# Patient Record
Sex: Female | Born: 1945 | Race: Black or African American | Hispanic: No | Smoking: Never smoker
Health system: Southern US, Community
[De-identification: ages and names within clinical notes are randomized; demographics above are authoritative.]

## PROBLEM LIST (undated history)

## (undated) DIAGNOSIS — I10 Essential (primary) hypertension: Secondary | ICD-10-CM

## (undated) DIAGNOSIS — R519 Headache, unspecified: Secondary | ICD-10-CM

## (undated) DIAGNOSIS — E785 Hyperlipidemia, unspecified: Secondary | ICD-10-CM

## (undated) DIAGNOSIS — K219 Gastro-esophageal reflux disease without esophagitis: Secondary | ICD-10-CM

## (undated) DIAGNOSIS — E119 Type 2 diabetes mellitus without complications: Secondary | ICD-10-CM

## (undated) DIAGNOSIS — E7800 Pure hypercholesterolemia, unspecified: Secondary | ICD-10-CM

## (undated) DIAGNOSIS — D649 Anemia, unspecified: Secondary | ICD-10-CM

## (undated) DIAGNOSIS — R011 Cardiac murmur, unspecified: Secondary | ICD-10-CM

## (undated) DIAGNOSIS — L02419 Cutaneous abscess of limb, unspecified: Secondary | ICD-10-CM

## (undated) DIAGNOSIS — R51 Headache: Secondary | ICD-10-CM

## (undated) DIAGNOSIS — L03119 Cellulitis of unspecified part of limb: Secondary | ICD-10-CM

## (undated) HISTORY — DX: Cellulitis of unspecified part of limb: L03.119

## (undated) HISTORY — PX: EYE SURGERY: SHX253

## (undated) HISTORY — DX: Hyperlipidemia, unspecified: E78.5

## (undated) HISTORY — DX: Pure hypercholesterolemia, unspecified: E78.00

## (undated) HISTORY — DX: Cellulitis of unspecified part of limb: L02.419

## (undated) HISTORY — DX: Headache, unspecified: R51.9

## (undated) HISTORY — DX: Cardiac murmur, unspecified: R01.1

## (undated) HISTORY — DX: Essential (primary) hypertension: I10

## (undated) HISTORY — DX: Anemia, unspecified: D64.9

## (undated) HISTORY — DX: Headache: R51

---

## 2001-01-06 ENCOUNTER — Ambulatory Visit (HOSPITAL_COMMUNITY): Admission: RE | Admit: 2001-01-06 | Discharge: 2001-01-06 | Payer: Self-pay | Admitting: Family Medicine

## 2001-01-06 ENCOUNTER — Encounter: Payer: Self-pay | Admitting: Family Medicine

## 2001-01-23 ENCOUNTER — Encounter (HOSPITAL_COMMUNITY): Admission: RE | Admit: 2001-01-23 | Discharge: 2001-02-22 | Payer: Self-pay | Admitting: Neurosurgery

## 2001-02-23 ENCOUNTER — Encounter (HOSPITAL_COMMUNITY): Admission: RE | Admit: 2001-02-23 | Discharge: 2001-03-25 | Payer: Self-pay | Admitting: Neurosurgery

## 2001-06-12 ENCOUNTER — Encounter: Payer: Self-pay | Admitting: Family Medicine

## 2001-06-12 ENCOUNTER — Ambulatory Visit (HOSPITAL_COMMUNITY): Admission: RE | Admit: 2001-06-12 | Discharge: 2001-06-12 | Payer: Self-pay | Admitting: Family Medicine

## 2002-05-14 ENCOUNTER — Emergency Department (HOSPITAL_COMMUNITY): Admission: EM | Admit: 2002-05-14 | Discharge: 2002-05-14 | Payer: Self-pay | Admitting: Emergency Medicine

## 2002-12-10 ENCOUNTER — Emergency Department (HOSPITAL_COMMUNITY): Admission: EM | Admit: 2002-12-10 | Discharge: 2002-12-10 | Payer: Self-pay | Admitting: Emergency Medicine

## 2004-08-29 ENCOUNTER — Emergency Department (HOSPITAL_COMMUNITY): Admission: EM | Admit: 2004-08-29 | Discharge: 2004-08-29 | Payer: Self-pay | Admitting: Emergency Medicine

## 2006-04-13 ENCOUNTER — Ambulatory Visit: Payer: Self-pay | Admitting: Gastroenterology

## 2006-04-13 ENCOUNTER — Ambulatory Visit (HOSPITAL_COMMUNITY): Admission: RE | Admit: 2006-04-13 | Discharge: 2006-04-13 | Payer: Self-pay | Admitting: Gastroenterology

## 2006-07-01 ENCOUNTER — Emergency Department (HOSPITAL_COMMUNITY): Admission: EM | Admit: 2006-07-01 | Discharge: 2006-07-01 | Payer: Self-pay | Admitting: Emergency Medicine

## 2007-07-10 ENCOUNTER — Emergency Department (HOSPITAL_COMMUNITY): Admission: EM | Admit: 2007-07-10 | Discharge: 2007-07-11 | Payer: Self-pay | Admitting: Emergency Medicine

## 2008-02-20 ENCOUNTER — Ambulatory Visit (HOSPITAL_COMMUNITY): Admission: RE | Admit: 2008-02-20 | Discharge: 2008-02-20 | Payer: Self-pay | Admitting: Family Medicine

## 2008-11-04 ENCOUNTER — Ambulatory Visit (HOSPITAL_COMMUNITY): Admission: RE | Admit: 2008-11-04 | Discharge: 2008-11-04 | Payer: Self-pay | Admitting: Family Medicine

## 2008-11-22 ENCOUNTER — Other Ambulatory Visit: Admission: RE | Admit: 2008-11-22 | Discharge: 2008-11-22 | Payer: Self-pay | Admitting: Obstetrics & Gynecology

## 2009-04-21 ENCOUNTER — Ambulatory Visit: Payer: Self-pay | Admitting: Orthopedic Surgery

## 2009-06-10 ENCOUNTER — Ambulatory Visit: Payer: Self-pay | Admitting: Orthopedic Surgery

## 2009-06-11 ENCOUNTER — Encounter (INDEPENDENT_AMBULATORY_CARE_PROVIDER_SITE_OTHER): Payer: Self-pay | Admitting: *Deleted

## 2009-06-27 ENCOUNTER — Ambulatory Visit: Payer: Self-pay | Admitting: Gastroenterology

## 2009-06-27 ENCOUNTER — Encounter: Payer: Self-pay | Admitting: Urgent Care

## 2009-06-27 DIAGNOSIS — D508 Other iron deficiency anemias: Secondary | ICD-10-CM | POA: Insufficient documentation

## 2009-06-27 DIAGNOSIS — E785 Hyperlipidemia, unspecified: Secondary | ICD-10-CM | POA: Insufficient documentation

## 2009-06-27 DIAGNOSIS — E119 Type 2 diabetes mellitus without complications: Secondary | ICD-10-CM

## 2009-06-27 DIAGNOSIS — D259 Leiomyoma of uterus, unspecified: Secondary | ICD-10-CM | POA: Insufficient documentation

## 2009-06-27 DIAGNOSIS — K219 Gastro-esophageal reflux disease without esophagitis: Secondary | ICD-10-CM

## 2009-06-27 LAB — CONVERTED CEMR LAB
Basophils Absolute: 0 10*3/uL (ref 0.0–0.1)
Basophils Relative: 0 % (ref 0–1)
Eosinophils Absolute: 0.1 10*3/uL (ref 0.0–0.7)
Eosinophils Relative: 2 % (ref 0–5)
Hemoglobin: 11 g/dL — ABNORMAL LOW (ref 12.0–15.0)
MCHC: 30.4 g/dL (ref 30.0–36.0)
MCV: 75.4 fL — ABNORMAL LOW (ref 78.0–100.0)
Monocytes Absolute: 0.5 10*3/uL (ref 0.1–1.0)
Monocytes Relative: 8 % (ref 3–12)
Neutro Abs: 3.9 10*3/uL (ref 1.7–7.7)
RDW: 14.7 % (ref 11.5–15.5)
Retic Ct Pct: 1 % (ref 0.4–3.1)
TIBC: 262 ug/dL (ref 250–470)

## 2009-06-30 ENCOUNTER — Ambulatory Visit: Payer: Self-pay | Admitting: Gastroenterology

## 2009-07-11 ENCOUNTER — Encounter: Payer: Self-pay | Admitting: Gastroenterology

## 2009-08-30 HISTORY — PX: OTHER SURGICAL HISTORY: SHX169

## 2009-09-29 ENCOUNTER — Ambulatory Visit: Payer: Self-pay | Admitting: Orthopedic Surgery

## 2009-09-29 DIAGNOSIS — M7580 Other shoulder lesions, unspecified shoulder: Secondary | ICD-10-CM

## 2009-09-29 DIAGNOSIS — M75120 Complete rotator cuff tear or rupture of unspecified shoulder, not specified as traumatic: Secondary | ICD-10-CM | POA: Insufficient documentation

## 2009-10-02 ENCOUNTER — Telehealth (INDEPENDENT_AMBULATORY_CARE_PROVIDER_SITE_OTHER): Payer: Self-pay | Admitting: *Deleted

## 2009-10-03 ENCOUNTER — Telehealth (INDEPENDENT_AMBULATORY_CARE_PROVIDER_SITE_OTHER): Payer: Self-pay | Admitting: *Deleted

## 2009-10-03 ENCOUNTER — Ambulatory Visit (HOSPITAL_COMMUNITY): Admission: RE | Admit: 2009-10-03 | Discharge: 2009-10-03 | Payer: Self-pay | Admitting: Orthopedic Surgery

## 2009-10-22 ENCOUNTER — Ambulatory Visit: Payer: Self-pay | Admitting: Orthopedic Surgery

## 2009-10-24 ENCOUNTER — Encounter (INDEPENDENT_AMBULATORY_CARE_PROVIDER_SITE_OTHER): Payer: Self-pay | Admitting: *Deleted

## 2009-10-31 ENCOUNTER — Ambulatory Visit: Payer: Self-pay | Admitting: Orthopedic Surgery

## 2009-10-31 ENCOUNTER — Inpatient Hospital Stay (HOSPITAL_COMMUNITY)
Admission: RE | Admit: 2009-10-31 | Discharge: 2009-11-02 | Payer: Self-pay | Source: Home / Self Care | Admitting: Orthopedic Surgery

## 2009-11-04 ENCOUNTER — Ambulatory Visit: Payer: Self-pay | Admitting: Orthopedic Surgery

## 2009-11-07 ENCOUNTER — Encounter: Payer: Self-pay | Admitting: Orthopedic Surgery

## 2009-11-12 ENCOUNTER — Ambulatory Visit: Payer: Self-pay | Admitting: Orthopedic Surgery

## 2009-11-14 ENCOUNTER — Encounter: Payer: Self-pay | Admitting: Orthopedic Surgery

## 2009-12-11 ENCOUNTER — Ambulatory Visit: Payer: Self-pay | Admitting: Orthopedic Surgery

## 2009-12-15 ENCOUNTER — Encounter (HOSPITAL_COMMUNITY): Admission: RE | Admit: 2009-12-15 | Discharge: 2010-01-14 | Payer: Self-pay | Admitting: Orthopedic Surgery

## 2009-12-15 ENCOUNTER — Encounter: Payer: Self-pay | Admitting: Orthopedic Surgery

## 2010-01-07 ENCOUNTER — Encounter: Payer: Self-pay | Admitting: Orthopedic Surgery

## 2010-01-08 ENCOUNTER — Ambulatory Visit: Payer: Self-pay | Admitting: Orthopedic Surgery

## 2010-01-15 ENCOUNTER — Encounter (HOSPITAL_COMMUNITY): Admission: RE | Admit: 2010-01-15 | Discharge: 2010-02-14 | Payer: Self-pay | Admitting: Orthopedic Surgery

## 2010-02-03 ENCOUNTER — Telehealth: Payer: Self-pay | Admitting: Orthopedic Surgery

## 2010-02-04 ENCOUNTER — Telehealth: Payer: Self-pay | Admitting: Orthopedic Surgery

## 2010-02-04 ENCOUNTER — Encounter: Payer: Self-pay | Admitting: Orthopedic Surgery

## 2010-02-16 ENCOUNTER — Encounter (HOSPITAL_COMMUNITY): Admission: RE | Admit: 2010-02-16 | Discharge: 2010-03-18 | Payer: Self-pay | Admitting: Orthopedic Surgery

## 2010-02-25 ENCOUNTER — Encounter: Payer: Self-pay | Admitting: Orthopedic Surgery

## 2010-02-26 ENCOUNTER — Ambulatory Visit: Payer: Self-pay | Admitting: Orthopedic Surgery

## 2010-03-19 ENCOUNTER — Encounter: Payer: Self-pay | Admitting: Orthopedic Surgery

## 2010-03-19 ENCOUNTER — Encounter (HOSPITAL_COMMUNITY): Admission: RE | Admit: 2010-03-19 | Discharge: 2010-04-18 | Payer: Self-pay | Admitting: Orthopedic Surgery

## 2010-04-02 ENCOUNTER — Ambulatory Visit: Payer: Self-pay | Admitting: Orthopedic Surgery

## 2010-05-25 ENCOUNTER — Emergency Department (HOSPITAL_COMMUNITY): Admission: EM | Admit: 2010-05-25 | Discharge: 2010-05-25 | Payer: Self-pay | Admitting: Emergency Medicine

## 2010-05-27 ENCOUNTER — Other Ambulatory Visit: Admission: RE | Admit: 2010-05-27 | Discharge: 2010-05-27 | Payer: Self-pay | Admitting: Obstetrics & Gynecology

## 2010-07-02 ENCOUNTER — Ambulatory Visit: Payer: Self-pay | Admitting: Orthopedic Surgery

## 2010-08-10 ENCOUNTER — Ambulatory Visit (HOSPITAL_COMMUNITY)
Admission: RE | Admit: 2010-08-10 | Discharge: 2010-08-10 | Payer: Self-pay | Source: Home / Self Care | Attending: Internal Medicine | Admitting: Internal Medicine

## 2010-09-29 NOTE — Assessment & Plan Note (Signed)
Summary: MRI RESULTS DISC IS HERE/BCBS/BSF   Visit Type:  Follow-up Referring Provider:  Dr. Lynett Fish Primary Provider:  [name]  CC:  right shoulder pain.  History of Present Illness: This is a follow up visit for this 65 year old right-hand-dominant female who had an injection ;. home exercise program which she has been done diligently, presents back with pain in the RIGHT shoulder with an aching sensation, painful for evaluation, weakness, and a catching sensation in the RIGHT shoulder.  Review of systems she has no neck pain or paresthesias in the RIGHT upper extremity.  MRI results  IMPRESSION:   1.  Large full-thickness retracted tear of the supraspinatus tendon. 2.  Intrasubstance tears of the musculotendinous junctions of the infraspinatus and supraspinatus. 3.  Glenohumeral joint effusion with fluid extending into the subacromial and subdeltoid bursal spaces.   Read By:  Gwynn Burly,  M.D.       Current Medications (verified): 1)  Crestor 10 Mg Tabs (Rosuvastatin Calcium) .... Take 1 Tab By Mouth At Bedtime 2)  Lisinopril 10 Mg Tabs (Lisinopril) .... Take 1 Tablet By Mouth Once A Day 3)  Ibuprofen 200 Mg Tabs (Ibuprofen) .... 2 By Mouth As Needed Usu Once Per Week 4)  Omeprazole 20 Mg Tbec (Omeprazole) .... One By Mouth Daily For Acid Reflux 5)  Norco 5-325 Mg Tabs (Hydrocodone-Acetaminophen) .Marland Kitchen.. 1 By Mouth Q 4 As Needed Pain  Allergies (verified): No Known Drug Allergies  Past History:  Past Medical History: Last updated: 06/27/2009 htn Hyperlipidemia Colonoscopy Dr. Cira Servant 04/13/2006->66mm asc colon polypectomy, unable to be retrieved, recommends repeat in 5 yrs DM ANEMIA, IRON DEFICIENCY, MICROCYTIC (ICD-280.8)  Uterine fibroids Dr Despina Hidden  Past Surgical History: Last updated: 06/27/2009 na Unremarkable  Family History: Last updated: 06/27/2009 No known family history of colorectal carcinoma, IBD, liver or chronic GI  problems. 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  Alcohol Use - no Illicit Drug Use - no Patient does not get regular exercise.   Risk Factors: Alcohol Use: 0 (04/21/2009) Caffeine Use: 1 cup per day (04/21/2009) Exercise: no (06/27/2009)  Risk Factors: Smoking Status: never (06/27/2009)  Review of Systems General:  Denies weight loss, weight gain, fever, chills, and fatigue. Cardiac :  Denies chest pain, angina, heart attack, heart failure, poor circulation, blood clots, and phlebitis. Resp:  Denies short of breath, difficulty breathing, COPD, cough, and pneumonia. GI:  Denies nausea, vomiting, diarrhea, constipation, difficulty swallowing, ulcers, GERD, and reflux. GU:  Denies kidney failure, kidney transplant, kidney stones, burning, poor stream, testicular cancer, blood in urine, and . Neuro:  Denies headache, dizziness, migraines, numbness, weakness, tremor, and unsteady walking. MS:  See HPI. Endo:  Denies thyroid disease, goiter, and diabetes. Psych:  Denies depression, mood swings, anxiety, panic attack, bipolar, and schizophrenia. Derm:  Denies eczema, cancer, and itching. EENT:  Denies poor vision, cataracts, glaucoma, poor hearing, vertigo, ears ringing, sinusitis, hoarseness, toothaches, and bleeding gums. Immunology:  has been worked up for anemia with Dr. Aura Fey grp. Lymphatic:  Denies lymph node cancer and lymph edema.  Physical Exam  Additional Exam:  PHYSICAL EXAMINATION    GENERAL Overall appearance was normal MEDIUM BUILD   NEURO: she was oriented times x 3 and her mood was flat; Sensation is normal.  Reflexes normal.  Coordination and balance were normal.  LYMPH NODES WERE NORMAL   Gait and station were  normal.    Her RIGHT upper extremity was nontender but had painful range of motion especially in forward elevation abduction.  The shoulder was stable.  She had weakness of the supraspinous tendon.  Internal/external rotators were normal.    Skin is intact.    CDV: Pulses were normal.           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 I have answered their questions. The risks of bleeding, infection, nerve and vascualr injury have been discussed. The diagnosis and reason for surgery have been explained. The patient demonstrates understanding of this discussion. Specific to this procedure risks include:   2)  stiiffness 3)  pain  4)  retear 5)  weakness  6)  you will need 6 mos of rehabilitation and therapy 7)  Surgery 10/31/09 8)  I will call you with your preop time and date, take packet with you 9)  schedule post op appt

## 2010-09-29 NOTE — Miscellaneous (Signed)
Summary: Pre-auth request for in-patient surgery  Clinical Lists Changes  Contacted insurer [employer] @ph  [phone] re: scheduled surgery, "inpt/observation"   CPT 23412, ICD9 727.61. Per Dorann Lodge, fax clinicals to nurse reviewer at Fax [phone]. Done. REF# given [id number].   10/29/09 rec'd call back from Jan in utilization, they have approved for ambulatory/observation with this Pre-authorization #.

## 2010-09-29 NOTE — Letter (Signed)
Summary: Surgery order RT shoulder  Surgery order RT shoulder   Imported By: Cammie Sickle 12/20/2009 11:09:48  _____________________________________________________________________  External Attachment:    Type:   Image     Comment:   External Document
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
## 2010-09-29 NOTE — Assessment & Plan Note (Signed)
Summary: RT SHOULDER HURTING/HAD XR AUG'2010/[employer]BS/CAF   Visit Type:  Follow-up Referring Provider:  Dr. Lynett Vasquez Primary Provider:  [name]  CC:  right shoulder pain.  History of Present Illness: This is a follow up visit for this 65 year old right-hand-dominant female who had an injection M.D. home exercise program which she has been diligently presents back with pain in the RIGHT shoulder with an aching sensation, painful for evaluation, weakness, and a catching sensation in the RIGHT shoulder.  Review of systems she has no neck pain or paresthesias in the RIGHT upper extremity.    Allergies: No Known Drug Allergies  Past History:  Past Medical History: Last updated: 06/27/2009 htn Hyperlipidemia Colonoscopy Dr. Cira Vasquez 04/13/2006->80mm asc colon polypectomy, unable to be retrieved, recommends repeat in 5 yrs DM ANEMIA, IRON DEFICIENCY, MICROCYTIC (ICD-280.8)  Uterine fibroids Dr Ann Vasquez  Review of Systems      See HPI  Physical Exam  Additional Exam:  examination   overall appearance was normal   she was oriented times x3 and her mood was flat    Gait and station were normal.    Her RIGHT upper extremity was nontender but had painful range of motion especially in forward elevation abduction.  The shoulder was stable.  She had weakness of the supraspinous tendon.  Internal/external rotators were normal.    Skin is intact.    Pulses were normal.    HeLymph nodes were normal.    Sensation is normal.    Reflexes normal.    coordination and balance were normal.   Impression & Recommendations:  Problem # 1:  RUPTURE ROTATOR CUFF (ICD-727.61) Assessment New  exacerbation of impingement syndrome possible rotator cuff tear  Recommend injection for pain, pain medication  MRI.  Inject RIGHT shoulder Verbal consent obtained/The shoulder was injected with depomedrol 40mg /cc and sensorcaine .25% . There were no complications  Orders: Est.  Patient Level IV (04540) Depo- Medrol 40mg  (J1030) Joint Aspirate / Injection, Large (98119)  Problem # 2:  IMPINGEMENT SYNDROME (ICD-726.2) Assessment: Deteriorated  Orders: Est. Patient Level IV (14782) Depo- Medrol 40mg  (J1030) Joint Aspirate / Injection, Large (20610)  Medications Added to Medication List This Visit: 1)  Norco 5-325 Mg Tabs (Hydrocodone-acetaminophen) .Marland Kitchen.. 1 by mouth q 4 as needed pain  Patient Instructions: 1)  MRI right shoulder/Rotator cuff tear f/u here 2)  Injection for pain  3)  pain medication 4)  You have received an injection of cortisone today. You may experience increased pain at the injection site. Apply ice pack to the area for 20 minutes every 2 hours and take 2 xtra strength tylenol every 8 hours. This increased pain will usually resolve in 24 hours. The injection will take effect in 3-10 days.  Prescriptions: NORCO 5-325 MG TABS (HYDROCODONE-ACETAMINOPHEN) 1 by mouth q 4 as needed pain  #42 x 1   Entered and Authorized by:   Ann Canada MD   Signed by:   Ann Canada MD on 09/29/2009   Method used:   Print then Give to Patient   RxID:   [id number] NORCO 5-325 MG TABS (HYDROCODONE-ACETAMINOPHEN) 1 by mouth q 4 as needed pain  #42 x 1   Entered and Authorized by:   Ann Canada MD   Signed by:   Ann Canada MD on 09/29/2009   Method used:   Print then Give to Patient   RxID:   [id number]

## 2010-09-29 NOTE — Miscellaneous (Signed)
Summary: OT Progress note  OT Progress note   Imported By: Jacklynn Ganong 01/12/2010 14:56:27  _____________________________________________________________________  External Attachment:    Type:   Image     Comment:   External Document

## 2010-09-29 NOTE — Progress Notes (Signed)
Summary: can you extend OOW note?  Phone Note Call from Patient   Summary of Call: Ann Vasquez (May 27, 2046) asked if you will change her work note to say out of work thru June 10,2011.  Current note was written for OOW 12/12/09 - 01/28/10.  If ok'd fax to :  [employer] / [facility]  fax # [phone] [employer] # [phone] Initial call taken by: Jacklynn Ganong,  February 03, 2010 2:07 PM  Follow-up for Phone Call        yes    Follow-up by: Fuller Canada MD,  February 04, 2010 9:06 AM  Additional Follow-up for Phone Call Additional follow up Details #1::        Left a message for the patient to callour office Additional Follow-up by: Jacklynn Ganong,  February 04, 2010 10:24 AM    Additional Follow-up for Phone Call Additional follow up Details #2::    advised patientof reply  Additional Follow-up for Phone Call Additional follow up Details #3:: Additional Follow-up by: Jacklynn Ganong,  February 04, 2010 10:32 AM

## 2010-09-29 NOTE — Assessment & Plan Note (Signed)
Summary: F/U RT SHOULDER SURG 10/31/09/BSF   Referring Provider:  Dr. Lynett Fish Primary Provider:  Rosey Bath Hurst/ Cresenzo   History of Present Illness: This is a 65 year old female who is status post open rotator cuff repair for multiple tendon injury with significant retraction surgery was in March of this year.  She complains of some soreness but has been able to increase her activities at home.  Her therapy as been completed she's been discharged  She has full forward elevation some mild scapular dyskinesia and good showing tear rotator cuff  Recommendation continue home exercises work on strength returned to work September 6 follow up me in 3 months  Allergies: No Known Drug Allergies   Other Orders: Post-Op Check (60109)  Patient Instructions: 1)  Continue exercises at home  2)  extend OOW note until Sept 6th to RTW  3)  f/u in 3 months

## 2010-09-29 NOTE — Assessment & Plan Note (Signed)
Summary: 8 WK RE-CK RC/POST OP,SURG 10/31/09/[employer]/CAF   Visit Type:  Follow-up Referring Provider:  Dr. Lynett Fish Primary Provider:  [name]  CC:  recheck shoulder.  History of Present Illness:  status post open rotator cuff repair of a massive tear of the RIGHT rotator cuff  DOS 10-31-09.  Medications: Ibuprofen 800mg  as needed.  Recheck after physical therapy.  Today is 8 week recheck right shoulder after PT.  Still some anterior shoulder pain with alot of use of the right arm.  Still in PT.  Progress note from yesterday on the way, [facility].  Ann Vasquez is doing very well status post her open rotator cuff repair for massive tear  Active range of motion flexion 170 abduction 170 external rotation 90 internal rotation 70  Main problem right now is unable to do her hair  She has some pain in the anterior deltoid but this is strain from cuff deltoid asynchronously and scapular thoracic rhythm dysfunction with sugar therapy    Allergies: No Known Drug Allergies   Impression & Recommendations:  Problem # 1:  AFTERCARE FOLLOW SURGERY MUSCULOSKEL SYSTEM NEC (ICD-V58.78)  concerned about going back to work August 15 I will see her on August 1  Continue physical therapy for now  Orders: Est. Patient Level II (45409)  Problem # 2:  RUPTURE ROTATOR CUFF (ICD-727.61)  Orders: Est. Patient Level II (81191)  Complete Medication List: 1)  Crestor 10 Mg Tabs (Rosuvastatin calcium) .... Take 1 tab by mouth at bedtime 2)  Lisinopril 10 Mg Tabs (lisinopril)  .... Take 1 tablet by mouth once a day 3)  Ibuprofen 200 Mg Tabs (Ibuprofen) .... 2 by mouth as needed usu once per week 4)  Omeprazole 20 Mg Tbec (Omeprazole) .... One by mouth daily for acid reflux 5)  Norco 5-325 Mg Tabs (Hydrocodone-acetaminophen) .Marland Kitchen.. 1 by mouth q 4 as needed pain  Patient Instructions: 1)  F/U 1 st August

## 2010-09-29 NOTE — Progress Notes (Signed)
Summary: no answer when calling to remind of MRI appt  Phone Note Outgoing Call Call back at [facility] Phone [phone] Call back at Work Phone [phone]   Summary of Call: tried calling patient numerous times to let her know of her MRI appt, no answer left messages. Initial call taken by: Ether Griffins,  October 02, 2009 3:27 PM

## 2010-09-29 NOTE — Assessment & Plan Note (Signed)
Summary: 3 M RE-CK RT SHOULDER/SURG 10/31/09/BCBS/CAF   Visit Type:  Follow-up Referring Provider:  Dr. Lynett Fish Primary Provider:  [name]  CC:  recheck rt shoulder.  History of Present Illness:     She has had an open rotator cuff repair for multiple tendon tear with significant retraction March of this year she is fully recovered and she is at work.  She says she does get some tightness and soreness when she does her hair  She has full forward elevation and abduction as well as external rotation and very adequate strength in the supraspinatus tendon as well as the external rotators  I'm very pleased with her function at this point and she is released.  Allergies: No Known Drug Allergies   Impression & Recommendations:  Problem # 1:  RUPTURE ROTATOR CUFF (ICD-727.61) Assessment Improved  Orders: Post-Op Check (95621)  Problem # 2:  AFTERCARE FOLLOW SURGERY MUSCULOSKEL SYSTEM NEC (ICD-V58.78) Assessment: Improved  Orders: Post-Op Check (30865)  Patient Instructions: 1)  Please schedule a follow-up appointment as needed.   Orders Added: 1)  Post-Op Check [78469]

## 2010-09-29 NOTE — Miscellaneous (Signed)
Summary: OT progress note  OT progress note   Imported By: Jacklynn Ganong 02/26/2010 14:57:49  _____________________________________________________________________  External Attachment:    Type:   Image     Comment:   External Document

## 2010-09-29 NOTE — Progress Notes (Signed)
Summary: faxed revised oow note  Phone Note Other Incoming   Summary of Call: I faxed a revised out of work note to [employer] at [facility] system/Central Office at fax # [phone] per patient's request Initial call taken by: Jacklynn Ganong,  February 04, 2010 11:19 AM

## 2010-09-29 NOTE — Letter (Signed)
Summary: Revised out of work note  [facility]  [address]  [place], [place] [address]   Phone: [phone]  Fax: [phone]    February 04, 2010   Employee:  Ann Vasquez    To Whom It May Concern:   For Medical reasons, please excuse the above named employee from work for the following dates:  Start:   12-12-2009  Thru:    02-06-2010    If you need additional information, please feel free to contact our office.         Sincerely,   Dr. Vickki Hearing

## 2010-09-29 NOTE — Miscellaneous (Signed)
Summary: OT Clinical evaluation  OT Clinical evaluation   Imported By: Jacklynn Ganong 01/08/2010 16:10:40  _____________________________________________________________________  External Attachment:    Type:   Image     Comment:   External Document

## 2010-09-29 NOTE — Assessment & Plan Note (Signed)
Summary: 4 WK RE-CK RC/POST OP 10/31/09/[employer]BS/CAF   Visit Type:  post op Referring Provider:  Dr. Lynett Fish Primary Provider:  Rosey Bath Hurst/ Cresenzo   History of Present Illness:   10 weeks   status post open rotator cuff repair of a massive tear of the RIGHT rotator cuff  DOS 10-31-09.  Medications: Ibuprofen 800mg  as needed.  Recheck after physical therapy.  She is progressing nicely in therapy and was able to flex her arm 120 in the scapular plane, abducted 90.  Externally rotate 40.  Continue therapy follow up in 8 weeks  Allergies: No Known Drug Allergies   Impression & Recommendations:  Problem # 1:  AFTERCARE FOLLOW SURGERY MUSCULOSKEL SYSTEM NEC (ICD-V58.78) Assessment Improved  Orders: Post-Op Check (86578)  Problem # 2:  RUPTURE ROTATOR CUFF (ICD-727.61) Assessment: Improved  Orders: Post-Op Check (46962)  Patient Instructions: 1)  8 weeks f/u  2)  continue physical therapy

## 2010-09-29 NOTE — Progress Notes (Signed)
Summary: missed MRI appt  Phone Note Outgoing Call Call back at [facility] Phone [phone]   Summary of Call: called patient to see if she went for  MRI no answer, i left message on mahine that if she wishes to proceed with mri she will need to come by our office and pick up order form to schedule her own appt, we have made multiple calls to her numbers. Initial call taken by: Ether Griffins,  October 03, 2009 11:04 AM

## 2010-09-29 NOTE — Letter (Signed)
Summary: FMLA form  FMLA form   Imported By: Cammie Sickle 12/20/2009 11:09:06  _____________________________________________________________________  External Attachment:    Type:   Image     Comment:   External Document

## 2010-09-29 NOTE — Miscellaneous (Signed)
Summary: OT proegree note  OT proegree note   Imported By: Jacklynn Ganong 02/17/2010 10:12:18  _____________________________________________________________________  External Attachment:    Type:   Image     Comment:   External Document

## 2010-09-29 NOTE — Letter (Signed)
Summary: Disability form [employer]  Disability form [employer]   Imported By: Cammie Sickle 12/20/2009 11:08:11  _____________________________________________________________________  External Attachment:    Type:   Image     Comment:   External Document

## 2010-09-29 NOTE — Miscellaneous (Signed)
Summary: OT progress note  OT progress note   Imported By: Jacklynn Ganong 03/23/2010 08:40:05  _____________________________________________________________________  External Attachment:    Type:   Image     Comment:   External Document

## 2010-09-29 NOTE — Letter (Signed)
Summary: Out of Work  [facility]  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         Sincerely,    Terrance Mass, MD

## 2010-09-29 NOTE — Letter (Signed)
Summary: Work [facility]  [address]  [place], [place] [address]   Phone: [phone]  Fax: [phone]     Today's Date: October 22, 2009  Name of Patient: Ann Vasquez  The above named patient had a medical visit today at:  8:30am  Please take this into consideration when reviewing the time away from work.    Special Instructions:  [ X ] To be return to the normal work schedule today, October 22, 2009, following appointment today.   [ X ] To be out of work, secondary to surgery as follows:  Start Date:  October 31, 2009  End Date:  December 12, 2009  Estimated Return to work date/approximate:  April 18, 2011______________________.    Sincerely,   Terrance Mass, MD

## 2010-09-29 NOTE — Letter (Signed)
Summary: Out of Work  [facility]  [address]  [place], [place] [address]   Phone: [phone]  Fax: [phone]    December 11, 2009   Employee:  JUELZ CLAAR    To Whom It May Concern:   For Medical reasons, please excuse the above named employee from work for the following dates: [extension]  Start:   April 15th   End:   June 1st   If you need additional information, please feel free to contact our office.         Sincerely,    Fuller Canada MD

## 2010-09-29 NOTE — Assessment & Plan Note (Signed)
Summary: POST OP 1/RT SHOULDER/SURG 10/31/09/[employer]BS/CAF   Visit Type:  post op #1 Referring Provider:  Dr. Lynett Fish Primary Provider:  [name]  CC:  right shoulder .  History of Present Illness: I saw Ann Vasquez in the office today for a followup visit.  She is a 65 years old woman with the complaint of:  POST OP #1, RIGHT SHOULDER.  Postop day #4  DOS 10-31-09. Open right rotator cuff repair, right shoulder.  Doing well  Incision looks good  Sling applied  Return in one week with suture no therapy for 6 weeks    Allergies: No Known Drug Allergies   Impression & Recommendations:  Problem # 1:  RUPTURE ROTATOR CUFF (ICD-727.61) Assessment Comment Only  Orders: Post-Op Check (32355)  Problem # 2:  AFTERCARE FOLLOW SURGERY MUSCULOSKEL SYSTEM NEC (ICD-V58.78) Assessment: Comment Only  Orders: Post-Op Check (73220)  Patient Instructions: 1)  Please schedule a follow-up appointment in 1 week. 2)  clip suture

## 2010-09-29 NOTE — Assessment & Plan Note (Signed)
Summary: 4 WK RE-CK/RT SHOULDER,POST OP 10/31/09/BCBS/CAF   Visit Type:  Follow-up Referring Provider:  Dr. Lynett Fish Primary Provider:  [name]  CC:  post op shoulder.  History of Present Illness:  status post open rotator cuff repair of a massive tear of the RIGHT rotator cuff  DOS 10-31-09.  Medications: Ibuprofen 800mg  and Norco 5 as needed only.  Today is recheck right shoulder after wearing sling and hand exercises with ball.  Still soreness.  Has not started PT.   Allergies: No Known Drug Allergies   Impression & Recommendations:  Problem # 1:  AFTERCARE FOLLOW SURGERY MUSCULOSKEL SYSTEM NEC (ICD-V58.78) Assessment Comment Only  Orders: Physical Therapy Referral (PT) Post-Op Check ([address])  Problem # 2:  RUPTURE ROTATOR CUFF (ICD-727.61) Assessment: Comment Only  Orders: Physical Therapy Referral (PT) Post-Op Check (60454)  Patient Instructions: 1)  start physical therapy. 2)  Return in 6 weeks

## 2010-11-12 LAB — DIFFERENTIAL
Basophils Absolute: 0 10*3/uL (ref 0.0–0.1)
Eosinophils Absolute: 0.1 10*3/uL (ref 0.0–0.7)
Eosinophils Relative: 2 % (ref 0–5)
Lymphocytes Relative: 31 % (ref 12–46)
Monocytes Absolute: 0.3 10*3/uL (ref 0.1–1.0)

## 2010-11-12 LAB — CBC
HCT: 36 % (ref 36.0–46.0)
MCH: 23.6 pg — ABNORMAL LOW (ref 26.0–34.0)
MCHC: 31.7 g/dL (ref 30.0–36.0)
MCV: 74.4 fL — ABNORMAL LOW (ref 78.0–100.0)
Platelets: 205 10*3/uL (ref 150–400)
RDW: 14.3 % (ref 11.5–15.5)

## 2010-11-12 LAB — BASIC METABOLIC PANEL
BUN: 7 mg/dL (ref 6–23)
CO2: 26 mEq/L (ref 19–32)
Chloride: 109 mEq/L (ref 96–112)
Creatinine, Ser: 0.6 mg/dL (ref 0.4–1.2)
Glucose, Bld: 111 mg/dL — ABNORMAL HIGH (ref 70–99)
Potassium: 4.2 mEq/L (ref 3.5–5.1)

## 2010-11-20 LAB — DIFFERENTIAL
Basophils Relative: 0 % (ref 0–1)
Eosinophils Absolute: 0.1 10*3/uL (ref 0.0–0.7)
Lymphs Abs: 1.8 10*3/uL (ref 0.7–4.0)
Neutro Abs: 3.3 10*3/uL (ref 1.7–7.7)
Neutrophils Relative %: 60 % (ref 43–77)

## 2010-11-20 LAB — BASIC METABOLIC PANEL
BUN: 9 mg/dL (ref 6–23)
Calcium: 10.1 mg/dL (ref 8.4–10.5)
Chloride: 107 mEq/L (ref 96–112)
Creatinine, Ser: 0.63 mg/dL (ref 0.4–1.2)
GFR calc Af Amer: >60 mL/min (ref >60)

## 2010-11-20 LAB — PROTIME-INR
INR: 1.07 (ref 0.00–1.49)
Prothrombin Time: 13.8 seconds (ref 11.6–15.2)

## 2010-11-20 LAB — CBC
MCHC: 32.5 g/dL (ref 30.0–36.0)
MCV: 75.4 fL — ABNORMAL LOW (ref 78.0–100.0)
Platelets: 214 10*3/uL (ref 150–400)
RBC: 4.71 MIL/uL (ref 3.87–5.11)
WBC: 5.5 10*3/uL (ref 4.0–10.5)

## 2010-11-20 LAB — APTT: aPTT: 37 seconds (ref 24–37)

## 2011-01-06 ENCOUNTER — Ambulatory Visit (INDEPENDENT_AMBULATORY_CARE_PROVIDER_SITE_OTHER): Payer: BC Managed Care – PPO | Admitting: Orthopedic Surgery

## 2011-01-06 ENCOUNTER — Encounter: Payer: Self-pay | Admitting: Orthopedic Surgery

## 2011-01-06 ENCOUNTER — Ambulatory Visit: Payer: Self-pay | Admitting: Orthopedic Surgery

## 2011-01-06 VITALS — HR 70 | Resp 16 | Ht 59.0 in | Wt 138.0 lb

## 2011-01-06 DIAGNOSIS — M542 Cervicalgia: Secondary | ICD-10-CM

## 2011-01-06 DIAGNOSIS — M25519 Pain in unspecified shoulder: Secondary | ICD-10-CM

## 2011-01-06 MED ORDER — HYDROCODONE-ACETAMINOPHEN 5-325 MG PO TABS: 1.0000 | ORAL_TABLET | Freq: Four times a day (QID) | ORAL | Status: AC | PRN

## 2011-01-06 MED ORDER — PREDNISONE (PAK) 10 MG PO TABS: ORAL_TABLET | ORAL | Status: DC

## 2011-01-06 MED ORDER — GABAPENTIN 100 MG PO CAPS: ORAL_CAPSULE | ORAL | Status: DC

## 2011-01-06 NOTE — Discharge Summary (Signed)
Separate identifiable. X-ray report AP, lateral, LEFT shoulder and AP, lateral, of cervical spine.  LEFT shoulder film shows normal glenohumeral joint with type II acromion.  Cervical spine show straightening of the cervical curvature with mild degenerative changes in the C4-C5 and C5-C6. Areas. 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

## 2011-01-06 NOTE — Progress Notes (Signed)
A chief complaint LEFT shoulder pain.  87, female, status post RIGHT rotator cuff repair, presents with 3 months of pain in the LEFT shoulder, radiating down into the LEFT hand at times, associated with numbness and tingling. She also feels pain around the deltoid and trapezius area also in the rotator interval with anterior shoulder pain. She reports locking and catching and a heavy weight sitting on the LEFT shoulder. She has painful forward elevation as well.  The pain is moderate, 6/10., quality is sharp throbbing and burning, timing is intermittent.  Review of systems is completed and the only complaints are of snoring, heartburn, and nausea.  Social habits are negative for smoking and drinking, or street drugs, and the education level is 312 completed.   General: The patient is normally developed, with normal grooming and hygiene. There are no gross deformities. The body habitus is normal   CDV: The pulse and perfusion of the extremities are normal   LYMPH: There is no gross lymphadenopathy in the extremities   Skin: There are no rashes, ulcers or cafe-au-lait spot   Psyche: The patient is alert, awake and oriented.  Mood is normal   Neuro:  The coordination and balance are normal.  Sensation is normal. Reflexes are 2+ and equal   Musculoskeletal  The cervical spine is tender, but the range of motion in the cervical spine is normal. There is some tightness in the LEFT trapezius muscle, There is painful forward elevation in crepitance with pain and 150, impingement sign is positive. Rotator cuff strength is 5 for 5 over manual muscle testing. The shoulder is otherwise stable.  Radiographs were obtained of the cervical spine and of the shoulder. The LEFT shoulder. X-ray shows no abnormality in the glenohumeral joint. The bone quality is normal. The film is hot lighted in the acromion is type II. As far as the cervical spine x-rays go.  Impression bursitis and tendinitis LEFT  shoulder.  Cervicalgia.  Treatment plan Recommend subacromial injection, LEFT shoulder Prednisone Dosepak. Hydrocodone pain medication, and Neurontin 2 week followup

## 2011-01-15 NOTE — Op Note (Signed)
Ann Vasquez, Ann Vasquez           ACCOUNT NO.:  [id number]   MEDICAL RECORD NO.:  [id number]          PATIENT TYPE:  AMB   LOCATION:  DAY                           FACILITY:  [facility]   PHYSICIAN:  Kassie Mends, M.D.      DATE OF BIRTH:  1945/12/18   DATE OF PROCEDURE:  04/13/2006  DATE OF DISCHARGE:                                 OPERATIVE REPORT   REFERRING PHYSICIAN:  Patrica Duel, M.D.   PROCEDURE:  Colonoscopy with snare polypectomy.   MEDICATIONS:  1. Demerol 75 mg IV.  2. Versed 3 mg IV.   INDICATION FOR EXAM:  Ann Vasquez is a 65 year old female who presents for  average risk colon cancer screening.   FINDINGS:  1. 6 mm sessile ascending colon polyp removed via snare cautery (200/25).      The polyp was unable to be retrieved.  Otherwise, no inflammatory      changes, masses or vascular ectasia seen.  2. Normal retroflexed view of the rectum.   RECOMMENDATIONS:  Repeat colonoscopy in 5 years.  First degree relatives  should have screening colonoscopy beginning at age 44.  Follow up with Dr.  Elpidio Eric.   PROCEDURE TECHNIQUE:  Physical exam was performed.  Informed consent was  obtained from the patient after explaining the risks, benefits and  alternatives.  The patient was placed in the left lateral position.  Continuous oxygen was provided by nasal cannula and IV medicine administered  through an indwelling cannula.  After administration of sedation and rectal  exam, the patient's rectum was intubated and the scope was advanced under  direct visualization to the cecum.  The scope was subsequently removed  slowly by carefully examining the color, texture, anatomy and integrity of  the mucosa on the way out.  The patient was recovered in the endoscopy suite  and discharged to home in satisfactory condition.      Kassie Mends, M.D.  Electronically Signed     SM/MEDQ  D:  04/13/2006  T:  04/13/2006  Job:  [id number]   cc:   Patrica Duel, M.D.  Fax: [phone]

## 2011-01-26 ENCOUNTER — Encounter: Payer: Self-pay | Admitting: Orthopedic Surgery

## 2011-01-26 ENCOUNTER — Ambulatory Visit (INDEPENDENT_AMBULATORY_CARE_PROVIDER_SITE_OTHER): Payer: BC Managed Care – PPO | Admitting: Orthopedic Surgery

## 2011-01-26 DIAGNOSIS — M549 Dorsalgia, unspecified: Secondary | ICD-10-CM

## 2011-01-26 MED ORDER — IBUPROFEN 800 MG PO TABS: 800.0000 mg | ORAL_TABLET | Freq: Three times a day (TID) | ORAL | Status: AC | PRN

## 2011-01-26 NOTE — Patient Instructions (Signed)
Rest   Heat   Take ibuprofen 800 mg 3 times a day

## 2011-01-26 NOTE — Progress Notes (Signed)
Recently treated for neck and shoulder pain with injection or medications, which she did not really tolerate that her shoulder and neck improve.  Since Saturday started having lower back pain with radiation bilaterally into the backs of both legs down to her knees.  No red flags in terms of bowel or bladder dysfunction, night sweats, or weight loss.  Exam shows tenderness in the lower lumbar spine with equal reflexes are 2+ negative sitting straight leg raises.  Impression lumbar pain.  Recommend physical therapy, ibuprofen, and Thermacare Heat wraps   Follow up 4 weeks

## 2011-02-12 ENCOUNTER — Ambulatory Visit (HOSPITAL_COMMUNITY)
Admission: RE | Admit: 2011-02-12 | Discharge: 2011-02-12 | Disposition: A | Discharge status: Home / Self Care | Payer: BC Managed Care – PPO | Source: Ambulatory Visit | Attending: Orthopedic Surgery | Admitting: Orthopedic Surgery

## 2011-02-12 DIAGNOSIS — M545 Low back pain, unspecified: Secondary | ICD-10-CM | POA: Insufficient documentation

## 2011-02-12 DIAGNOSIS — I10 Essential (primary) hypertension: Secondary | ICD-10-CM | POA: Insufficient documentation

## 2011-02-12 DIAGNOSIS — IMO0001 Reserved for inherently not codable concepts without codable children: Secondary | ICD-10-CM | POA: Insufficient documentation

## 2011-02-12 DIAGNOSIS — M6281 Muscle weakness (generalized): Secondary | ICD-10-CM | POA: Insufficient documentation

## 2011-02-15 ENCOUNTER — Ambulatory Visit (HOSPITAL_COMMUNITY)
Admission: RE | Admit: 2011-02-15 | Discharge: 2011-02-15 | Disposition: A | Discharge status: Home / Self Care | Payer: BC Managed Care – PPO | Source: Ambulatory Visit | Attending: Internal Medicine | Admitting: Internal Medicine

## 2011-02-17 ENCOUNTER — Ambulatory Visit (HOSPITAL_COMMUNITY)
Admission: RE | Admit: 2011-02-17 | Discharge: 2011-02-17 | Disposition: A | Discharge status: Home / Self Care | Payer: BC Managed Care – PPO | Source: Ambulatory Visit | Attending: Internal Medicine | Admitting: Internal Medicine

## 2011-02-22 ENCOUNTER — Ambulatory Visit (HOSPITAL_COMMUNITY)
Admission: RE | Admit: 2011-02-22 | Discharge: 2011-02-22 | Disposition: A | Discharge status: Home / Self Care | Payer: BC Managed Care – PPO | Source: Ambulatory Visit | Attending: Internal Medicine | Admitting: Internal Medicine

## 2011-02-23 ENCOUNTER — Ambulatory Visit (INDEPENDENT_AMBULATORY_CARE_PROVIDER_SITE_OTHER): Payer: BC Managed Care – PPO | Admitting: Orthopedic Surgery

## 2011-02-23 DIAGNOSIS — M549 Dorsalgia, unspecified: Secondary | ICD-10-CM

## 2011-02-23 NOTE — Patient Instructions (Signed)
Come back as needed

## 2011-02-23 NOTE — Progress Notes (Signed)
   Treated for lumbar spine pain also neck pain and also status post shoulder repair  Patient having no problems or symptoms in her back can flex and extend without difficulty.  The neck rotates normally.  She has full forward elevation of the shoulder although she has some difficulties with fatigue with forward elevation  She is discharged and followup as needed with improvement in all symptoms

## 2011-02-24 ENCOUNTER — Ambulatory Visit (HOSPITAL_COMMUNITY)
Admission: RE | Admit: 2011-02-24 | Discharge: 2011-02-24 | Disposition: A | Discharge status: Home / Self Care | Payer: BC Managed Care – PPO | Source: Ambulatory Visit | Attending: Internal Medicine | Admitting: Internal Medicine

## 2011-03-01 ENCOUNTER — Ambulatory Visit (HOSPITAL_COMMUNITY)
Admission: RE | Admit: 2011-03-01 | Discharge: 2011-03-01 | Disposition: A | Discharge status: Home / Self Care | Payer: BC Managed Care – PPO | Source: Ambulatory Visit | Attending: Internal Medicine | Admitting: Internal Medicine

## 2011-03-01 DIAGNOSIS — M6281 Muscle weakness (generalized): Secondary | ICD-10-CM | POA: Insufficient documentation

## 2011-03-01 DIAGNOSIS — IMO0001 Reserved for inherently not codable concepts without codable children: Secondary | ICD-10-CM | POA: Insufficient documentation

## 2011-03-01 DIAGNOSIS — M545 Low back pain, unspecified: Secondary | ICD-10-CM | POA: Insufficient documentation

## 2011-03-01 DIAGNOSIS — I10 Essential (primary) hypertension: Secondary | ICD-10-CM | POA: Insufficient documentation

## 2011-03-02 ENCOUNTER — Ambulatory Visit (HOSPITAL_COMMUNITY): Payer: BC Managed Care – PPO

## 2011-03-08 ENCOUNTER — Ambulatory Visit (HOSPITAL_COMMUNITY): Payer: BC Managed Care – PPO | Admitting: *Deleted

## 2011-03-11 ENCOUNTER — Ambulatory Visit (HOSPITAL_COMMUNITY)
Admission: RE | Admit: 2011-03-11 | Discharge: 2011-03-11 | Disposition: A | Discharge status: Home / Self Care | Payer: BC Managed Care – PPO | Source: Ambulatory Visit | Attending: Internal Medicine | Admitting: Internal Medicine

## 2011-07-09 ENCOUNTER — Other Ambulatory Visit (HOSPITAL_COMMUNITY): Payer: Self-pay | Admitting: Family Medicine

## 2011-07-09 DIAGNOSIS — R52 Pain, unspecified: Secondary | ICD-10-CM

## 2011-07-28 ENCOUNTER — Ambulatory Visit (HOSPITAL_COMMUNITY)
Admission: RE | Admit: 2011-07-28 | Discharge: 2011-07-28 | Disposition: A | Discharge status: Home / Self Care | Payer: BC Managed Care – PPO | Source: Ambulatory Visit | Attending: Family Medicine | Admitting: Family Medicine

## 2011-07-28 ENCOUNTER — Other Ambulatory Visit (HOSPITAL_COMMUNITY): Payer: Self-pay | Admitting: Family Medicine

## 2011-07-28 DIAGNOSIS — R52 Pain, unspecified: Secondary | ICD-10-CM

## 2011-07-28 DIAGNOSIS — Z139 Encounter for screening, unspecified: Secondary | ICD-10-CM

## 2011-08-16 ENCOUNTER — Ambulatory Visit (HOSPITAL_COMMUNITY)
Admission: RE | Admit: 2011-08-16 | Discharge: 2011-08-16 | Disposition: A | Discharge status: Home / Self Care | Payer: BC Managed Care – PPO | Source: Ambulatory Visit | Attending: Family Medicine | Admitting: Family Medicine

## 2011-08-16 DIAGNOSIS — Z1231 Encounter for screening mammogram for malignant neoplasm of breast: Secondary | ICD-10-CM | POA: Insufficient documentation

## 2011-08-16 DIAGNOSIS — Z139 Encounter for screening, unspecified: Secondary | ICD-10-CM

## 2011-08-19 ENCOUNTER — Other Ambulatory Visit: Payer: Self-pay | Admitting: Family Medicine

## 2011-08-19 DIAGNOSIS — R928 Other abnormal and inconclusive findings on diagnostic imaging of breast: Secondary | ICD-10-CM

## 2011-09-15 ENCOUNTER — Ambulatory Visit (HOSPITAL_COMMUNITY)
Admission: RE | Admit: 2011-09-15 | Discharge: 2011-09-15 | Disposition: A | Discharge status: Home / Self Care | Payer: BC Managed Care – PPO | Source: Ambulatory Visit | Attending: Family Medicine | Admitting: Family Medicine

## 2011-09-15 ENCOUNTER — Other Ambulatory Visit: Payer: Self-pay | Admitting: Family Medicine

## 2011-09-15 DIAGNOSIS — R928 Other abnormal and inconclusive findings on diagnostic imaging of breast: Secondary | ICD-10-CM

## 2011-09-28 ENCOUNTER — Encounter: Payer: Self-pay | Admitting: Orthopedic Surgery

## 2011-09-28 ENCOUNTER — Ambulatory Visit (INDEPENDENT_AMBULATORY_CARE_PROVIDER_SITE_OTHER): Payer: BC Managed Care – PPO | Admitting: Orthopedic Surgery

## 2011-09-28 VITALS — BP 110/64 | Ht 59.0 in | Wt 139.0 lb

## 2011-09-28 DIAGNOSIS — M7552 Bursitis of left shoulder: Secondary | ICD-10-CM

## 2011-09-28 DIAGNOSIS — M719 Bursopathy, unspecified: Secondary | ICD-10-CM

## 2011-09-28 MED ORDER — ACETAMINOPHEN-CODEINE #3 300-30 MG PO TABS: 1.0000 | ORAL_TABLET | ORAL | Status: AC | PRN

## 2011-09-28 NOTE — Progress Notes (Signed)
Patient ID: Ann Vasquez, female   DOB: 1946-06-07, 66 y.o.   MRN: [id number]  NEW PROBLEM   LEFT  SHOULDER PAIN   ? INJURY TO BICEPS MOVING FURNITURE   PAIN LEFT  SHOULDER, DIFFICULT TO MOVE ARM.   MSK RIGHT SHOULDER NORMAL   Physical Exam(12) GENERAL: normal development, grooming, and hygiene   CDV: pulses are normal, there is no edema, extremities are warm to touch   Skin: normal, and all extremities  Lymph: nodes were not palpable/normal, cervical and supraclavicular  Psychiatric: awake, alert and oriented  Neuro: normal sensation  MSK   Neck exam: NORMAL ROM NO TENDERNESS NORMA MUSCLE TONE   1 Tenderness LEFT biceps, LEFT rotator interval 2 Passive range of motion, limited by pain 3 Shoulder stability is normal 4 Internal and external rotation strength is normal, super supinators, tendon is showing weakness 5 Impingement sign is positive 6 Abduction external rotation, sign normal  Imaging:None indicated  Assessment: Bursitis LEFT shoulder    Plan: Injection, rest, repeat exam IN  7 days

## 2011-09-28 NOTE — Patient Instructions (Signed)
You have received a steroid shot. 15% of patients experience increased pain at the injection site with in the next 24 hours. This is best treated with ice and tylenol extra strength 2 tabs every 8 hours. If you are still having pain please call the office.   OOW JAN 29TH TO FEB 5TH

## 2011-10-05 ENCOUNTER — Encounter: Payer: Self-pay | Admitting: Orthopedic Surgery

## 2011-10-05 ENCOUNTER — Ambulatory Visit (INDEPENDENT_AMBULATORY_CARE_PROVIDER_SITE_OTHER): Payer: BC Managed Care – PPO | Admitting: Orthopedic Surgery

## 2011-10-05 VITALS — BP 140/82 | Ht 59.0 in | Wt 139.0 lb

## 2011-10-05 DIAGNOSIS — M75100 Unspecified rotator cuff tear or rupture of unspecified shoulder, not specified as traumatic: Secondary | ICD-10-CM | POA: Insufficient documentation

## 2011-10-05 DIAGNOSIS — S43429A Sprain of unspecified rotator cuff capsule, initial encounter: Secondary | ICD-10-CM

## 2011-10-05 NOTE — Progress Notes (Addendum)
Patient ID: Ann Vasquez, female   DOB: June 15, 1946, 66 y.o.   MRN: [id number]  Chief complaint persistent LEFT shoulder pain.  The patient injured her LEFT shoulder moving furniture about 4 weeks ago. She came to [place] and it was thought that she may have some bursitis and a biceps tendon rupture. She was treated with hydrocodone and cortisone injection, as well as over-the-counter anti-inflammatory ibuprofen. She is complaining of severe pain and inability to sleep. She cannot perform activities of daily living, such as simple cooking or lifting any small objects. She has painful forward elevation of the shoulder and is limited to less than 90.  ROS: mild neck pain but this is not what is bothering her   Her impingement sign is positive. She has weakness of the supraspinatus and cannot lift it against gravity.  After failure of nonoperative treatment, including anti-inflammatory, narcotic pain medication, and appeared to rest as she was out of work for the last 2 weeks. We have ordered MRI. Pending approval from insurance to evaluate for rotator cuff tear.

## 2011-10-05 NOTE — Patient Instructions (Addendum)
MRI LEFT SHOULDER ROTATOR CUFF TEAR  THIS WILL REQUIRE A PRE-CERTIFICATION , PLEASE ALLOW 7-10 DAYS TO GET PRE-CERTIFICATION   NEEDS OOW X 2 WEEKS

## 2011-10-15 ENCOUNTER — Telehealth: Payer: Self-pay | Admitting: Radiology

## 2011-10-15 NOTE — Telephone Encounter (Signed)
I called to give the patient her MRI appointment at [facility] on 10-19-11 11:30. Patient has [employer], authorization [id number] and it expires on 11-13-11. Patient will follow up back here in the office for her results.

## 2011-10-19 ENCOUNTER — Ambulatory Visit (HOSPITAL_COMMUNITY)
Admission: RE | Admit: 2011-10-19 | Discharge: 2011-10-19 | Disposition: A | Discharge status: Home / Self Care | Payer: BC Managed Care – PPO | Source: Ambulatory Visit | Attending: Orthopedic Surgery | Admitting: Orthopedic Surgery

## 2011-10-19 DIAGNOSIS — S43439A Superior glenoid labrum lesion of unspecified shoulder, initial encounter: Secondary | ICD-10-CM | POA: Insufficient documentation

## 2011-10-19 DIAGNOSIS — X58XXXA Exposure to other specified factors, initial encounter: Secondary | ICD-10-CM | POA: Insufficient documentation

## 2011-10-19 DIAGNOSIS — M75100 Unspecified rotator cuff tear or rupture of unspecified shoulder, not specified as traumatic: Secondary | ICD-10-CM

## 2011-10-19 DIAGNOSIS — S46819A Strain of other muscles, fascia and tendons at shoulder and upper arm level, unspecified arm, initial encounter: Secondary | ICD-10-CM | POA: Insufficient documentation

## 2011-10-19 DIAGNOSIS — M25519 Pain in unspecified shoulder: Secondary | ICD-10-CM | POA: Insufficient documentation

## 2011-10-25 ENCOUNTER — Encounter: Payer: Self-pay | Admitting: Orthopedic Surgery

## 2011-10-25 ENCOUNTER — Ambulatory Visit (INDEPENDENT_AMBULATORY_CARE_PROVIDER_SITE_OTHER): Payer: BC Managed Care – PPO | Admitting: Orthopedic Surgery

## 2011-10-25 VITALS — Ht 59.0 in | Wt 139.0 lb

## 2011-10-25 DIAGNOSIS — M75102 Unspecified rotator cuff tear or rupture of left shoulder, not specified as traumatic: Secondary | ICD-10-CM | POA: Insufficient documentation

## 2011-10-25 DIAGNOSIS — S46119A Strain of muscle, fascia and tendon of long head of biceps, unspecified arm, initial encounter: Secondary | ICD-10-CM

## 2011-10-25 DIAGNOSIS — S43429A Sprain of unspecified rotator cuff capsule, initial encounter: Secondary | ICD-10-CM

## 2011-10-25 DIAGNOSIS — S43499A Other sprain of unspecified shoulder joint, initial encounter: Secondary | ICD-10-CM

## 2011-10-25 NOTE — Progress Notes (Signed)
Patient ID: Ann Vasquez, female   DOB: 09-Feb-1946, 66 y.o.   MRN: [id number] Chief Complaint  Patient presents with  . Follow-up    MRI results of left shoulder.   Chief complaint of LEFT shoulder pain and loss of motion  The patient was moving some furniture injured her LEFT arm and has had several months of pain and inability to forward elevate the arm with pain with overhead activity.  She was treated nonoperatively initially did not improve was sent for MRI and she has a LEFT supraspinatus tendon tear 14 x 14 mm, there is tendinopathy without a tear of the infraspinatus and subscapularis with a torn retracted long head of biceps tendon and torn superior labrum.  Degenerative changes of the a.c. Joint are noted they're mild.  Type II acromion and mild spurring.  The patient is still symptomatic.  In order to prevent progression of this tear and improve her function and decrease her pain I have offered her an option of surgery vs. Continued nonoperative treatment.  She is agreeable to surgical treatment vs. To nonoperative care   Past Medical History  Diagnosis Date  . HTN (hypertension)   . High cholesterol     Past Surgical History  Procedure Date  . Right shoulder   . Shoulder surgery     History   Social History  . Marital Status: Widowed    Spouse Name: N/A    Number of Children: N/A  . Years of Education: 12th grade   Occupational History  . 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 Smoking status: Never Smoker   . Smokeless tobacco: Not on file  . Alcohol Use: No  . Drug Use: No  . Sexually Active: Not on file   Other Topics Concern  . Not on file   Social History Narrative  . No narrative on file    Vital signs are stable as recorded  General appearance is normal  The patient is alert and oriented x3  The patient's mood and affect are normal  Gait assessment: normal The cardiovascular exam reveals normal pulses and  temperature without edema swelling.  The lymphatic system is negative for palpable lymph nodes  The sensory exam is normal.  There are no pathologic reflexes.  Balance is normal.   Left shoulder:  Inspection Mild tenderness in the anterolateral deltoid and acromion Range of motion Active range of motion is limited to 80 of forward elevation and external rotation of 40 Stability Normal Strength Weakness of the supraspinatus.  Internal rotation is normal external rotation shows mild weakness Skin Normal  Lower extremity exam  Ambulation is normal.  Inspection and palpation revealed no tenderness or abnormality in alignment in the lower extremities. Range of motion is full.  Strength is grade 5.   all joints are stable.   MRI findings as stated  Rotator cuff tear LEFT shoulder with biceps tendon tear  Recommend open rotator cuff repair with biceps tenodesis

## 2011-10-25 NOTE — Patient Instructions (Addendum)
Surgery for Rotator Cuff Tear with Rehab Rotator cuff surgery is only recommended for individuals who have experienced persistent disability for greater than 3 months of non-surgical (conservative) treatment. Surgery is not necessary but is recommended for individuals who experience difficulty completing daily activities or athletes who are unable to compete. Rotator cuff tears do not usually heal without surgical intervention. If left alone small rotator cuff tears usually become larger. Younger athletes who have a rotator cuff tear may be recommended for surgery without attempting conservative rehabilitation. The purpose of surgery is to regain function of the shoulder joint and eliminate pain associated with the injury. In addition to repairing the tendon tear, the surgery often removes a portion of the bony roof of the shoulder (acromion) as well as the chronically thickened and inflamed membrane below the acromion (subacromial bursa). REASONS NOT TO OPERATE    Infection of the shoulder.     Inability to complete a rehabilitation program.     Patients who have other conditions (emotional or psychological) conditions that contribute to their shoulder condition.  RISKS AND COMPLICATIONS  Infection.     Re-tear of the rotator cuff tendons or muscles.     Shoulder stiffness and/or weakness.     Inability to compete in athletics.     Acromioclavicular (AC) joint paint.     Risks of surgery: infection, bleeding, nerve damage, or damage to surrounding tissues.  TECHNIQUE There are different surgical procedures used to treat rotator cuff tears. The type of procedure depends on the extent of injury as well as the surgeon's preference. All of the surgical techniques for rotator cuff tears have the same goal of repairing the torn tendon, removing part of the acromion, and removing the subacromial bursa. There are two main types of procedures: arthroscopic and open incision. Arthroscopic procedures  are usually completed and you go home the same day as surgery (outpatient). These procedures use multiple small incisions in which tools and a video camera are placed to work on the shoulder. An electric shaver removes the bursa, then a power burr shaves down the portion of the acromion that places pressure on the rotator cuff. Finally the rotator cuff is sewed (sutured) back to the humeral head. Open incision procedures require a larger incision. The deltoid muscle is detached from the acromion and a ligament in the shoulder (coracoacromial) is cut in order for the surgeon to access the rotator cuff. The subacromial bursa is removed as well as part of the acromion to give the rotator cuff room to move freely. The torn tendon is then sutured to the humeral head. After the rotator cuff is repaired, then the deltoid is reattached and the incision is closed up.   RECOVERY    Post-operative care depends on the surgical technique and the preferences of your therapist.     Keep the wound clean and dry for the first 10 to 14 days after surgery.     Keep your shoulder and arm in the sling provided to you for as long as you have been instructed to.     You will be given pain medications by your caregiver.     Passive (without using muscles) shoulder movements may be begun immediately after surgery.     It is important to follow through with you rehabilitation program in order to have the best possible recovery.  RETURN TO SPORTS    The rehabilitation period will depend on the sport and position you play as well as the success  of the operation.     The minimum recovery period is 6 months.     You must have regained complete shoulder motion and strength before returning to sports.  SEEK IMMEDIATE MEDICAL CARE IF:    Any medications produce adverse side effects.     Any complications from surgery occur:     Pain, numbness, or coldness in the extremity operated upon.     Discoloration of the nail  beds (they become blue or gray) of the extremity operated upon.     Signs of infections (fever, pain, inflammation, redness, or persistent bleeding).  EXERCISES   RANGE OF MOTION (ROM) AND STRETCHING EXERCISES - Rotator Cuff Tear, Surgery For These exercises may help you restore your elbow mobility once your physician has discontinued your immobilization period. Beginning these before your provider's approval may result in delayed healing. Your symptoms may resolve with or without further involvement from your physician, physical therapist or athletic trainer. While completing these exercises, remember:    Restoring tissue flexibility helps normal motion to return to the joints. This allows healthier, less painful movement and activity.     An effective stretch should be held for at least 30 seconds. A stretch should never be painful. You should only feel a gentle lengthening or release in the stretch.  ROM - Pendulum   Bend at the waist so that your right / left arm falls away from your body. Support yourself with your opposite hand on a solid surface, such as a table or a countertop.     Your right / left arm should be perpendicular to the ground. If it is not perpendicular, you need to lean over farther. Relax the muscles in your right / left arm and shoulder as much as possible.     Gently sway your hips and trunk so they move your right / left arm without any use of your right / left shoulder muscles.     Progress your movements so that your right / left arm moves side to side, then forward and backward, and finally, both clockwise and counterclockwise.     Complete __________ repetitions in each direction. Many people use this exercise to relieve discomfort in their shoulder as well as to gain range of motion.  Repeat __________ times. Complete this exercise __________ times per day. STRETCH - Flexion, Seated   Sit in a firm chair so that your right / left forearm can rest on a table or  on a table or countertop. Your right / left elbow should rest below the height of your shoulder so that your shoulder feels supported and not tense or uncomfortable.     Keeping your right / left shoulder relaxed, lean forward at your waist, allowing your right / left hand to slide forward. Bend forward until you feel a moderate stretch in your shoulder, but before you feel an increase in your pain.     Hold __________ seconds. Slowly return to your starting position.  Repeat __________ times. Complete this exercise __________ times per day.   STRETCH - Flexion, Standing   Stand with good posture. With an underhand grip on your right / left and an overhand grip on the opposite hand, grasp a broomstick or cane so that your hands are a little more than shoulder-width apart.     Keeping your right / left elbow straight and shoulder muscles relaxed, push the stick with your opposite hand to raise your right / left arm in front of  your body and then overhead. Raise your arm until you feel a stretch in your right / left shoulder, but before you have increased shoulder pain.     Try to avoid shrugging your right / left shoulder as your arm rises by keeping your shoulder blade tucked down and toward your mid-back spine. Hold __________ seconds.     Slowly return to the starting position.  Repeat __________ times. Complete this exercise __________ times per day.   STRETCH - Abduction, Supine   Stand with good posture. With an underhand grip on your right / left and an overhand grip on the opposite hand, grasp a broomstick or cane so that your hands are a little more than shoulder-width apart.     Keeping your right / left elbow straight and shoulder muscles relaxed, push the stick with your opposite hand to raise your right / left arm out to the side of your body and then overhead. Raise your arm until you feel a stretch in your right / left shoulder, but before you have increased shoulder pain.     Try  to avoid shrugging your right / left shoulder as your arm rises by keeping your shoulder blade tucked down and toward your mid-back spine. Hold __________ seconds.     Slowly return to the starting position.  Repeat __________ times. Complete this exercise __________ times per day.   ROM - Flexion, Active-Assisted  Lie on your back. You may bend your knees for comfort.     Grasp a broomstick or cane so your hands are about shoulder-width apart. Your right / left hand should grip the end of the stick/cane so that your hand is positioned "thumbs-up," as if you were about to shake hands.     Using your healthy arm to lead, raise your right / left arm overhead until you feel a gentle stretch in your shoulder. Hold __________ seconds.     Use the stick/cane to assist in returning your right / left arm to its starting position.  Repeat __________ times. Complete this exercise __________ times per day.   STRETCH - External Rotation   Tuck a folded towel or small ball under your right / left upper arm. Grasp a broomstick or cane with an underhand grasp a little more than shoulder width apart. Bend your elbows to 90 degrees.     Stand with good posture or sit in a chair without arms.     Use your strong arm to push the stick across your body. Do not allow the towel or ball to fall. This will rotate your right / left arm away from your abdomen. Using the stick turn/rotate your hand and forearm away from your body. Hold __________ seconds.  Repeat __________ times. Complete this exercise __________ times per day.   STRENGTHENING EXERCISES - Rotator Cuff Tear, Surgery For These exercises may help you begin to restore your elbow strength in the initial stage of your rehabilitation. Your physician will determine when you begin these exercises depending on the severity of your injury and the integrity of your repaired tissues. Beginning these before your provider's approval may result in delayed healing.  While completing these exercises, remember:    Muscles can gain both the endurance and the strength needed for everyday activities through controlled exercises.     Complete these exercises as instructed by your physician, physical therapist or athletic trainer. Progress the resistance and repetitions only as guided.     You may experience muscle  soreness or fatigue, but the pain or discomfort you are trying to eliminate should never worsen during these exercises. If this pain does worsen, stop and make certain you are following the directions exactly. If the pain is still present after adjustments, discontinue the exercise until you can discuss the trouble with your clinician.  STRENGTH - Shoulder Flexion, Isometric   With good posture and facing a wall, stand or sit about 4-6 inches away.     Keeping your right / left elbow straight, gently press the top of your fist into the wall. Increase the pressure gradually until you are pressing as hard as you can without shrugging your shoulder or increasing any shoulder discomfort.     Hold __________ seconds. Release the tension slowly. Relax your shoulder muscles completely before you do the next repetition.  Repeat __________ times. Complete this exercise __________ times per day.   STRENGTH - Shoulder Abductors, Isometric   With good posture, stand or sit about 4-6 inches from a wall with your right / left side facing the wall.     Bend your right / left elbow. Gently press your right / left elbow into the wall. Increase the pressure gradually until you are pressing as hard as you can without shrugging your shoulder or increasing any shoulder discomfort.     Hold __________ seconds.     Release the tension slowly. Relax your shoulder muscles completely before you do the next repetition.  Repeat __________ times. Complete this exercise __________ times per day.   STRENGTH - Internal Rotators, Isometric   Keep your right / left elbow at your  side and bend it 90 degrees.     Step into a door frame so that the inside of your right / left wrist can press against the door frame without your upper arm leaving your side.     Gently press your right / left wrist into the door frame as if you were trying to draw the palm of your hand to your abdomen. Gradually increase the tension until you are pressing as hard as you can without shrugging your shoulder or increasing any shoulder discomfort.     Hold __________ seconds.     Release the tension slowly. Relax your shoulder muscles completely before you do the next repetition.  Repeat __________ times. Complete this exercise __________ times per day.   STRENGTH - External Rotators, Isometric   Keep your right / left elbow at your side and bend it 90 degrees.     Step into a door frame so that the outside of your right / left wrist can press against the door frame without your upper arm leaving your side.     Gently press your right / left wrist into the door frame as if you were trying to swing the back of your hand away from your abdomen. Gradually increase the tension until you are pressing as hard as you can without shrugging your shoulder or increasing any shoulder discomfort.     Hold __________ seconds.     Release the tension slowly. Relax your shoulder muscles completely before you do the next repetition.  Repeat __________ times. Complete this exercise __________ times per day.   Document Released: 08/16/2005 Document Revised: 04/28/2011 Document Reviewed: 11/28/2008 [facility] Patient Information 2012 [place], [place].

## 2011-10-26 ENCOUNTER — Encounter (HOSPITAL_COMMUNITY): Payer: Self-pay | Admitting: Pharmacy Technician

## 2011-10-26 ENCOUNTER — Encounter: Payer: Self-pay | Admitting: Orthopedic Surgery

## 2011-10-27 ENCOUNTER — Encounter (HOSPITAL_COMMUNITY): Payer: Self-pay

## 2011-10-27 ENCOUNTER — Telehealth: Payer: Self-pay | Admitting: Orthopedic Surgery

## 2011-10-27 ENCOUNTER — Encounter (HOSPITAL_COMMUNITY)
Admission: RE | Admit: 2011-10-27 | Discharge: 2011-10-27 | Disposition: A | Discharge status: Home / Self Care | Payer: BC Managed Care – PPO | Source: Ambulatory Visit | Attending: Orthopedic Surgery | Admitting: Orthopedic Surgery

## 2011-10-27 ENCOUNTER — Other Ambulatory Visit: Payer: Self-pay

## 2011-10-27 HISTORY — DX: Type 2 diabetes mellitus without complications: E11.9

## 2011-10-27 LAB — CBC
Hemoglobin: 11 g/dL — ABNORMAL LOW (ref 12.0–15.0)
RBC: 4.81 MIL/uL (ref 3.87–5.11)

## 2011-10-27 LAB — SURGICAL PCR SCREEN: Staphylococcus aureus: NEGATIVE

## 2011-10-27 LAB — BASIC METABOLIC PANEL
CO2: 27 mEq/L (ref 19–32)
Chloride: 108 mEq/L (ref 96–112)
Glucose, Bld: 127 mg/dL — ABNORMAL HIGH (ref 70–99)
Potassium: 4.7 mEq/L (ref 3.5–5.1)
Sodium: 141 mEq/L (ref 135–145)

## 2011-10-27 LAB — TYPE AND SCREEN

## 2011-10-27 NOTE — Patient Instructions (Signed)
20 Ann Vasquez  10/27/2011   Your procedure is scheduled on:  Friday, 10/29/11  Report to [facility] at 0720 AM.  Call this number if you have problems the morning of surgery: [phone]   Remember:   Do not eat food:After Midnight.  May have clear liquids:until Midnight .  Clear liquids include soda, tea, black coffee, apple or grape juice, broth.  Take these medicines the morning of surgery with A SIP OF WATER: lisinopril   Do not wear jewelry, make-up or nail polish.  Do not wear lotions, powders, or perfumes. You may wear deodorant.  Do not bring valuables to the hospital.  Contacts, dentures or bridgework may not be worn into surgery.  Leave suitcase in the car. After surgery it may be brought to your room.  For patients admitted to the hospital, checkout time is 11:00 AM the day of discharge.   Patients discharged the day of surgery will not be allowed to drive home.  Name and phone number of your driver: driver  Special Instructions: CHG Shower Use Special Wash: 1/2 bottle night before surgery and 1/2 bottle morning of surgery.   Please read over the following fact sheets that you were given: Pain Booklet, MRSA Information, Surgical Site Infection Prevention, Anesthesia Post-op Instructions and Care and Recovery After Surgery   Surgery for Rotator Cuff Tear with Rehab Rotator cuff surgery is only recommended for individuals who have experienced persistent disability for greater than 3 months of non-surgical (conservative) treatment. Surgery is not necessary but is recommended for individuals who experience difficulty completing daily activities or athletes who are unable to compete. Rotator cuff tears do not usually heal without surgical intervention. If left alone small rotator cuff tears usually become larger. Younger athletes who have a rotator cuff tear may be recommended for surgery without attempting conservative rehabilitation. The purpose of surgery is to regain  function of the shoulder joint and eliminate pain associated with the injury. In addition to repairing the tendon tear, the surgery often removes a portion of the bony roof of the shoulder (acromion) as well as the chronically thickened and inflamed membrane below the acromion (subacromial bursa). REASONS NOT TO OPERATE   Infection of the shoulder.   Inability to complete a rehabilitation program.   Patients who have other conditions (emotional or psychological) conditions that contribute to their shoulder condition.  RISKS AND COMPLICATIONS  Infection.   Re-tear of the rotator cuff tendons or muscles.   Shoulder stiffness and/or weakness.   Inability to compete in athletics.   Acromioclavicular (AC) joint paint.   Risks of surgery: infection, bleeding, nerve damage, or damage to surrounding tissues.  TECHNIQUE There are different surgical procedures used to treat rotator cuff tears. The type of procedure depends on the extent of injury as well as the surgeon's preference. All of the surgical techniques for rotator cuff tears have the same goal of repairing the torn tendon, removing part of the acromion, and removing the subacromial bursa. There are two main types of procedures: arthroscopic and open incision. Arthroscopic procedures are usually completed and you go home the same day as surgery (outpatient). These procedures use multiple small incisions in which tools and a video camera are placed to work on the shoulder. An electric shaver removes the bursa, then a power burr shaves down the portion of the acromion that places pressure on the rotator cuff. Finally the rotator cuff is sewed (sutured) back to the humeral head. Open incision procedures require a larger  incision. The deltoid muscle is detached from the acromion and a ligament in the shoulder (coracoacromial) is cut in order for the surgeon to access the rotator cuff. The subacromial bursa is removed as well as part of the  acromion to give the rotator cuff room to move freely. The torn tendon is then sutured to the humeral head. After the rotator cuff is repaired, then the deltoid is reattached and the incision is closed up.  RECOVERY   Post-operative care depends on the surgical technique and the preferences of your therapist.   Keep the wound clean and dry for the first 10 to 14 days after surgery.   Keep your shoulder and arm in the sling provided to you for as long as you have been instructed to.   You will be given pain medications by your caregiver.   Passive (without using muscles) shoulder movements may be begun immediately after surgery.   It is important to follow through with you rehabilitation program in order to have the best possible recovery.  RETURN TO SPORTS   The rehabilitation period will depend on the sport and position you play as well as the success of the operation.   The minimum recovery period is 6 months.   You must have regained complete shoulder motion and strength before returning to sports.  SEEK IMMEDIATE MEDICAL CARE IF:   Any medications produce adverse side effects.   Any complications from surgery occur:   Pain, numbness, or coldness in the extremity operated upon.   Discoloration of the nail beds (they become blue or gray) of the extremity operated upon.   Signs of infections (fever, pain, inflammation, redness, or persistent bleeding).  EXERCISES  RANGE OF MOTION (ROM) AND STRETCHING EXERCISES - Rotator Cuff Tear, Surgery For These exercises may help you restore your elbow mobility once your physician has discontinued your immobilization period. Beginning these before your provider's approval may result in delayed healing. Your symptoms may resolve with or without further involvement from your physician, physical therapist or athletic trainer. While completing these exercises, remember:   Restoring tissue flexibility helps normal motion to return to the joints.  This allows healthier, less painful movement and activity.   An effective stretch should be held for at least 30 seconds. A stretch should never be painful. You should only feel a gentle lengthening or release in the stretch.  ROM - Pendulum   Bend at the waist so that your right / left arm falls away from your body. Support yourself with your opposite hand on a solid surface, such as a table or a countertop.   Your right / left arm should be perpendicular to the ground. If it is not perpendicular, you need to lean over farther. Relax the muscles in your right / left arm and shoulder as much as possible.   Gently sway your hips and trunk so they move your right / left arm without any use of your right / left shoulder muscles.   Progress your movements so that your right / left arm moves side to side, then forward and backward, and finally, both clockwise and counterclockwise.   Complete __________ repetitions in each direction. Many people use this exercise to relieve discomfort in their shoulder as well as to gain range of motion.  Repeat __________ times. Complete this exercise __________ times per day. STRETCH - Flexion, Seated   Sit in a firm chair so that your right / left forearm can rest on a table or  on a table or countertop. Your right / left elbow should rest below the height of your shoulder so that your shoulder feels supported and not tense or uncomfortable.   Keeping your right / left shoulder relaxed, lean forward at your waist, allowing your right / left hand to slide forward. Bend forward until you feel a moderate stretch in your shoulder, but before you feel an increase in your pain.   Hold __________ seconds. Slowly return to your starting position.  Repeat __________ times. Complete this exercise __________ times per day.  STRETCH - Flexion, Standing   Stand with good posture. With an underhand grip on your right / left and an overhand grip on the opposite hand, grasp a  broomstick or cane so that your hands are a little more than shoulder-width apart.   Keeping your right / left elbow straight and shoulder muscles relaxed, push the stick with your opposite hand to raise your right / left arm in front of your body and then overhead. Raise your arm until you feel a stretch in your right / left shoulder, but before you have increased shoulder pain.   Try to avoid shrugging your right / left shoulder as your arm rises by keeping your shoulder blade tucked down and toward your mid-back spine. Hold __________ seconds.   Slowly return to the starting position.  Repeat __________ times. Complete this exercise __________ times per day.  STRETCH - Abduction, Supine   Stand with good posture. With an underhand grip on your right / left and an overhand grip on the opposite hand, grasp a broomstick or cane so that your hands are a little more than shoulder-width apart.   Keeping your right / left elbow straight and shoulder muscles relaxed, push the stick with your opposite hand to raise your right / left arm out to the side of your body and then overhead. Raise your arm until you feel a stretch in your right / left shoulder, but before you have increased shoulder pain.   Try to avoid shrugging your right / left shoulder as your arm rises by keeping your shoulder blade tucked down and toward your mid-back spine. Hold __________ seconds.   Slowly return to the starting position.  Repeat __________ times. Complete this exercise __________ times per day.  ROM - Flexion, Active-Assisted  Lie on your back. You may bend your knees for comfort.   Grasp a broomstick or cane so your hands are about shoulder-width apart. Your right / left hand should grip the end of the stick/cane so that your hand is positioned "thumbs-up," as if you were about to shake hands.   Using your healthy arm to lead, raise your right / left arm overhead until you feel a gentle stretch in your shoulder.  Hold __________ seconds.   Use the stick/cane to assist in returning your right / left arm to its starting position.  Repeat __________ times. Complete this exercise __________ times per day.  STRETCH - External Rotation   Tuck a folded towel or small ball under your right / left upper arm. Grasp a broomstick or cane with an underhand grasp a little more than shoulder width apart. Bend your elbows to 90 degrees.   Stand with good posture or sit in a chair without arms.   Use your strong arm to push the stick across your body. Do not allow the towel or ball to fall. This will rotate your right / left arm away from your abdomen.  Using the stick turn/rotate your hand and forearm away from your body. Hold __________ seconds.  Repeat __________ times. Complete this exercise __________ times per day.  STRENGTHENING EXERCISES - Rotator Cuff Tear, Surgery For These exercises may help you begin to restore your elbow strength in the initial stage of your rehabilitation. Your physician will determine when you begin these exercises depending on the severity of your injury and the integrity of your repaired tissues. Beginning these before your provider's approval may result in delayed healing. While completing these exercises, remember:   Muscles can gain both the endurance and the strength needed for everyday activities through controlled exercises.   Complete these exercises as instructed by your physician, physical therapist or athletic trainer. Progress the resistance and repetitions only as guided.   You may experience muscle soreness or fatigue, but the pain or discomfort you are trying to eliminate should never worsen during these exercises. If this pain does worsen, stop and make certain you are following the directions exactly. If the pain is still present after adjustments, discontinue the exercise until you can discuss the trouble with your clinician.  STRENGTH - Shoulder Flexion, Isometric    With good posture and facing a wall, stand or sit about 4-6 inches away.   Keeping your right / left elbow straight, gently press the top of your fist into the wall. Increase the pressure gradually until you are pressing as hard as you can without shrugging your shoulder or increasing any shoulder discomfort.   Hold __________ seconds. Release the tension slowly. Relax your shoulder muscles completely before you do the next repetition.  Repeat __________ times. Complete this exercise __________ times per day.  STRENGTH - Shoulder Abductors, Isometric   With good posture, stand or sit about 4-6 inches from a wall with your right / left side facing the wall.   Bend your right / left elbow. Gently press your right / left elbow into the wall. Increase the pressure gradually until you are pressing as hard as you can without shrugging your shoulder or increasing any shoulder discomfort.   Hold __________ seconds.   Release the tension slowly. Relax your shoulder muscles completely before you do the next repetition.  Repeat __________ times. Complete this exercise __________ times per day.  STRENGTH - Internal Rotators, Isometric   Keep your right / left elbow at your side and bend it 90 degrees.   Step into a door frame so that the inside of your right / left wrist can press against the door frame without your upper arm leaving your side.   Gently press your right / left wrist into the door frame as if you were trying to draw the palm of your hand to your abdomen. Gradually increase the tension until you are pressing as hard as you can without shrugging your shoulder or increasing any shoulder discomfort.   Hold __________ seconds.   Release the tension slowly. Relax your shoulder muscles completely before you do the next repetition.  Repeat __________ times. Complete this exercise __________ times per day.  STRENGTH - External Rotators, Isometric   Keep your right / left elbow at your  side and bend it 90 degrees.   Step into a door frame so that the outside of your right / left wrist can press against the door frame without your upper arm leaving your side.   Gently press your right / left wrist into the door frame as if you were trying to swing the back  of your hand away from your abdomen. Gradually increase the tension until you are pressing as hard as you can without shrugging your shoulder or increasing any shoulder discomfort.   Hold __________ seconds.   Release the tension slowly. Relax your shoulder muscles completely before you do the next repetition.  Repeat __________ times. Complete this exercise __________ times per day.  Document Released: 08/16/2005 Document Revised: 04/28/2011 Document Reviewed: 11/28/2008 [facility] Patient Information 2012 [place], [place].

## 2011-10-27 NOTE — Telephone Encounter (Signed)
Contacted insurer [employer] at [phone], regarding out-patient surgery scheduled 10/29/11 at [facility], CPT codes 28413, [phone], [phone], ICD9 codes 840.4, 840.8.  Left voice message at Care Management department re: pre-authorization to inquire if needed for these procedures.  **Follow up 10/28/11 if no return call.

## 2011-10-28 NOTE — Telephone Encounter (Signed)
10/28/11 received call back from Jonny Ruiz, intake coordinator at [facility]; states that these procedure codes do not require prior authorization, need to meet medical policy and guidelines as outlined in plan.

## 2011-10-28 NOTE — H&P (Signed)
Ann Vasquez is an 66 y.o. female.   Chief Complaint: Left shoulder pain HPI: The patient injured her left shoulder moving furniture back in January. We treated her with an injection and nonoperative treatment but she did not improve she eventually had an MRI which shows she has a torn rotator cuff. She's not satisfied with the condition of her shoulder and she would like surgery to repair the cuff tear. Her activities of daily living have become more difficult. She can't sleep on the left shoulder. The arm has no strength in. The pain has become more severe.  Review of systems complete system review is negative except for the musculoskeletal system.  Past Medical History  Diagnosis Date  . HTN (hypertension)   . High cholesterol   . Diabetes mellitus type 2, diet-controlled     Past Surgical History  Procedure Date  . Right shoulder 2011    Romeo Apple, [facility]  . Shoulder surgery     Family History  Problem Relation Age of Onset  . Anesthesia problems Neg Hx   . Malignant hyperthermia Neg Hx   . Hypotension Neg Hx   . Pseudochol deficiency Neg Hx    Social History:  reports that she has never smoked. She does not have any smokeless tobacco history on file. She reports that she does not drink alcohol or use illicit drugs.  Allergies: No Known Allergies  No current facility-administered medications on file as of .   Medications Prior to Admission  Medication Sig Dispense Refill  . lisinopril (PRINIVIL,ZESTRIL) 20 MG tablet Take 20 mg by mouth daily.      . rosuvastatin (CRESTOR) 10 MG tablet Take 10 mg by mouth daily.      Marland Kitchen ibuprofen (ADVIL,MOTRIN) 200 MG tablet Take 400 mg by mouth every 6 (six) hours as needed. For pain        Results for orders placed during the hospital encounter of 10/27/11 (from the past 48 hour(s))  TYPE AND SCREEN     Status: Normal   Collection Time   10/27/11 11:30 AM      Component Value Range Comment   ABO/RH(D) A POS      Antibody Screen NEG       Sample Expiration 11/10/2011      Antibody ID,T [place] HIDE     BASIC METABOLIC PANEL     Status: Abnormal   Collection Time   10/27/11 11:30 AM      Component Value Range Comment   Sodium 141  135 - 145 (mEq/L)    Potassium 4.7  3.5 - 5.1 (mEq/L)    Chloride 108  96 - 112 (mEq/L)    CO2 27  19 - 32 (mEq/L)    Glucose, Bld 127 (*) 70 - 99 (mg/dL)    BUN 10  6 - 23 (mg/dL)    Creatinine, Ser 3.08  0.50 - 1.10 (mg/dL)    Calcium 65.7  8.4 - 10.5 (mg/dL)    GFR calc non Af Amer >90  >90 (mL/min)    GFR calc Af Amer >90  >90 (mL/min)   SURGICAL PCR SCREEN     Status: Normal   Collection Time   10/27/11 11:30 AM      Component Value Range Comment   MRSA, PCR NEGATIVE  NEGATIVE     Staphylococcus aureus NEGATIVE  NEGATIVE    CBC     Status: Abnormal   Collection Time   10/27/11 12:22 PM  Component Value Range Comment   WBC 4.5  4.0 - 10.5 (K/uL)    RBC 4.81  3.87 - 5.11 (MIL/uL)    Hemoglobin 11.0 (*) 12.0 - 15.0 (g/dL)    HCT 16.1 (*) 09.6 - 46.0 (%)    MCV 74.0 (*) 78.0 - 100.0 (fL)    MCH 22.9 (*) 26.0 - 34.0 (pg)    MCHC 30.9  30.0 - 36.0 (g/dL)    RDW 04.5  40.9 - 81.1 (%)    Platelets 211  150 - 400 (K/uL)    No results found.  ROS  There were no vitals taken for this visit. Physical Exam  Constitutional: She is oriented to person, place, and time. She appears well-developed and well-nourished. No distress.  HENT:  Head: Normocephalic and atraumatic.  Eyes: Conjunctivae and EOM are normal. Pupils are equal, round, and reactive to light.  Neck: Normal range of motion. Neck supple. No JVD present. No tracheal deviation present. No thyromegaly present.  Cardiovascular: Normal rate.   Respiratory: Effort normal.  GI: Soft.  Musculoskeletal: Normal range of motion.       Lower extremity exam  Ambulation is normal.  Inspection and palpation revealed no tenderness or abnormality in alignment in the lower extremities. Range of motion is full.  Strength is grade  5,   all joints are stable.   left upper extremity  Inspection there is tenderness and anterolateral acromion and upper arm. There is no swelling. A.c. joint nontender. Range of motion is limited in flexion as well as external and internal rotation. Weakness is noted in the supraspinatus tendon normal strength is noted in the subscapularis mild external rotation weakness is noted. There is a painful arc of motion 80-150.  No instability was detected.  Left shoulder normal range of motion, strength stability and alignment.    Lymphadenopathy:    She has no cervical adenopathy.  Neurological: She is alert and oriented to person, place, and time. She has normal reflexes.  Skin: Skin is warm and dry. She is not diaphoretic.  Psychiatric: She has a normal mood and affect. Her behavior is normal. Thought content normal.     Imaging  MRI left shoulder IMPRESSION:  1. 14 x 14 mm full-thickness supraspinatus tendon tear. No fatty  atrophy of the supraspinatus muscle.  2. Infraspinatus and subscapularis tendinopathy but no discrete  tear.  3. Torn and retracted long head biceps tendon.  4. Torn superior labrum.  5. AC joint generative changes, lateral downsloping of a type 2  acromion and undersurface spurring change.  Assessment/Plan Left shoulder rotator cuff tear  Left rotator cuff repair open technique we will also have to perform a biceps tenodesis as it is torn as well.  Fuller Canada 10/28/2011, 1:19 PM

## 2011-10-29 ENCOUNTER — Other Ambulatory Visit: Payer: Self-pay | Admitting: Orthopedic Surgery

## 2011-10-29 ENCOUNTER — Encounter (HOSPITAL_COMMUNITY): Payer: Self-pay | Admitting: Anesthesiology

## 2011-10-29 ENCOUNTER — Encounter (HOSPITAL_COMMUNITY)
Admission: RE | Disposition: A | Discharge status: Home / Self Care | Payer: Self-pay | Source: Ambulatory Visit | Attending: Orthopedic Surgery

## 2011-10-29 ENCOUNTER — Encounter (HOSPITAL_COMMUNITY): Payer: Self-pay

## 2011-10-29 ENCOUNTER — Ambulatory Visit (HOSPITAL_COMMUNITY): Payer: BC Managed Care – PPO | Admitting: Anesthesiology

## 2011-10-29 ENCOUNTER — Ambulatory Visit (HOSPITAL_COMMUNITY)
Admission: RE | Admit: 2011-10-29 | Discharge: 2011-10-29 | Disposition: A | Discharge status: Home / Self Care | Payer: BC Managed Care – PPO | Source: Ambulatory Visit | Attending: Orthopedic Surgery | Admitting: Orthopedic Surgery

## 2011-10-29 DIAGNOSIS — Y92009 Unspecified place in unspecified non-institutional (private) residence as the place of occurrence of the external cause: Secondary | ICD-10-CM | POA: Insufficient documentation

## 2011-10-29 DIAGNOSIS — E119 Type 2 diabetes mellitus without complications: Secondary | ICD-10-CM | POA: Insufficient documentation

## 2011-10-29 DIAGNOSIS — Z01810 Encounter for preprocedural cardiovascular examination: Secondary | ICD-10-CM | POA: Insufficient documentation

## 2011-10-29 DIAGNOSIS — E7800 Pure hypercholesterolemia, unspecified: Secondary | ICD-10-CM | POA: Insufficient documentation

## 2011-10-29 DIAGNOSIS — I10 Essential (primary) hypertension: Secondary | ICD-10-CM | POA: Insufficient documentation

## 2011-10-29 DIAGNOSIS — X500XXA Overexertion from strenuous movement or load, initial encounter: Secondary | ICD-10-CM | POA: Insufficient documentation

## 2011-10-29 DIAGNOSIS — M75102 Unspecified rotator cuff tear or rupture of left shoulder, not specified as traumatic: Secondary | ICD-10-CM

## 2011-10-29 DIAGNOSIS — Z79899 Other long term (current) drug therapy: Secondary | ICD-10-CM | POA: Insufficient documentation

## 2011-10-29 DIAGNOSIS — Z01812 Encounter for preprocedural laboratory examination: Secondary | ICD-10-CM | POA: Insufficient documentation

## 2011-10-29 DIAGNOSIS — S43429A Sprain of unspecified rotator cuff capsule, initial encounter: Secondary | ICD-10-CM | POA: Insufficient documentation

## 2011-10-29 HISTORY — PX: SHOULDER OPEN ROTATOR CUFF REPAIR: SHX2407

## 2011-10-29 SURGERY — REPAIR, ROTATOR CUFF, OPEN
Anesthesia: General | Site: Shoulder | Laterality: Left | Wound class: Clean

## 2011-10-29 MED ORDER — GLYCOPYRROLATE 0.2 MG/ML IJ SOLN
INTRAMUSCULAR | Status: AC
  Filled 2011-10-29: qty 2

## 2011-10-29 MED ORDER — HYDROCODONE-ACETAMINOPHEN 5-325 MG PO TABS: 1.0000 | ORAL_TABLET | ORAL | Status: DC | PRN

## 2011-10-29 MED ORDER — ACETAMINOPHEN 325 MG PO TABS: 325.0000 mg | ORAL_TABLET | ORAL | Status: DC | PRN

## 2011-10-29 MED ORDER — BUPIVACAINE-EPINEPHRINE PF 0.5-1:200000 % IJ SOLN
INTRAMUSCULAR | Status: DC | PRN
  Administered 2011-10-29: 60 mL

## 2011-10-29 MED ORDER — GLYCOPYRROLATE 0.2 MG/ML IJ SOLN
INTRAMUSCULAR | Status: AC
  Administered 2011-10-29: 0.2 mg via INTRAVENOUS
  Filled 2011-10-29: qty 1

## 2011-10-29 MED ORDER — CELECOXIB 100 MG PO CAPS
ORAL_CAPSULE | ORAL | Status: AC
  Administered 2011-10-29: 400 mg via ORAL
  Filled 2011-10-29: qty 4

## 2011-10-29 MED ORDER — SUFENTANIL CITRATE 50 MCG/ML IV SOLN
INTRAVENOUS | Status: AC
  Filled 2011-10-29: qty 1

## 2011-10-29 MED ORDER — ONDANSETRON HCL 4 MG/2ML IJ SOLN
4.0000 mg | Freq: Once | INTRAMUSCULAR | Status: AC
  Administered 2011-10-29: 4 mg via INTRAVENOUS

## 2011-10-29 MED ORDER — METHYLPREDNISOLONE SODIUM SUCC 125 MG IJ SOLR
125.0000 mg | Freq: Once | INTRAMUSCULAR | Status: AC
  Administered 2011-10-29: 125 mg via INTRAVENOUS

## 2011-10-29 MED ORDER — NEOSTIGMINE METHYLSULFATE 1 MG/ML IJ SOLN
INTRAMUSCULAR | Status: DC | PRN
  Administered 2011-10-29: 4 mg via INTRAVENOUS

## 2011-10-29 MED ORDER — FENTANYL CITRATE 0.05 MG/ML IJ SOLN
25.0000 ug | INTRAMUSCULAR | Status: DC | PRN
  Administered 2011-10-29: 25 ug via INTRAVENOUS

## 2011-10-29 MED ORDER — SODIUM CHLORIDE 0.9 % IR SOLN
Status: DC | PRN
  Administered 2011-10-29: 1000 mL

## 2011-10-29 MED ORDER — NEOSTIGMINE METHYLSULFATE 1 MG/ML IJ SOLN
INTRAMUSCULAR | Status: AC
  Filled 2011-10-29: qty 10

## 2011-10-29 MED ORDER — PROMETHAZINE HCL 12.5 MG PO TABS: 12.5000 mg | ORAL_TABLET | Freq: Four times a day (QID) | ORAL | Status: DC | PRN

## 2011-10-29 MED ORDER — GLYCOPYRROLATE 0.2 MG/ML IJ SOLN
0.2000 mg | Freq: Once | INTRAMUSCULAR | Status: AC
  Administered 2011-10-29: 0.2 mg via INTRAVENOUS

## 2011-10-29 MED ORDER — ONDANSETRON HCL 4 MG/2ML IJ SOLN
INTRAMUSCULAR | Status: AC
  Administered 2011-10-29: 4 mg via INTRAVENOUS
  Filled 2011-10-29: qty 2

## 2011-10-29 MED ORDER — LACTATED RINGERS IV SOLN
INTRAVENOUS | Status: DC | PRN
  Administered 2011-10-29 (×2): via INTRAVENOUS

## 2011-10-29 MED ORDER — ONDANSETRON HCL 4 MG/2ML IJ SOLN: 4.0000 mg | Freq: Once | INTRAMUSCULAR | Status: DC

## 2011-10-29 MED ORDER — METHOCARBAMOL 100 MG/ML IJ SOLN
500.0000 mg | Freq: Once | INTRAVENOUS | Status: AC
  Administered 2011-10-29: 500 mg via INTRAVENOUS
  Filled 2011-10-29: qty 5

## 2011-10-29 MED ORDER — ACETAMINOPHEN 10 MG/ML IV SOLN
1000.0000 mg | Freq: Once | INTRAVENOUS | Status: AC
  Administered 2011-10-29: 1000 mg via INTRAVENOUS

## 2011-10-29 MED ORDER — CEFAZOLIN SODIUM 1-5 GM-% IV SOLN: 1.0000 g | INTRAVENOUS | Status: DC

## 2011-10-29 MED ORDER — EPHEDRINE SULFATE 50 MG/ML IJ SOLN
INTRAMUSCULAR | Status: DC | PRN
  Administered 2011-10-29 (×2): 10 mg via INTRAVENOUS

## 2011-10-29 MED ORDER — EPHEDRINE SULFATE 50 MG/ML IJ SOLN
INTRAMUSCULAR | Status: AC
  Filled 2011-10-29: qty 1

## 2011-10-29 MED ORDER — GLYCOPYRROLATE 0.2 MG/ML IJ SOLN
INTRAMUSCULAR | Status: DC | PRN
  Administered 2011-10-29: 0.6 mg via INTRAVENOUS

## 2011-10-29 MED ORDER — TRAMADOL HCL 50 MG PO TABS
ORAL_TABLET | ORAL | Status: AC
  Administered 2011-10-29: 50 mg via ORAL
  Filled 2011-10-29: qty 1

## 2011-10-29 MED ORDER — ONDANSETRON HCL 4 MG/2ML IJ SOLN
4.0000 mg | Freq: Once | INTRAMUSCULAR | Status: AC | PRN
  Administered 2011-10-29: 4 mg via INTRAVENOUS

## 2011-10-29 MED ORDER — METHOCARBAMOL 500 MG PO TABS: 500.0000 mg | ORAL_TABLET | Freq: Four times a day (QID) | ORAL | Status: AC

## 2011-10-29 MED ORDER — MIDAZOLAM HCL 2 MG/2ML IJ SOLN
1.0000 mg | INTRAMUSCULAR | Status: DC | PRN
  Administered 2011-10-29: 2 mg via INTRAVENOUS

## 2011-10-29 MED ORDER — ROCURONIUM BROMIDE 100 MG/10ML IV SOLN
INTRAVENOUS | Status: DC | PRN
  Administered 2011-10-29: 35 mg via INTRAVENOUS

## 2011-10-29 MED ORDER — FENTANYL CITRATE 0.05 MG/ML IJ SOLN
INTRAMUSCULAR | Status: AC
  Filled 2011-10-29: qty 2

## 2011-10-29 MED ORDER — CEFAZOLIN SODIUM 1-5 GM-% IV SOLN
INTRAVENOUS | Status: AC
  Filled 2011-10-29: qty 50

## 2011-10-29 MED ORDER — PROPOFOL 10 MG/ML IV EMUL
INTRAVENOUS | Status: DC | PRN
  Administered 2011-10-29: 150 mg via INTRAVENOUS

## 2011-10-29 MED ORDER — CEFAZOLIN SODIUM 1-5 GM-% IV SOLN
INTRAVENOUS | Status: DC | PRN
  Administered 2011-10-29: 1 g via INTRAVENOUS

## 2011-10-29 MED ORDER — LIDOCAINE HCL (CARDIAC) 10 MG/ML IV SOLN
INTRAVENOUS | Status: DC | PRN
  Administered 2011-10-29: 50 mg via INTRAVENOUS

## 2011-10-29 MED ORDER — ACETAMINOPHEN 10 MG/ML IV SOLN
INTRAVENOUS | Status: AC
  Administered 2011-10-29: 1000 mg via INTRAVENOUS
  Filled 2011-10-29: qty 100

## 2011-10-29 MED ORDER — SUFENTANIL CITRATE 50 MCG/ML IV SOLN
INTRAVENOUS | Status: DC | PRN
  Administered 2011-10-29: 20 ug via INTRAVENOUS

## 2011-10-29 MED ORDER — BUPIVACAINE-EPINEPHRINE PF 0.5-1:200000 % IJ SOLN
INTRAMUSCULAR | Status: AC
  Filled 2011-10-29: qty 20

## 2011-10-29 MED ORDER — MIDAZOLAM HCL 2 MG/2ML IJ SOLN
INTRAMUSCULAR | Status: AC
  Filled 2011-10-29: qty 2

## 2011-10-29 MED ORDER — METHYLPREDNISOLONE SODIUM SUCC 125 MG IJ SOLR
INTRAMUSCULAR | Status: AC
  Filled 2011-10-29: qty 2

## 2011-10-29 MED ORDER — TRAMADOL HCL 50 MG PO TABS
50.0000 mg | ORAL_TABLET | Freq: Once | ORAL | Status: AC
  Administered 2011-10-29: 50 mg via ORAL

## 2011-10-29 MED ORDER — CELECOXIB 100 MG PO CAPS
400.0000 mg | ORAL_CAPSULE | Freq: Once | ORAL | Status: AC
  Administered 2011-10-29: 400 mg via ORAL

## 2011-10-29 MED ORDER — ROCURONIUM BROMIDE 50 MG/5ML IV SOLN
INTRAVENOUS | Status: AC
  Filled 2011-10-29: qty 1

## 2011-10-29 MED ORDER — PROPOFOL 10 MG/ML IV EMUL
INTRAVENOUS | Status: AC
  Filled 2011-10-29: qty 20

## 2011-10-29 MED ORDER — LACTATED RINGERS IV SOLN
INTRAVENOUS | Status: DC
  Administered 2011-10-29: 10:00:00 via INTRAVENOUS

## 2011-10-29 MED ORDER — LIDOCAINE HCL (PF) 1 % IJ SOLN
INTRAMUSCULAR | Status: AC
  Filled 2011-10-29: qty 5

## 2011-10-29 SURGICAL SUPPLY — 71 items
ANCH SUT 2 VT CRKSW 14.7X5.5 (Anchor) ×1 IMPLANT
ANCH SUT PUSHLCK 24X4.5 STRL (Orthopedic Implant) ×1 IMPLANT
ANCHOR CORKSCREW BIO 5.5 (Anchor) ×1 IMPLANT
BIT DRILL 2.0MX128MM (BIT) ×1 IMPLANT
BLADE HEX COATED 2.75 (ELECTRODE) ×2 IMPLANT
BLADE OSC/SAGITTAL MD 9X18.5 (BLADE) ×2 IMPLANT
BLADE SURG 15 STRL LF DISP TIS (BLADE) ×1 IMPLANT
BLADE SURG 15 STRL SS (BLADE) ×2
BLADE SURG SZ10 CARB STEEL (BLADE) ×2 IMPLANT
BNDG COHESIVE 4X5 TAN NS LF (GAUZE/BANDAGES/DRESSINGS) ×2 IMPLANT
BUR FAST CUTTING (BURR)
BUR ROUND 5.0 (BURR) IMPLANT
BUR SRG 54X4.7X12 FLUT (BURR) IMPLANT
BURR SRG 54X4.7X12 FLUT (BURR)
CHLORAPREP W/TINT 26ML (MISCELLANEOUS) ×2 IMPLANT
CLOTH BEACON ORANGE TIMEOUT ST (SAFETY) ×2 IMPLANT
COVER LIGHT HANDLE STERIS (MISCELLANEOUS) ×4 IMPLANT
COVER MAYO STAND XLG (DRAPE) ×2 IMPLANT
COVER PROBE W GEL 5X96 (DRAPES) ×2 IMPLANT
DECANTER SPIKE VIAL GLASS SM (MISCELLANEOUS) ×1 IMPLANT
DRAPE ORTHO 2.5IN SPLIT 77X108 (DRAPES) ×2 IMPLANT
DRAPE ORTHO SPLIT 77X108 STRL (DRAPES) ×4
DRAPE PROXIMA HALF (DRAPES) ×2 IMPLANT
DRAPE U-SHAPE 47X51 STRL (DRAPES) ×2 IMPLANT
DRESSING ALLEVYN BORDER 5X5 (GAUZE/BANDAGES/DRESSINGS) ×1 IMPLANT
ELECT REM PT RETURN 9FT ADLT (ELECTROSURGICAL) ×2
ELECTRODE REM PT RTRN 9FT ADLT (ELECTROSURGICAL) ×1 IMPLANT
FORMALIN 10 PREFIL 120ML (MISCELLANEOUS) ×1 IMPLANT
GLOVE ECLIPSE 6.5 STRL STRAW (GLOVE) ×2 IMPLANT
GLOVE EXAM NITRILE MD LF STRL (GLOVE) ×1 IMPLANT
GLOVE INDICATOR 7.0 STRL GRN (GLOVE) ×2 IMPLANT
GLOVE SKINSENSE NS SZ8.0 LF (GLOVE)
GLOVE SKINSENSE STRL SZ8.0 LF (GLOVE) IMPLANT
GLOVE SS N UNI LF 8.5 STRL (GLOVE) ×2 IMPLANT
GOWN STRL REIN XL XLG (GOWN DISPOSABLE) ×6 IMPLANT
INST SET MINOR BONE (KITS) ×2 IMPLANT
KIT BLADEGUARD II DBL (SET/KITS/TRAYS/PACK) ×1 IMPLANT
KIT ROOM TURNOVER APOR (KITS) ×2 IMPLANT
MANIFOLD NEPTUNE II (INSTRUMENTS) ×2 IMPLANT
MARKER SKIN DUAL TIP RULER LAB (MISCELLANEOUS) ×2 IMPLANT
NDL HYPO 21X1.5 SAFETY (NEEDLE) ×1 IMPLANT
NDL MA TROC 1/2 (NEEDLE) IMPLANT
NDL MAYO 6 CRC TAPER PT (NEEDLE) IMPLANT
NEEDLE HYPO 21X1.5 SAFETY (NEEDLE) ×2 IMPLANT
NEEDLE MA TROC 1/2 (NEEDLE) IMPLANT
NEEDLE MAYO 6 CRC TAPER PT (NEEDLE) IMPLANT
NS IRRIG 1000ML POUR BTL (IV SOLUTION) ×2 IMPLANT
PACK BASIC III (CUSTOM PROCEDURE TRAY) ×2
PACK SRG BSC III STRL LF ECLPS (CUSTOM PROCEDURE TRAY) ×1 IMPLANT
PAD ARMBOARD 7.5X6 YLW CONV (MISCELLANEOUS) ×2 IMPLANT
PASSER SUT CAPTURE FIRST (SUTURE) IMPLANT
PENCIL HANDSWITCHING (ELECTRODE) ×2 IMPLANT
PUSHLOCK PEEK 4.5X24 (Orthopedic Implant) ×1 IMPLANT
RASP SM TEAR CROSS CUT (RASP) ×1 IMPLANT
SET BASIN LINEN APH (SET/KITS/TRAYS/PACK) ×2 IMPLANT
SLING ARM FOAM STRAP LRG (SOFTGOODS) ×1 IMPLANT
SLING ARM FOAM STRAP MED (SOFTGOODS) ×2 IMPLANT
SLING ARM FOAM STRAP XLG (SOFTGOODS) ×1 IMPLANT
SPONGE LAP 18X18 X RAY DECT (DISPOSABLE) ×3 IMPLANT
STAPLER VISISTAT 35W (STAPLE) ×1 IMPLANT
STOCKINETTE IMPERVIOUS LG (DRAPES) ×2 IMPLANT
STRIP CLOSURE SKIN 1/2X4 (GAUZE/BANDAGES/DRESSINGS) ×1 IMPLANT
SUT BONE WAX W31G (SUTURE) IMPLANT
SUT ETHIBOND NAB OS 4 #2 30IN (SUTURE) ×4 IMPLANT
SUT ETHILON 3 0 FSL (SUTURE) ×1 IMPLANT
SUT MON AB 0 CT1 (SUTURE) ×3 IMPLANT
SUT MON AB 2-0 CT1 36 (SUTURE) ×2 IMPLANT
SUT PROLENE 3 0 PS 1 (SUTURE) IMPLANT
SYR 30ML LL (SYRINGE) ×2 IMPLANT
SYR BULB IRRIGATION 50ML (SYRINGE) ×4 IMPLANT
YANKAUER SUCT 12FT TUBE ARGYLE (SUCTIONS) ×2 IMPLANT

## 2011-10-29 NOTE — Anesthesia Preprocedure Evaluation (Signed)
Anesthesia Evaluation  Patient identified by MRN, date of birth, ID band Patient awake    Reviewed: Allergy & Precautions, H&P , NPO status , Patient's Chart, lab work & pertinent test results  Airway Mallampati: I TM Distance: >3 FB Neck ROM: Full    Dental No notable dental hx.    Pulmonary          Cardiovascular hypertension, Pt. on medications Regular Normal    Neuro/Psych  Neuromuscular disease    GI/Hepatic GERD- (pt denies esophageal reflux, not on medication)  ,  Endo/Other  Diabetes mellitus- (diet controlled), Type 2  Renal/GU      Musculoskeletal   Abdominal Normal abdominal exam  (+)   Peds  Hematology  (+) Blood dyscrasia, anemia ,   Anesthesia Other Findings   Reproductive/Obstetrics negative OB ROS                           Anesthesia Physical Anesthesia Plan  ASA: II  Anesthesia Plan: General   Post-op Pain Management:    Induction: Intravenous  Airway Management Planned: Oral ETT  Additional Equipment:   Intra-op Plan:   Post-operative Plan: Extubation in OR  Informed Consent: I have reviewed the patients History and Physical, chart, labs and discussed the procedure including the risks, benefits and alternatives for the proposed anesthesia with the patient or authorized representative who has indicated his/her understanding and acceptance.   Dental advisory given  Plan Discussed with: CRNA  Anesthesia Plan Comments:         Anesthesia Quick Evaluation

## 2011-10-29 NOTE — Transfer of Care (Signed)
Immediate Anesthesia Transfer of Care Note  Patient: Ann Vasquez  Procedure(s) Performed: Procedure(s) (LRB): ROTATOR CUFF REPAIR SHOULDER OPEN (Left)  Patient Location: PACU  Anesthesia Type: General  Level of Consciousness: awake, alert , oriented and patient cooperative  Airway & Oxygen Therapy: Patient Spontanous Breathing and Patient connected to face mask oxygen  Post-op Assessment: Report given to PACU RN and Post -op Vital signs reviewed and stable  Post vital signs: Reviewed and stable  Complications: No apparent anesthesia complications

## 2011-10-29 NOTE — Anesthesia Postprocedure Evaluation (Signed)
  Anesthesia Post-op Note  Patient: Ann Vasquez  Procedure(s) Performed: Procedure(s) (LRB): ROTATOR CUFF REPAIR SHOULDER OPEN (Left)  Patient Location: PACU  Anesthesia Type: General  Level of Consciousness: awake, alert , oriented and patient cooperative  Airway and Oxygen Therapy: Patient Spontanous Breathing and Patient connected to face mask oxygen  Post-op Pain: none  Post-op Assessment: Post-op Vital signs reviewed, Patient's Cardiovascular Status Stable, Respiratory Function Stable, Patent Airway and No signs of Nausea or vomiting  Post-op Vital Signs: Reviewed and stable  Complications: No apparent anesthesia complications

## 2011-10-29 NOTE — Op Note (Signed)
Preop diagnosis rotator cuff tear left shoulder biceps tendon tear left shoulder   Postop diagnosis same  Procedure open left rotator cuff repair with acromioplasty  Surgeon Romeo Apple  Assisted by [place]  Operative findings there was a V-shaped tear of the rotator cuff involving primarily the supraspinatus tendon. The biceps tendon was torn and retracted at the level of the transverse bicipital ligament.  The cuff tissue was of good quality  Primary indication persistent pain left shoulder with weakness and failure to respond to nonoperative treatment  Implants bilateral composite corkscrew 5.5 mm Arthrex anchor Implant 4.5 push lock suture anchor Arthrex  The patient was identified in the preop area the site was confirmed and marked. The chart was reviewed. The patient was taken to the operating room given appropriate amount of Ancef and was intubated  In the modified beachchair position with a sandbag under the left shoulder the left arm was then prepped and draped sterilely  A longitudinal incision was made over the anterolateral acromion. Subcutaneous tissue was divided. Fascia was split in line with the skin incision taken up to the acromion followed by subperiosteal dissection exposing the anterolateral acromial bone. A small acromioplasty was performed. A bursectomy was performed. The wound is irrigated. The biceps tendon was not found in its normal position and had retracted to the level of the transverse biceps ligament  The joint was then irrigated. The tear was U-shaped/V-shaped. Side to side sutures with #2 Ethibond x3 were placed and the tendon of the supraspinatus. The greater tuberosity was debrided until the bony portion was bleeding. The corkscrew was placed per technique first using a punch followed by a tap followed by the corkscrew anchor which was double loaded with sutures. The 2 sutures were passed into the rotator cuff. The 2 suture limbs were then loaded and a  push lock anchor which was placed per technique with a punch and loading of the anchor into the bone.  Per standard protocol the corkscrew anchor was tested for stability by pulling on the sutures. The suture anchor of the push lock was tested in the same manner.  The wound is irrigated and 2 drill holes were placed in the acromion the fascia of the deltoid was repaired with 2 #2 Ethibond sutures  Then a 0 Monocryl suture was used to repair the deltoid split tear wound closure was performed by first injecting the subdeltoid layer with 30 cc of Marcaine and epinephrine. We then followed this with 0 and 2-0 Monocryl. 30 cc of Marcaine was injected into the subcutaneous tissues. a running 3-0 nylon suture was placed which can be removed at 7 days.  Sterile dressing was applied followed by application of a sling  The patient will start therapy in 3 weeks.

## 2011-10-29 NOTE — Preoperative (Signed)
Beta Blockers   Reason not to administer Beta Blockers:Not Applicable 

## 2011-10-29 NOTE — Interval H&P Note (Signed)
History and Physical Interval Note:  10/29/2011 8:59 AM  Ann Vasquez  has presented today for surgery, with the diagnosis of left rotator cuff tear and biceps tear  The various methods of treatment have been discussed with the patient and family. After consideration of risks, benefits and other options for treatment, the patient has consented to  Procedure(s) (LRB):LEFT ROTATOR CUFF REPAIR SHOULDER OPEN (Left) as a surgical intervention .  The patients' history has been reviewed, patient examined, no change in status, stable for surgery.  I have reviewed the patients' chart and labs.  Questions were answered to the patient's satisfaction.     Fuller Canada

## 2011-10-29 NOTE — Brief Op Note (Signed)
10/29/2011  11:31 AM  PATIENT:  Ann Vasquez  66 y.o. female  PRE-OPERATIVE DIAGNOSIS:  left rotator cuff tear and biceps tear  POST-OPERATIVE DIAGNOSIS:  left rotator cuff tear and biceps tear  PROCEDURE:  Procedure(s) (LRB): ROTATOR CUFF REPAIR SHOULDER OPEN (Left)  SURGEON:  Surgeon(s) and Role:    * Fuller Canada, MD - Primary  PHYSICIAN ASSISTANT:   ASSISTANTS: BETTY ASHLEY   ANESTHESIA:   general  EBL:  Total I/O In: 1000 [I.V.:1000] Out: 50 [Blood:50]  BLOOD ADMINISTERED:none  DRAINS: none   LOCAL MEDICATIONS USED:  MARCAINE   WITH EPI  and Amount: 60 ml  SPECIMEN:  No Specimen  DISPOSITION OF SPECIMEN:  N/A  COUNTS:  YES  TOURNIQUET:  * No tourniquets in log *  DICTATION: .Dragon Dictation  PLAN OF CARE: Discharge to home after PACU  PATIENT DISPOSITION:  PACU - hemodynamically stable.   Delay start of Pharmacological VTE agent (>24hrs) due to surgical blood loss or risk of bleeding: not applicable

## 2011-10-29 NOTE — Anesthesia Procedure Notes (Signed)
Procedure Name: Intubation Date/Time: 10/29/2011 10:03 AM Performed by: Carolyne Littles, Travanti Mcmanus Pre-anesthesia Checklist: Patient identified, Timeout performed, Suction available, Patient being monitored and Emergency Drugs available Patient Re-evaluated:Patient Re-evaluated prior to inductionOxygen Delivery Method: Circle system utilized Preoxygenation: Pre-oxygenation with 100% oxygen Intubation Type: IV induction Ventilation: Mask ventilation without difficulty Laryngoscope Size: 3 and Miller Grade View: Grade I Tube type: Oral Tube size: 7.0 mm Number of attempts: 1 Airway Equipment and Method: stylet Placement Confirmation: ETT inserted through vocal cords under direct vision,  positive ETCO2 and breath sounds checked- equal and bilateral Secured at: 21 cm Tube secured with: Tape Dental Injury: Teeth and Oropharynx as per pre-operative assessment

## 2011-11-01 ENCOUNTER — Encounter: Payer: Self-pay | Admitting: Orthopedic Surgery

## 2011-11-01 ENCOUNTER — Ambulatory Visit (INDEPENDENT_AMBULATORY_CARE_PROVIDER_SITE_OTHER): Payer: BC Managed Care – PPO | Admitting: Orthopedic Surgery

## 2011-11-01 VITALS — BP 126/80 | Ht 59.0 in | Wt 139.0 lb

## 2011-11-01 DIAGNOSIS — Z9889 Other specified postprocedural states: Secondary | ICD-10-CM

## 2011-11-01 NOTE — Progress Notes (Signed)
Patient ID: LAJOYA DOMBEK, female   DOB: Jul 20, 1946, 66 y.o.   MRN: [id number] Chief Complaint  Patient presents with  . Routine Post Op    post op 1 left rotator cuff, DOS 10/29/11    Preop diagnosis rotator cuff tear left shoulder biceps tendon tear left shoulder  Postop diagnosis same  Procedure open left rotator cuff repair with acromioplasty  Surgeon Romeo Apple  Assisted by [place] [facility]  Operative findings there was a V-shaped tear of the rotator cuff involving primarily the supraspinatus tendon. The biceps tendon was torn and retracted at the level of the transverse bicipital ligament.  The cuff tissue was of good quality  Primary indication persistent pain left shoulder with weakness and failure to respond to nonoperative treatment  Implants bilateral composite corkscrew 5.5 mm Arthrex anchor  Implant 4.5 push lock suture anchor Arthrex  First postop visit  Mild nausea.  Incision looks clean.  Patient placed in abduction slingshot brace  Sutures out 10 days.  Start  therapy  3 weeks after surgery

## 2011-11-01 NOTE — Patient Instructions (Addendum)
Rotator cuff repair instructions  You will be in a sling and abduction pillow for 3 weeks.  You  will  remove the sling 3 times a day and straighten your arm,  keeping your arm close to your body  You can use  the exercise ball 3 times a day.  Continue to use ice as needed for swelling.

## 2011-11-02 ENCOUNTER — Encounter (HOSPITAL_COMMUNITY): Payer: Self-pay | Admitting: Orthopedic Surgery

## 2011-11-03 ENCOUNTER — Other Ambulatory Visit: Payer: Self-pay | Admitting: *Deleted

## 2011-11-03 MED ORDER — HYDROCODONE-ACETAMINOPHEN 5-325 MG PO TABS: 1.0000 | ORAL_TABLET | ORAL | Status: AC | PRN

## 2011-11-10 ENCOUNTER — Ambulatory Visit (INDEPENDENT_AMBULATORY_CARE_PROVIDER_SITE_OTHER): Payer: BC Managed Care – PPO | Admitting: Orthopedic Surgery

## 2011-11-10 ENCOUNTER — Encounter: Payer: Self-pay | Admitting: Orthopedic Surgery

## 2011-11-10 VITALS — BP 124/70 | Ht 59.0 in | Wt 139.0 lb

## 2011-11-10 DIAGNOSIS — Z9889 Other specified postprocedural states: Secondary | ICD-10-CM

## 2011-11-10 DIAGNOSIS — S43429A Sprain of unspecified rotator cuff capsule, initial encounter: Secondary | ICD-10-CM

## 2011-11-10 DIAGNOSIS — M75102 Unspecified rotator cuff tear or rupture of left shoulder, not specified as traumatic: Secondary | ICD-10-CM

## 2011-11-10 NOTE — Patient Instructions (Signed)
START THERAPY NEXT Monday   WEAR SLING FULL TIME FOR 1 WEEKS THEN WHEN OUT OF THE HOUSE FOR 3 WEEKS MORE (REMOVE SLING 6 WEEK FROM SURGERY)

## 2011-11-10 NOTE — Progress Notes (Signed)
Patient ID: Ann Vasquez, female   DOB: 1945/10/02, 65 y.o.   MRN: [id number] Chief Complaint  Patient presents with  . Follow-up    Post op left RCR. 3.1.13      Postop rotator cuff repair LEFT shoulder doing well.  Sutures out today wound looks clean  Patient is advised to start therapy next week

## 2011-11-17 ENCOUNTER — Ambulatory Visit (HOSPITAL_COMMUNITY)
Admission: RE | Admit: 2011-11-17 | Discharge: 2011-11-17 | Disposition: A | Discharge status: Home / Self Care | Payer: BC Managed Care – PPO | Source: Ambulatory Visit | Attending: Orthopedic Surgery | Admitting: Orthopedic Surgery

## 2011-11-17 DIAGNOSIS — E119 Type 2 diabetes mellitus without complications: Secondary | ICD-10-CM | POA: Insufficient documentation

## 2011-11-17 DIAGNOSIS — M25619 Stiffness of unspecified shoulder, not elsewhere classified: Secondary | ICD-10-CM | POA: Insufficient documentation

## 2011-11-17 DIAGNOSIS — M6281 Muscle weakness (generalized): Secondary | ICD-10-CM | POA: Insufficient documentation

## 2011-11-17 DIAGNOSIS — IMO0001 Reserved for inherently not codable concepts without codable children: Secondary | ICD-10-CM | POA: Insufficient documentation

## 2011-11-17 DIAGNOSIS — E785 Hyperlipidemia, unspecified: Secondary | ICD-10-CM | POA: Insufficient documentation

## 2011-11-17 DIAGNOSIS — M25519 Pain in unspecified shoulder: Secondary | ICD-10-CM | POA: Insufficient documentation

## 2011-11-17 NOTE — Evaluation (Signed)
Physical Therapy Evaluation  Patient Details  Name: Ann Vasquez MRN: [id number] Date of Birth: 28-Jan-1946  Today's Date: 11/17/2011 Time: 2952-8413 Time Calculation (min): 45 min Charges: 1 eval Visit#: 1  of 12   Re-eval: 12/17/11 Assessment Diagnosis: Lt RTC repair Surgical Date: 10/29/11 Next MD Visit: 12/08/11 Prior Therapy: Conservative treatment 2 years ago  Past Medical History:  Past Medical History  Diagnosis Date  . HTN (hypertension)   . High cholesterol   . Diabetes mellitus type 2, diet-controlled    Past Surgical History:  Past Surgical History  Procedure Date  . Right shoulder 2011    Ann Vasquez, [facility]  . Shoulder surgery   . Shoulder open rotator cuff repair 10/29/2011    Procedure: ROTATOR CUFF REPAIR SHOULDER OPEN;  Surgeon: Ann Canada, MD;  Location: [facility];  Service: Orthopedics;  Laterality: Left;    Subjective Symptoms/Limitations Symptoms: Pt is referred to PT s/p left RTC repair on 10/29/11.  She reports that her pain ranges from 3-10/10.  Her c/co is decreased indepence with ADL's, work and increased sharp pain to her shoulder.  Alleviating: Pain medication, ice; Aggrevating: Active motion. Nature: Burning Pain, Surgical Pain. PMH: HTN, border line diabetes, hyperlipidema Limitations: Writing;Lifting;Reading;Sitting;House hold activities (R hand dominant) How long can you sit comfortably?: w/o sling 1 hour How long can you stand comfortably?: w/o sling 10 minutes How long can you walk comfortably?: w/o sling 15 minutes Pain Assessment Currently in Pain?: Yes Pain Score:   3 Pain Location: Shoulder Pain Orientation: Left Pain Type: Surgical pain;Acute pain  Precautions/Restrictions  Precautions Precaution Comments: Follow Ann Vasquez RTC protocol. 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 Leisure: Hobbies-yes (Comment) Comments: She enjoys sewing, word puzzels. Completes her own housework   Cognition/Observation Observation/Other Assessments Observations: Comes in today with her sling.  She reports she is wearing some of the time at home when it becomes heavy and sore.  Increased UT activations to complete joint approximation.   Sensation/Coordination/Flexibility/Functional Tests Functional Tests Functional Tests: Upper Extremity Functional Scale: 16/80  Assessment LUE AROM (degrees) Left Shoulder Extension  0-60: 0 Degrees Left Shoulder Flexion  0-170: 0 Degrees Left Shoulder ABduction 0-40: 0 Degrees Left Shoulder Internal Rotation  0-70: 0 Degrees Left Shoulder External Rotation  0-90: 0 Degrees LUE PROM (degrees) Left Shoulder Extension  0-60: 20 Degrees Left Shoulder Flexion  0-170: 70 Degrees Left Shoulder ABduction 0-40: 43 Degrees Left Shoulder Internal Rotation  0-70: 10 Degrees Left Shoulder External Rotation  0-90: 5 Degrees LUE Strength Left Shoulder Flexion: 2/5 Left Shoulder Extension: 3-/5 Left Shoulder ABduction: 2/5 Left Shoulder Internal Rotation: 2/5 Left Shoulder External Rotation: 2/5 Left Elbow Flexion: 4/5 Left Elbow Extension: 4/5 Palpation Palpation: Increased pain and tenderness with fascial restrcitons to the deltoid tuberosity,  Increased muscle spasms to L scapular and UT region.   Exercise/Treatments  Other ROM/Strengthening Exercises: Elbow flexion 10x, wrist flexion and extension x10 Other ROM/Strengthening Exercises: Shoulder approximation 5x10 sec holds  Physical Therapy Assessment and Plan PT Assessment and Plan Clinical Impression Statement: Ann Vasquez is a 66 year old female referred to PT s/p L RTC  repair on 10/29/11.  after evaluation it was found she has current impairments including decreased L: shoulder A/P ROM, decreased strength, increased fascial restrictions, impaired coordination to scapular stabalizers, increased pain, and impaired percieved functional ability which is limiting her in completing necessary ADL's and IADL's incluiding work.  She will benefit from skilled OP PT in order to address above impairments in order to maximize function and reach goals.  Rehab Potential: Good PT Frequency: Min 3X/week PT Duration: Other (comment) (12 weeks) PT Treatment/Interventions: Therapeutic exercise;Therapeutic activities;Neuromuscular re-education;Patient/family education;Other (comment) (manual techniques, joint mobs, modalities PRN) PT Plan: Start gentle PROM, follow shoulder protocol (PROM for first 3 weeks), scapular stabilization, continue with low level elbow and wrist exercises WITHOUT weight.    Goals Home Exercise Program Pt will Perform Home Exercise Program: Independently PT Goal: Perform Home Exercise Program - Progress: Goal set today PT Short Term Goals Time to Complete Short Term Goals: 4 weeks PT Short Term Goal 1: Pt will improve L shoulder PROM: flexion 0-100; abduction 0-90; ER: 0-25; IR: 0-45 PT Short Term Goal 2: Pt will present with decreased fascial restrcitions. PT Short Term Goal 3: Pt will report pain less than 5/10 for 50% of her day to decrease dependence on pain medication.  PT Short Term Goal 4: Pt will improve her scapular and shoulder stabalizer endurance and demonstrate 10 shoulder approximations x10 sec without cueing.  PT Long Term Goals Time to Complete Long Term Goals: 12 weeks PT Long Term Goal 1: Pt will improve shoulder AROM to WNL in order to complete necessary ADL's. PT Long Term Goal 2: Pt will improve shoulder strength to WNL and demonstrate lifting a 20lb pan from counter to transfer to another counter to simulate return to work activities.    Long Term Goal 3: Pt will improve her UEFS score to 60/80 for improved percieved functional ability.   Problem List Patient Active Problem List  Diagnoses  . FIBROIDS, UTERUS  . DM  . HYPERLIPIDEMIA  . ANEMIA, IRON DEFICIENCY, MICROCYTIC  . GERD  . IMPINGEMENT SYNDROME  . RUPTURE ROTATOR CUFF  . Back pain  . Bursitis of shoulder, left  . Rotator cuff tear  . Biceps tendon rupture, proximal  . Rotator cuff tear, left  . S/P shoulder surgery  . Shoulder pain  . Shoulder stiffness  . Muscle weakness (generalized)    PT Plan of Care PT Home Exercise Plan: see scanned document.  All exercises completed without weight.s  Consulted and Agree with Plan of Care: Patient   Demetrica Zipp 11/17/2011, 2:54 PM  Physician Documentation Your signature is required to indicate approval of the treatment plan as stated above.  Please sign and either send electronically or make a copy of this report for your files and return this physician signed original.   Please mark one 1.__approve of plan  2. ___approve of plan with the following conditions.   ______________________________                                                          _____________________ Physician Signature  Date  

## 2011-11-18 NOTE — Addendum Note (Signed)
Addendum  created 11/18/11 1636 by Franco Nones, CRNA   Modules edited:Charges VN

## 2011-11-23 ENCOUNTER — Ambulatory Visit (HOSPITAL_COMMUNITY)
Admission: RE | Admit: 2011-11-23 | Discharge: 2011-11-23 | Disposition: A | Discharge status: Home / Self Care | Payer: BC Managed Care – PPO | Source: Ambulatory Visit | Attending: Orthopedic Surgery | Admitting: Orthopedic Surgery

## 2011-11-23 NOTE — Progress Notes (Signed)
Physical Therapy Treatment Patient Details  Name: Ann Vasquez MRN: [id number] Date of Birth: Sep 30, 1945  Today's Date: 11/23/2011 Time: 7829-5621 Time Calculation (min): 42 min Visit#: 2  of 12   Re-eval: 12/17/11 Diagnosis: Lt RTC repair Surgical Date: 10/29/11 Next MD Visit: 12/08/11 Charges:  Therex 12', manual 20', ice 10'  Subjective: Symptoms/Limitations Symptoms: Pt. states she's having some pain today, 4/10. Pain Assessment Currently in Pain?: Yes Pain Score:   4 Pain Location: Shoulder Pain Orientation: Left  Precautions/Restrictions  Precautions Precautions: Other (comment) Precaution Comments: Follow Dr. Romeo Apple RTC protocol. 6 weeks of sling use from date of surgery, 6 weeks PROM from date of surgery  Exercise/Treatments Supine External Rotation: PROM Internal Rotation: PROM Flexion: PROM ABduction: PROM Other Supine Exercises: scapular mobs Seated Elevation: 10 reps Retraction: 10 reps Standing Other Standing Exercises: pendulum X 1 minute Therapy Ball Flexion: 10 reps ABduction: 10 reps ROM / Strengthening / Isometric Strengthening Other ROM/Strengthening Exercises: Elbow flexion 10x, wrist flexion and extension x10 Other ROM/Strengthening Exercises: Shoulder approximation 5x10 sec holds    Physical Therapy Assessment and Plan PT Assessment and Plan Clinical Impression Statement: Added AROM gentle ball stretch for flexion and abduction.  Pt. with decreased tolerance for PROM but able to increase with each rep.  Instructed with pendulums (per protocol) to add to HEP. PT Plan: continue to progress per protocol.  Do not add weight.     Problem List Patient Active Problem List  Diagnoses  . FIBROIDS, UTERUS  . DM  . HYPERLIPIDEMIA  . ANEMIA, IRON DEFICIENCY, MICROCYTIC  . GERD  . IMPINGEMENT SYNDROME  . RUPTURE ROTATOR CUFF  . Back pain  . Bursitis of shoulder, left  . Rotator cuff tear  . Biceps tendon rupture, proximal  . Rotator  cuff tear, left  . S/P shoulder surgery  . Shoulder pain  . Shoulder stiffness  . Muscle weakness (generalized)    PT - End of Session Activity Tolerance: Patient tolerated treatment well General Behavior During Session: [facility] for tasks performed Cognition: [facility] for tasks performed   Jocsan Mcginley B. Bascom Levels, PTA 11/23/2011, 12:46 PM

## 2011-11-24 ENCOUNTER — Ambulatory Visit (HOSPITAL_COMMUNITY)
Admission: RE | Admit: 2011-11-24 | Discharge: 2011-11-24 | Disposition: A | Discharge status: Home / Self Care | Payer: BC Managed Care – PPO | Source: Ambulatory Visit | Attending: Internal Medicine | Admitting: Internal Medicine

## 2011-11-24 NOTE — Progress Notes (Signed)
Physical Therapy Treatment Patient Details  Name: Ann Vasquez MRN: [id number] Date of Birth: 05/06/1946  Today's Date: 11/24/2011 Time: 4098-1191 Time Calculation (min): 45 min Visit#: 3  of 12   Re-eval: 12/17/11  Charge: manual 15 min therex 20 Ice 10 min  Subjective: Symptoms/Limitations Symptoms: Pt stated she is pain free at entrance, had pain meds at 6:30 this am (apt at 1:45). Pain Assessment Currently in Pain?: No/denies  Objective:   Exercise/Treatments Supine External Rotation: PROM Internal Rotation: PROM Flexion: PROM ABduction: PROM Other Supine Exercises: scapular mobs Seated Elevation: 10 reps Retraction: 10 reps Other Seated Exercises: Elbow flexion 10x, wrist flexion and extension x10 Other Seated Exercises: Shoulder approximation 5x10 sec holds Standing Other Standing Exercises: pendulum X 1 minute Therapy Ball Flexion: 10 reps ABduction: 10 reps Right/Left: Limitations Right/Left Limitations: next session   Modalities Modalities: Cryotherapy Cryotherapy Number Minutes Cryotherapy: 10 Minutes Cryotherapy Location: Shoulder Type of Cryotherapy: Ice pack  Physical Therapy Assessment and Plan PT Assessment and Plan Clinical Impression Statement: Pt tolerated well towards total treatment with decreased tolerance for PROM but able to increase with each rep.  Pt stated pain reduced with scapular mobs and ice at end of session.  Min cueing required with AAROM exercise to deactive UT to utilize correct musculature/ PT Plan: Continue to progress per protocol, do not add weight.    Goals    Problem List Patient Active Problem List  Diagnoses  . FIBROIDS, UTERUS  . DM  . HYPERLIPIDEMIA  . ANEMIA, IRON DEFICIENCY, MICROCYTIC  . GERD  . IMPINGEMENT SYNDROME  . RUPTURE ROTATOR CUFF  . Back pain  . Bursitis of shoulder, left  . Rotator cuff tear  . Biceps tendon rupture, proximal  . Rotator cuff tear, left  . S/P shoulder surgery  .  Shoulder pain  . Shoulder stiffness  . Muscle weakness (generalized)    PT - End of Session Activity Tolerance: Patient tolerated treatment well General Behavior During Session: [facility] for tasks performed Cognition: [facility] for tasks performed  GP No functional reporting required  Juel Burrow, PTA 11/24/2011, 2:31 PM

## 2011-11-25 ENCOUNTER — Ambulatory Visit (HOSPITAL_COMMUNITY)
Admission: RE | Admit: 2011-11-25 | Discharge: 2011-11-25 | Disposition: A | Discharge status: Home / Self Care | Payer: BC Managed Care – PPO | Source: Ambulatory Visit | Attending: Internal Medicine | Admitting: Internal Medicine

## 2011-11-25 NOTE — Progress Notes (Signed)
Physical Therapy Treatment Patient Details  Name: Ann Vasquez MRN: [id number] Date of Birth: 1945-12-05  Today's Date: 11/25/2011 Time: 0865-7846 Time Calculation (min): 50 min Visit#: 4  of 12   Re-eval: 12/17/11  Charge: therex 28 Manual 12 min Ice 10 min  Subjective: Symptoms/Limitations Symptoms: Pt pain free at entrance, stated pain meds take at 11 before session. Pain Assessment Currently in Pain?: No/denies  Objective:   Exercise/Treatments Supine External Rotation: PROM;AAROM;5 reps Internal Rotation: PROM;AAROM;5 reps Flexion: PROM ABduction: PROM Other Supine Exercises: scapular mobs Seated Elevation: 10 reps Retraction: 10 reps;Limitations Retraction Limitations: scapular 10 reps Other Seated Exercises: Elbow flexion 10x, wrist flexion and extension x10 Other Seated Exercises: Shoulder approximation 10x10 sec holds Therapy Ball Flexion: 10 reps ABduction: 10 reps Right/Left: Limitations Right/Left Limitations: trial, too painful try again later  Physical Therapy Assessment and Plan PT Assessment and Plan Clinical Impression Statement: Cueing required with AAROM exercises to deactive UT to utilize correct musculature.  Able to increase PROM each repetition within pain tolerance.  Tried to add clockwise/CCW with ball to increase IR and ER, too painful unable to complete. PT Plan: Continue progressing per RTC protocol.    Goals    Problem List Patient Active Problem List  Diagnoses  . FIBROIDS, UTERUS  . DM  . HYPERLIPIDEMIA  . ANEMIA, IRON DEFICIENCY, MICROCYTIC  . GERD  . IMPINGEMENT SYNDROME  . RUPTURE ROTATOR CUFF  . Back pain  . Bursitis of shoulder, left  . Rotator cuff tear  . Biceps tendon rupture, proximal  . Rotator cuff tear, left  . S/P shoulder surgery  . Shoulder pain  . Shoulder stiffness  . Muscle weakness (generalized)    PT - End of Session Activity Tolerance: Patient tolerated treatment well General Behavior  During Session: [facility] for tasks performed Cognition: [facility] for tasks performed  GP No functional reporting required  Juel Burrow, PTA 11/25/2011, 2:32 PM

## 2011-11-29 ENCOUNTER — Ambulatory Visit (HOSPITAL_COMMUNITY)
Admission: RE | Admit: 2011-11-29 | Discharge: 2011-11-29 | Disposition: A | Discharge status: Home / Self Care | Payer: BC Managed Care – PPO | Source: Ambulatory Visit | Attending: Internal Medicine | Admitting: Internal Medicine

## 2011-11-29 DIAGNOSIS — IMO0001 Reserved for inherently not codable concepts without codable children: Secondary | ICD-10-CM | POA: Insufficient documentation

## 2011-11-29 DIAGNOSIS — M6281 Muscle weakness (generalized): Secondary | ICD-10-CM | POA: Insufficient documentation

## 2011-11-29 DIAGNOSIS — E119 Type 2 diabetes mellitus without complications: Secondary | ICD-10-CM | POA: Insufficient documentation

## 2011-11-29 DIAGNOSIS — M25519 Pain in unspecified shoulder: Secondary | ICD-10-CM | POA: Insufficient documentation

## 2011-11-29 DIAGNOSIS — E785 Hyperlipidemia, unspecified: Secondary | ICD-10-CM | POA: Insufficient documentation

## 2011-11-29 DIAGNOSIS — M25619 Stiffness of unspecified shoulder, not elsewhere classified: Secondary | ICD-10-CM | POA: Insufficient documentation

## 2011-11-29 NOTE — Progress Notes (Signed)
Physical Therapy Treatment Patient Details  Name: Ann Vasquez MRN: [id number] Date of Birth: February 25, 1946  Today's Date: 11/29/2011 Time: 4098-1191 Time Calculation (min): 53 min Charges: 38' manual, 5' TE, 1 ice Visit#: 5  of 12      Subjective: Symptoms/Limitations Symptoms: Pt reports that she is doing pretty well overall.  She has some swelling to her LUE into her wrist. Pain Assessment Currently in Pain?: No/denies  Precautions/Restrictions     Exercise/Treatments Supine Flexion: AAROM;10 reps (extending forearm and elevating shoulder) Seated Retraction: 10 reps (10 sec holds) Retraction Limitations: scapula Other Seated Exercises: Shoulder approximation 10x10 sec holds Therapy Ball Flexion: 15 reps Educated patient about appropriate sling wear.   Modalities Modalities: Cryotherapy Manual Therapy Manual Therapy: Joint mobilization Joint Mobilization: Grade I GH to decrease pain and improve shoulder flexion and Er Other Manual Therapy: PROM all directions w/gentle osscilations.  Cryotherapy Number Minutes Cryotherapy: 10 Minutes Cryotherapy Location: Shoulder Type of Cryotherapy: Ice pack  Physical Therapy Assessment and Plan PT Assessment and Plan Clinical Impression Statement: Cont to improve her overall PROM with limited pain.  Has improved motor control with shoulder approximation, however notable muscular fatigue by the end to her RTC musculature.  PT Plan: Cont to progress per protocol.     Goals    Problem List Patient Active Problem List  Diagnoses  . FIBROIDS, UTERUS  . DM  . HYPERLIPIDEMIA  . ANEMIA, IRON DEFICIENCY, MICROCYTIC  . GERD  . IMPINGEMENT SYNDROME  . RUPTURE ROTATOR CUFF  . Back pain  . Bursitis of shoulder, left  . Rotator cuff tear  . Biceps tendon rupture, proximal  . Rotator cuff tear, left  . S/P shoulder surgery  . Shoulder pain  . Shoulder stiffness  . Muscle weakness (generalized)       GP No functional  reporting required  Fern Asmar 11/29/2011, 3:36 PM

## 2011-12-01 ENCOUNTER — Ambulatory Visit (HOSPITAL_COMMUNITY)
Admission: RE | Admit: 2011-12-01 | Discharge: 2011-12-01 | Disposition: A | Discharge status: Home / Self Care | Payer: BC Managed Care – PPO | Source: Ambulatory Visit | Attending: Internal Medicine | Admitting: Internal Medicine

## 2011-12-01 NOTE — Progress Notes (Signed)
Physical Therapy Treatment Patient Details  Name: Ann Vasquez MRN: [id number] Date of Birth: 02/19/1946  Today's Date: 12/01/2011 Time: 3790-2409 Time Calculation (min): 55 min Visit#: 6  of 12   Re-eval: 12/17/11  Charge: therex 25 min Manual 20 min Ice 10 min  Subjective: Symptoms/Limitations Symptoms: No pain L UE at entrance. Pain Assessment Currently in Pain?: No/denies   Exercise/Treatments Supine External Rotation: PROM;Limitations External Rotation Limitations: 3x 30" with gentle osscilations Internal Rotation: PROM;Limitations Internal Rotation Limitations: 3x 30" with gentle osscilations Flexion: AAROM;10 reps;PROM;Limitations Flexion Limitations: 3x 30" with gentle osscilations ABduction: PROM;Limitations ABduction Limitations: 3x 30" with gentle osscilations Other Supine Exercises: scapular mobs Seated Retraction: 10 reps Retraction Limitations: scapula Other Seated Exercises: Shoulder approximation 10x10 sec holds Therapy Ball Flexion: 15 reps ABduction: 15 reps   Modalities Modalities: Cryotherapy Manual Therapy Other Manual Therapy: PROM all directions 3x 30" w/gentle osscilations Cryotherapy Number Minutes Cryotherapy: 10 Minutes Cryotherapy Location: Shoulder Type of Cryotherapy: Ice pack  Physical Therapy Assessment and Plan PT Assessment and Plan Clinical Impression Statement: Weak RTC musculature noted with increased fatigue with reps, required more verbal/tactile cueing for correctly musculature.  PROM improving though limitied by pain, able to increase each rep. PT Plan: Continue progressing per protocol.    Goals    Problem List Patient Active Problem List  Diagnoses  . FIBROIDS, UTERUS  . DM  . HYPERLIPIDEMIA  . ANEMIA, IRON DEFICIENCY, MICROCYTIC  . GERD  . IMPINGEMENT SYNDROME  . RUPTURE ROTATOR CUFF  . Back pain  . Bursitis of shoulder, left  . Rotator cuff tear  . Biceps tendon rupture, proximal  . Rotator cuff  tear, left  . S/P shoulder surgery  . Shoulder pain  . Shoulder stiffness  . Muscle weakness (generalized)    PT - End of Session Activity Tolerance: Patient tolerated treatment well General Behavior During Session: [facility] for tasks performed Cognition: [facility] for tasks performed  GP No functional reporting required  Juel Burrow, PTA 12/01/2011, 3:21 PM

## 2011-12-03 ENCOUNTER — Ambulatory Visit (HOSPITAL_COMMUNITY)
Admission: RE | Admit: 2011-12-03 | Discharge: 2011-12-03 | Disposition: A | Discharge status: Home / Self Care | Payer: BC Managed Care – PPO | Source: Ambulatory Visit | Attending: Internal Medicine | Admitting: Internal Medicine

## 2011-12-03 NOTE — Progress Notes (Addendum)
Physical Therapy Treatment Patient Details  Name: CHATTIE GREESON MRN: [id number] Date of Birth: Oct 25, 1945  Today's Date: 12/03/2011 Time: 4034-7425 Time Calculation (min): 50 min Visit#: 7  of 12   Re-eval: 12/17/11 Assessment Diagnosis: Lt RTC repair Surgical Date: 10/29/11 Next MD Visit: 12/08/11 Charge: manual 20 min therex 20 min Ice 10 min  Subjective: Symptoms/Limitations Symptoms: No L UE pain at entrance. Pain Assessment Currently in Pain?: No/denies  Precautions/Restrictions  Precautions Precautions: Other (comment) Precaution Comments: Follow Dr. Romeo Apple RTC protocol. 6 weeks of sling use from date of surgery, 6 weeks PROM from date of surgery  Exercise/Treatments Supine External Rotation: PROM;Limitations External Rotation Limitations: 3x 30" with gentle osscilations Internal Rotation: PROM;Limitations Internal Rotation Limitations: 3x 30" with gentle osscilations Flexion: AAROM;10 reps;PROM;Limitations Flexion Limitations: 3x 30" with gentle osscilations ABduction: PROM;Limitations ABduction Limitations: 3x 30" with gentle osscilations Other Supine Exercises: scapular mobs Other Supine Exercises: RTC approximation 10x 5" fatigue at rep 7 Seated Retraction: 10 reps Retraction Limitations: scapula Therapy Ball Flexion: 15 reps ABduction: 15 reps Right/Left: Limitations Right/Left Limitations: 3 RT   Manual Therapy Manual Therapy: Joint mobilization Joint Mobilization: Grade I-III GH to decrease pain and improve shoulder flexion and ER Cryotherapy Number Minutes Cryotherapy: 10 Minutes Cryotherapy Location: Shoulder Type of Cryotherapy: Ice pack  Physical Therapy Assessment and Plan PT Assessment and Plan Clinical Impression Statement: Pt with improved muscule control RTC musculature, fatigue noted at rep 7.  ROM improved following scapular mobs to reduce pain, able to increase range each rep.   PT Plan: Progress shoulder POC per protocol, next  week is the 6th week.  Reassess next session prior MD apt on Wednesday,    Goals    Problem List Patient Active Problem List  Diagnoses  . FIBROIDS, UTERUS  . DM  . HYPERLIPIDEMIA  . ANEMIA, IRON DEFICIENCY, MICROCYTIC  . GERD  . IMPINGEMENT SYNDROME  . RUPTURE ROTATOR CUFF  . Back pain  . Bursitis of shoulder, left  . Rotator cuff tear  . Biceps tendon rupture, proximal  . Rotator cuff tear, left  . S/P shoulder surgery  . Shoulder pain  . Shoulder stiffness  . Muscle weakness (generalized)    PT - End of Session Activity Tolerance: Patient tolerated treatment well General Behavior During Session: [facility] for tasks performed Cognition: [facility] for tasks performed  GP No functional reporting required  Juel Burrow, PTA 12/03/2011, 3:17 PM

## 2011-12-06 ENCOUNTER — Ambulatory Visit (HOSPITAL_COMMUNITY)
Admission: RE | Admit: 2011-12-06 | Discharge: 2011-12-06 | Disposition: A | Discharge status: Home / Self Care | Payer: BC Managed Care – PPO | Source: Ambulatory Visit | Attending: Internal Medicine | Admitting: Internal Medicine

## 2011-12-06 NOTE — Evaluation (Signed)
Physical Therapy Evaluation  Patient Details  Name: Ann Vasquez MRN: [id number] Date of Birth: 02-Aug-1946  Today's Date: 12/06/2011 Time: 4098-1191 Time Calculation (min): 54 min Charges: 1 ROM, 1 MMT, 25' Manual, 15 TE  Visit#: 8  of 20   Re-eval: 01/05/12 Assessment Diagnosis: Lt RTC repair Surgical Date: 10/29/11 Next MD Visit: 12/08/11  Past Medical History:  Past Medical History  Diagnosis Date  . HTN (hypertension)   . High cholesterol   . Diabetes mellitus type 2, diet-controlled    Past Surgical History:  Past Surgical History  Procedure Date  . Right shoulder 2011    Romeo Apple, [facility]  . Shoulder surgery   . Shoulder open rotator cuff repair 10/29/2011    Procedure: ROTATOR CUFF REPAIR SHOULDER OPEN;  Surgeon: Fuller Canada, MD;  Location: [facility];  Service: Orthopedics;  Laterality: Left;    Subjective Symptoms/Limitations Symptoms: I am doing pretty well overall.  I just still using my sling when my arm is tired.  Pain Assessment Currently in Pain?: Yes Pain Score:   2 Pain Location: Shoulder Pain Orientation: Left  Cognition/Observation Observation/Other Assessments Observations: Wearing her sling in the community and in the house when it feels tired.   Assessment LUE AROM  Left Shoulder Flexion: 30 Degrees (semireclined position); ABduction: 42 Degrees (semireclined position); Internal Rotation: 30 Degrees (semireclined position (was 0);  External Rotation  0-90: 27 Degrees (semireclined position) LUE PROM  Left Shoulder Flexion: 125 Degrees (semireclined position (was 70);  ABduction: 90 Degrees (semireclined position (was 43); Internal Rotation: 55 Degrees (semireclined position (was 10)) External Rotation: 35 Degrees (semireclined position (was 10) LUE Strength Left Shoulder Flexion: 3-/5 (was 2/5); Extension: 3+/5 (was 3-/5); ABduction: 3-/5 (was 2/5); Internal Rotation: 3+/5 (was 2/5); External Rotation: 3+/5 (was 2/5) Left Elbow Flexion: 4/5  (was 4/5); Extension: 4/5 (was 4/5) Palpation Palpation: Increased pain and tenderness with fascial restrcitons to the deltoid tuberosity  Exercise/Treatments Supine External Rotation: AROM;20 reps Internal Rotation: AROM;20 reps Flexion: AROM;5 reps;Other (comment) (5 sec in available range) ABduction: AROM;5 reps (5 sec holds) Other Supine Exercises: Elbow flexion x20 Other Supine Exercises: RTC approximation 10x 10" Seated Retraction: 10 reps Retraction Limitations: 10 sec holds Therapy Ball Flexion: 20 reps ABduction: 20 reps Right/Left Limitations: 5 RT  Modalities Modalities: Cryotherapy Manual Therapy Manual Therapy: Joint mobilization Joint Mobilization: Grade I-III GH to decrease pain and improve shoulder flexion and ER  Other Manual Therapy: PROM all directions w/gentle osscilations  Cryotherapy Number Minutes Cryotherapy: 10 Minutes Cryotherapy Location: Shoulder  Physical Therapy Assessment and Plan PT Assessment and Plan Clinical Impression Statement: Ms. Northcraft has attended 8 OP PT visits over the past 4 weeks.  She has met 4/5 STG and is progressing toward her remaining goals.  She has made significant progress in her overall A/PROM, increased strength, improved RTC motor control and decreased muscle spasms.  She continues to be limited by her overall functional strength, AROM and mild pain w/fascial restrictions to her deltoid tuberosity.  She will continue to benefit from skilled OP PT in order to address remaining functional goals.   Rehab Potential: Good PT Frequency: Min 3X/week PT Duration: 8 weeks PT Treatment/Interventions: Functional mobility training;Therapeutic activities;Therapeutic exercise;Neuromuscular re-education;Patient/family education;Other (comment) (manual techniques, joint mobs, modalities PRN) PT Plan: Progress per shoulder protocol 4/12 will be 6 weeks.  F/U w/MD apt    Goals Home Exercise Program Pt will Perform Home Exercise Program:  Independently PT Goal: Perform Home Exercise Program - Progress: Met PT Short Term  Goals Time to Complete Short Term Goals: 4 weeks PT Short Term Goal 1: Pt will improve L shoulder PROM: flexion 0-100; abduction 0-90; ER: 0-25; IR: 0-45 PT Short Term Goal 1 - Progress: Met PT Short Term Goal 2: Pt will present with decreased fascial restrcitions. PT Short Term Goal 2 - Progress: Progressing toward goal PT Short Term Goal 3: Pt will report pain less than 5/10 for 50% of her day to decrease dependence on pain medication.  PT Short Term Goal 3 - Progress: Met PT Short Term Goal 4: Pt will improve her scapular and shoulder stabalizer endurance and demonstrate 10 shoulder approximations x10 sec without cueing.  PT Short Term Goal 4 - Progress: Met PT Long Term Goals Time to Complete Long Term Goals: 12 weeks PT Long Term Goal 1: Pt will improve shoulder AROM to WNL in order to complete necessary ADL's. PT Long Term Goal 1 - Progress: Progressing toward goal PT Long Term Goal 2: Pt will improve shoulder strength to WNL and demonstrate lifting a 20lb pan from counter to transfer to another counter to simulate return to work activities.  PT Long Term Goal 2 - Progress: Progressing toward goal Long Term Goal 3: Pt will improve her UEFS score to 60/80 for improved percieved functional ability.  Long Term Goal 3 Progress: Progressing toward goal  Problem List Patient Active Problem List  Diagnoses  . FIBROIDS, UTERUS  . DM  . HYPERLIPIDEMIA  . ANEMIA, IRON DEFICIENCY, MICROCYTIC  . GERD  . IMPINGEMENT SYNDROME  . RUPTURE ROTATOR CUFF  . Back pain  . Bursitis of shoulder, left  . Rotator cuff tear  . Biceps tendon rupture, proximal  . Rotator cuff tear, left  . S/P shoulder surgery  . Shoulder pain  . Shoulder stiffness  . Muscle weakness (generalized)    Cambell Rickenbach 12/06/2011, 3:43 PM

## 2011-12-08 ENCOUNTER — Encounter: Payer: Self-pay | Admitting: Orthopedic Surgery

## 2011-12-08 ENCOUNTER — Ambulatory Visit (INDEPENDENT_AMBULATORY_CARE_PROVIDER_SITE_OTHER): Payer: BC Managed Care – PPO | Admitting: Orthopedic Surgery

## 2011-12-08 ENCOUNTER — Ambulatory Visit (HOSPITAL_COMMUNITY)
Admission: RE | Admit: 2011-12-08 | Discharge: 2011-12-08 | Disposition: A | Discharge status: Home / Self Care | Payer: BC Managed Care – PPO | Source: Ambulatory Visit | Attending: Internal Medicine | Admitting: Internal Medicine

## 2011-12-08 VITALS — BP 142/70

## 2011-12-08 DIAGNOSIS — S43429A Sprain of unspecified rotator cuff capsule, initial encounter: Secondary | ICD-10-CM

## 2011-12-08 DIAGNOSIS — M75100 Unspecified rotator cuff tear or rupture of unspecified shoulder, not specified as traumatic: Secondary | ICD-10-CM

## 2011-12-08 DIAGNOSIS — Z9889 Other specified postprocedural states: Secondary | ICD-10-CM

## 2011-12-08 NOTE — Progress Notes (Signed)
Physical Therapy Treatment Patient Details  Name: Ann Vasquez MRN: [id number] Date of Birth: March 18, 1946  Today's Date: 12/08/2011 Time: 1300-1405 Time Calculation (min): 65 min Visit#: 9  of 20   Re-eval: 01/05/12 Diagnosis: Lt RTC repair Surgical Date: 10/29/11 Next MD Visit: 01/19/12 Charges: therex 36', manual 15', ice 10'  Subjective: Symptoms/Limitations Symptoms: Pt. states the doctor was pleased with her progress.  Pt. reports she is not hurting today. Pain Assessment Currently in Pain?: No/denies  Precautions/Restrictions  Precautions Precautions: Shoulder Type of Shoulder Precautions: Dr. Mort Sawyers Protocol  Exercise/Treatments Supine External Rotation: AAROM;PROM External Rotation Limitations: 2# dowel AA 10 reps and 3X30" PROM Internal Rotation: PROM Internal Rotation Limitations: 3x 30" with gentle osscilations Flexion: AAROM;10 reps;PROM Flexion Limitations: 2# dowel AAROM, 3X30" PROM ABduction: AAROM;10 reps;PROM ABduction Limitations: 2# dowel AAROM, 3X30" PROM Other Supine Exercises: Elbow flexion x20 Seated Retraction: 15 reps Retraction Limitations: 10 sec holds Standing Other Standing Exercises: add corner isometric with shoulders next visit Therapy Ball Flexion: 20 reps ABduction: 10 reps (pt had to stop due to pain) Right/Left Limitations: 5 RT   Modalities Modalities: Cryotherapy Manual Therapy Other Manual Therapy: PROM in all directions Cryotherapy Number Minutes Cryotherapy: 10 Minutes Cryotherapy Location: Shoulder Type of Cryotherapy: Ice pack  Physical Therapy Assessment and Plan PT Assessment and Plan Clinical Impression Statement: Pt. with pain in deltoid area with abduction and unable to perform all reps.  Performed AAROM with dowel in supine to get increased ROM.  Pt. will be S/P 6 weeks at the end of this week and is ready for progression into phase 2 of protocol. PT Plan: Progress to phase II of protocol.  Begin  scapular protraction/retraction against wall, thumbtacks, pulleys.     PT - End of Session Activity Tolerance: Patient tolerated treatment well General Behavior During Session: [facility] for tasks performed Cognition: [facility] for tasks performed   Trilby Leaver, SPTA/ Chazlyn Cude B. Bascom Levels, PTA 12/08/2011, 1:58 PM

## 2011-12-08 NOTE — Progress Notes (Signed)
Patient ID: Ann Vasquez, female   DOB: 1946-06-14, 66 y.o.   MRN: [id number] No chief complaint on file.  Chief Complaint  Patient presents with  . Procedure    3.1.13 LEFT RCR     Status post LEFT rotator cuff repair. No complaints.  Incision is clean.  Progressing well in therapy.  No refills needed.  Sling instructions were given. Followup in 6 weeks. Continue rehabilitation program per protocol

## 2011-12-10 ENCOUNTER — Ambulatory Visit (HOSPITAL_COMMUNITY)
Admission: RE | Admit: 2011-12-10 | Discharge: 2011-12-10 | Disposition: A | Discharge status: Home / Self Care | Payer: BC Managed Care – PPO | Source: Ambulatory Visit

## 2011-12-10 NOTE — Progress Notes (Signed)
Physical Therapy Treatment Patient Details  Name: Ann Vasquez MRN: [id number] Date of Birth: Sep 15, 1945  Today's Date: 12/10/2011 Time: 8469-6295 Time Calculation (min): 53 min Visit#: 10  of 20   Re-eval: 01/05/12  Charge: manual (PROM/scapular mobs) 15 min therex 28 min Ice 10 min  Subjective:  Pt stated shoulder feeling good today, no pain   Objective:   Exercise/Treatments Supine External Rotation: AAROM;PROM External Rotation Limitations: 2# dowel AA 10 reps and 3X30" PROM Internal Rotation: PROM Internal Rotation Limitations: 3x 30" with gentle osscilations Flexion: AAROM;10 reps;PROM Flexion Limitations: 2# dowel AAROM, 3X30" PROM ABduction: AAROM;10 reps;PROM ABduction Limitations: 2# dowel AAROM, 3X30" PROM Other Supine Exercises: Elbow flexion x20 Other Supine Exercises: scapular mobs Seated Retraction: 15 reps Retraction Limitations: 10 sec holds Standing Other Standing Exercises: 10x 10" Pulleys Flexion: Limitations Flexion Limitations: add next session ROM / Strengthening / Isometric Strengthening Thumb Tacks: 10 reps Prot/Ret//Elev/Dep: Protract/retract 10 reps       Physical Therapy Assessment and Plan PT Assessment and Plan Clinical Impression Statement: Progressed therex per phase 2 of protocol.  Pt followed demonstration and completed correctly without difficulty.  Pt stated no pain increase with new activity but can feel the muscules being used.  Pt with better RTC control presented today's session, less substitution for correct mm and does not fatigue as early.   PT Plan: Continue progressing in phase II of protocol, begin pulleys next session.    Goals    Problem List Patient Active Problem List  Diagnoses  . FIBROIDS, UTERUS  . DM  . HYPERLIPIDEMIA  . ANEMIA, IRON DEFICIENCY, MICROCYTIC  . GERD  . IMPINGEMENT SYNDROME  . RUPTURE ROTATOR CUFF  . Back pain  . Bursitis of shoulder, left  . Rotator cuff tear  . Biceps tendon  rupture, proximal  . Rotator cuff tear, left  . S/P shoulder surgery  . Shoulder pain  . Shoulder stiffness  . Muscle weakness (generalized)    PT - End of Session Activity Tolerance: Patient tolerated treatment well General Behavior During Session: [employer] for tasks performed Cognition: [facility] for tasks performed  GP No functional reporting required  Juel Burrow, PTA 12/10/2011, 7:20 PM

## 2011-12-13 ENCOUNTER — Ambulatory Visit (HOSPITAL_COMMUNITY)
Admission: RE | Admit: 2011-12-13 | Discharge: 2011-12-13 | Disposition: A | Discharge status: Home / Self Care | Payer: BC Managed Care – PPO | Source: Ambulatory Visit | Attending: Internal Medicine | Admitting: Internal Medicine

## 2011-12-13 NOTE — Progress Notes (Signed)
Physical Therapy Treatment Patient Details  Name: Ann Vasquez MRN: [id number] Date of Birth: 06/12/1946  Today's Date: 12/13/2011 Time: 1450-1530 Time Calculation (min): 40 min Charges: manual: 25', TE: 15 Visit#: 11  of 20   Re-eval: 01/05/12    Subjective: Symptoms/Limitations Symptoms: Pt reports that overall she is doing pretty well.  Pain Assessment Currently in Pain?: Yes Pain Score:   2 Pain Location: Shoulder Pain Orientation: Left  Exercise/Treatments Supine External Rotation: AROM;20 reps ("Open/Close Coat") Internal Rotation: AROM;20 reps Flexion: AROM;20 reps Flexion Limitations: 1# dowel rod Prone  External Rotation: 10 reps (POE) Other Prone Exercises: POE weight shifting x10 Standing Other Standing Exercises: Extension, Grn tband x10; Rows Grn tband x10 ROM / Strengthening / Isometric Strengthening Wall Wash: 2x30 sec Prot/Ret//Elev/Dep: all 4 5 reps   Manual Therapy Joint Mobilization: Grade II to GH to improve flexion and ER Other Manual Therapy: PROM all directions  Physical Therapy Assessment and Plan PT Assessment and Plan Clinical Impression Statement: She tolerated all new endurance exercise without an increase in pain. Overall continues to be limited by her functional AROM and endurance.  PT Plan: Cont to progress to phase II    Goals    Problem List Patient Active Problem List  Diagnoses  . FIBROIDS, UTERUS  . DM  . HYPERLIPIDEMIA  . ANEMIA, IRON DEFICIENCY, MICROCYTIC  . GERD  . IMPINGEMENT SYNDROME  . RUPTURE ROTATOR CUFF  . Back pain  . Bursitis of shoulder, left  . Rotator cuff tear  . Biceps tendon rupture, proximal  . Rotator cuff tear, left  . S/P shoulder surgery  . Shoulder pain  . Shoulder stiffness  . Muscle weakness (generalized)       GP No functional reporting required  Chirstina Haan 12/13/2011, 4:18 PM

## 2011-12-15 ENCOUNTER — Ambulatory Visit (HOSPITAL_COMMUNITY)
Admission: RE | Admit: 2011-12-15 | Discharge: 2011-12-15 | Disposition: A | Discharge status: Home / Self Care | Payer: BC Managed Care – PPO | Source: Ambulatory Visit | Attending: Internal Medicine | Admitting: Internal Medicine

## 2011-12-15 NOTE — Progress Notes (Signed)
Physical Therapy Treatment Patient Details  Name: Ann Vasquez MRN: [id number] Date of Birth: Sep 17, 1945  Today's Date: 12/15/2011 Time: 7829-5621 Time Calculation (min): 38 min Visit#: 12  of 20   Re-eval: 01/05/12 Charges:  therex 38'    Subjective: Symptoms/Limitations Symptoms: Pt. reports she is not having any pain today. Pain Assessment Currently in Pain?: No/denies  Exercises Instructed by Trilby Leaver, SPTA under the direction of Carianna Lague Bascom Levels, PTA/CI. Exercise/Treatments Supine External Rotation: AROM;20 reps Internal Rotation: AROM;20 reps Flexion: AROM;20 reps Flexion Limitations: 1# dowel rod Prone  External Rotation: 10 reps Other Prone Exercises: POE weight shifting x10 Standing Other Standing Exercises: Extension, Rows blue band 10 reps each ROM / Strengthening / Isometric Strengthening Wall Wash: 2x30 sec Prot/Ret//Elev/Dep: all 4 exercises 10 reps    Manual Therapy Other Manual Therapy: PROM all directions  Physical Therapy Assessment and Plan PT Assessment and Plan Clinical Impression Statement: Abduction continues to be most limited.  Pt. able to progress to blue tband today without difficulty.  PT Plan: continue to progress per protocol.     PT - End of Session Activity Tolerance: Patient tolerated treatment well General Behavior During Session: [facility] for tasks performed Cognition: [facility] for tasks performed    Trilby Leaver, SPTA/ Amberley Hamler B. Bascom Levels, PTA 12/15/2011, 4:05 PM

## 2011-12-17 ENCOUNTER — Ambulatory Visit (HOSPITAL_COMMUNITY)
Admission: RE | Admit: 2011-12-17 | Discharge: 2011-12-17 | Disposition: A | Discharge status: Home / Self Care | Payer: BC Managed Care – PPO | Source: Ambulatory Visit | Attending: Physical Therapy | Admitting: Physical Therapy

## 2011-12-17 NOTE — Progress Notes (Signed)
Physical Therapy Treatment Patient Details  Name: Ann Vasquez MRN: [id number] Date of Birth: 01/16/1946  Today's Date: 12/17/2011 Time: 4098-1191 Time Calculation (min): 41 min Charges: TE: 15', Manual: 8', NMR: 18' Visit#: 13  of 20   Re-eval: 01/05/12    Subjective: Symptoms/Limitations Symptoms: She reports she is doing pretty well overall.  Greatest problem is tenderness in her muscle Pain Assessment Currently in Pain?: No/denies  Exercise/Treatments  Supine Flexion: AROM;15 reps;Other (comment);Left (manual resistance into extension) Seated Flexion: 10 reps;Other (comment) (w/TC's to improve scapular retraction) Prone  Extension: 10 reps;Limitations (10 sec hold) Extension Limitations: NMR to estabilish scapular rhythm Other Prone Exercises: Rows w/TC's and VC's for scapular control 5x10 sec holds Sidelying External Rotation: Left;10 reps;Limitations External Rotation Limitations: TC's for scapular control ABduction: Other (comment) (6 reps secondary to pain) Standing Other Standing Exercises: Elbow presses into corner 10x10 sec ROM / Strengthening / Isometric Strengthening UBE (Upper Arm Bike): 3 min forward/3 min backward 1.0 Wall Wash: 1' Thumb Tacks: 1' Prot/Ret//Elev/Dep: all 4 exercises 20 reps    Manual Therapy Manual Therapy: Myofascial release Joint Mobilization: Grade II-III to improve flexion and ER Myofascial Release: To biceps muscle belly Other Manual Therapy: PROM in all directions  Physical Therapy Assessment and Plan PT Assessment and Plan Clinical Impression Statement: Today's treatment focus was to improve motor control scapular muscles w/use of multimodal cueing and improve shoulder endurance. Continues to improve her overall functional strength and AROM in supine.  Continues to need cueing for proper scapular and shoulder rhythm PT Plan: cont to progress UE endurance    Goals    Problem List Patient Active Problem List    Diagnoses  . FIBROIDS, UTERUS  . DM  . HYPERLIPIDEMIA  . ANEMIA, IRON DEFICIENCY, MICROCYTIC  . GERD  . IMPINGEMENT SYNDROME  . RUPTURE ROTATOR CUFF  . Back pain  . Bursitis of shoulder, left  . Rotator cuff tear  . Biceps tendon rupture, proximal  . Rotator cuff tear, left  . S/P shoulder surgery  . Shoulder pain  . Shoulder stiffness  . Muscle weakness (generalized)   Ann Vasquez 12/17/2011, 1:51 PM

## 2011-12-20 ENCOUNTER — Ambulatory Visit (HOSPITAL_COMMUNITY)
Admission: RE | Admit: 2011-12-20 | Discharge: 2011-12-20 | Disposition: A | Discharge status: Home / Self Care | Payer: BC Managed Care – PPO | Source: Ambulatory Visit | Attending: Internal Medicine | Admitting: Internal Medicine

## 2011-12-20 NOTE — Progress Notes (Signed)
Physical Therapy Treatment Patient Details  Name: Ann Vasquez MRN: [id number] Date of Birth: 18-Mar-1946  Today's Date: 12/20/2011 Time: 4098-1191 Time Calculation (min): 36 min Visit#: 14  of 20   Re-eval: 01/05/12 Charges: Therex x 33'  Subjective: Symptoms/Limitations Symptoms: Pt states that she is not having any pain, just discomfort. Pain Assessment Currently in Pain?: No/denies  Exercises Instructed by Trilby Leaver, SPTA under the direction of Seth Bake PTA. Exercise/Treatments Supine Flexion: AROM;15 reps;Other (comment);Left (manual resistance into extension) Prone  Extension: 10 reps Other Prone Exercises: Rows w/TC's and VC's for scapular control 5x10 sec holds Sidelying External Rotation: Left;10 reps;Limitations ABduction: 10 reps;Limitations ABduction Limitations: With assistance with add secondary to pain with eccentic mm contraction Standing Other Standing Exercises: Elbow presses into corner 10x10 sec ROM / Strengthening / Isometric Strengthening Wall Wash: 1' Thumb Tacks: 1' Prot/Ret//Elev/Dep: all 4 exercises 20 reps   Manual Therapy Manual Therapy: Other (comment) Other Manual Therapy: PROM in all directions  Physical Therapy Assessment and Plan PT Assessment and Plan Clinical Impression Statement: Pt completes all therex well with minimal need for cueing. Pt displays greatest restriction in abduction. Pt is without complaint throughout session. Pt reports decreased discomfort at end of session. PT Plan: Continue to progress per PT POC.     Problem List Patient Active Problem List  Diagnoses  . FIBROIDS, UTERUS  . DM  . HYPERLIPIDEMIA  . ANEMIA, IRON DEFICIENCY, MICROCYTIC  . GERD  . IMPINGEMENT SYNDROME  . RUPTURE ROTATOR CUFF  . Back pain  . Bursitis of shoulder, left  . Rotator cuff tear  . Biceps tendon rupture, proximal  . Rotator cuff tear, left  . S/P shoulder surgery  . Shoulder pain  . Shoulder stiffness  .  Muscle weakness (generalized)    PT - End of Session Activity Tolerance: Patient tolerated treatment well General Behavior During Session: [facility] for tasks performed Cognition: [facility] for tasks performed  Seth Bake, PTA 12/20/2011, 10:13 AM

## 2011-12-22 ENCOUNTER — Emergency Department (HOSPITAL_COMMUNITY): Payer: BC Managed Care – PPO

## 2011-12-22 ENCOUNTER — Encounter (HOSPITAL_COMMUNITY): Payer: Self-pay | Admitting: Emergency Medicine

## 2011-12-22 ENCOUNTER — Emergency Department (HOSPITAL_COMMUNITY)
Admission: EM | Admit: 2011-12-22 | Discharge: 2011-12-22 | Disposition: A | Discharge status: Home / Self Care | Payer: BC Managed Care – PPO | Attending: Emergency Medicine | Admitting: Emergency Medicine

## 2011-12-22 ENCOUNTER — Telehealth (HOSPITAL_COMMUNITY): Payer: Self-pay

## 2011-12-22 ENCOUNTER — Ambulatory Visit (HOSPITAL_COMMUNITY): Payer: BC Managed Care – PPO | Admitting: *Deleted

## 2011-12-22 DIAGNOSIS — I10 Essential (primary) hypertension: Secondary | ICD-10-CM | POA: Insufficient documentation

## 2011-12-22 DIAGNOSIS — R51 Headache: Secondary | ICD-10-CM | POA: Insufficient documentation

## 2011-12-22 DIAGNOSIS — Z79899 Other long term (current) drug therapy: Secondary | ICD-10-CM | POA: Insufficient documentation

## 2011-12-22 DIAGNOSIS — M62838 Other muscle spasm: Secondary | ICD-10-CM | POA: Insufficient documentation

## 2011-12-22 DIAGNOSIS — R202 Paresthesia of skin: Secondary | ICD-10-CM

## 2011-12-22 DIAGNOSIS — M542 Cervicalgia: Secondary | ICD-10-CM | POA: Insufficient documentation

## 2011-12-22 DIAGNOSIS — G8918 Other acute postprocedural pain: Secondary | ICD-10-CM | POA: Insufficient documentation

## 2011-12-22 DIAGNOSIS — M25519 Pain in unspecified shoulder: Secondary | ICD-10-CM | POA: Insufficient documentation

## 2011-12-22 DIAGNOSIS — R209 Unspecified disturbances of skin sensation: Secondary | ICD-10-CM | POA: Insufficient documentation

## 2011-12-22 DIAGNOSIS — E7800 Pure hypercholesterolemia, unspecified: Secondary | ICD-10-CM | POA: Insufficient documentation

## 2011-12-22 DIAGNOSIS — E119 Type 2 diabetes mellitus without complications: Secondary | ICD-10-CM | POA: Insufficient documentation

## 2011-12-22 DIAGNOSIS — M25512 Pain in left shoulder: Secondary | ICD-10-CM

## 2011-12-22 LAB — BASIC METABOLIC PANEL
Calcium: 10.4 mg/dL (ref 8.4–10.5)
Chloride: 105 mEq/L (ref 96–112)
Creatinine, Ser: 0.61 mg/dL (ref 0.50–1.10)
GFR calc Af Amer: >90 mL/min (ref >90)
GFR calc non Af Amer: >90 mL/min (ref >90)

## 2011-12-22 LAB — CBC
MCHC: 30.9 g/dL (ref 30.0–36.0)
Platelets: 227 10*3/uL (ref 150–400)
RDW: 14.3 % (ref 11.5–15.5)
WBC: 5.2 10*3/uL (ref 4.0–10.5)

## 2011-12-22 MED ORDER — OXYCODONE-ACETAMINOPHEN 5-325 MG PO TABS
1.0000 | ORAL_TABLET | Freq: Once | ORAL | Status: AC
  Administered 2011-12-22: 1 via ORAL
  Filled 2011-12-22: qty 1

## 2011-12-22 MED ORDER — DIAZEPAM 5 MG PO TABS
5.0000 mg | ORAL_TABLET | Freq: Once | ORAL | Status: AC
  Administered 2011-12-22: 5 mg via ORAL
  Filled 2011-12-22: qty 1

## 2011-12-22 NOTE — ED Provider Notes (Signed)
History   This chart was scribed for Laray Anger, DO by Clarita Crane. The patient was seen in room APA10/AP[facility]10. Patient's care was started at 0921.    CSN: [id number]  Arrival date & time 12/22/11  4098   First MD Initiated Contact with Patient 12/22/11 [phone]      Chief Complaint  Patient presents with  . Numbness  . Headache     HPI Pt was seen at 9:57 AM Ann Vasquez is a 66 y.o. female seen at 9:57AM who presents to the Emergency Department complaining of gradual onset and persistence of constant left sided neck "pain" radiating to top of her head since last night at approximately 2300.  Patient also c/o gradual onset and persistence of constant entire left sided face "numbness" that she noticed this morning upon awaking.   Denies focal motor weakness, no tingling/numbness in extremities, no N/V/D, no CP/SOB, no abd pain, no fevers, no rash, no headache, no visual changes, no facial droop, no slurred speech.  Patient is currently in PT for s/p surgical repair of left rotator cuff performed by Dr. Romeo Apple.    Past Medical History  Diagnosis Date  . HTN (hypertension)   . High cholesterol   . Diabetes mellitus type 2, diet-controlled   . DJD (degenerative joint disease) of cervical spine     Past Surgical History  Procedure Date  . Right shoulder 2011    rotator cuff repair, Romeo Apple, [facility]  . Shoulder open rotator cuff repair 10/29/2011    Procedure: left ROTATOR CUFF REPAIR SHOULDER OPEN;  Surgeon: Fuller Canada, MD;  Location: [facility];  Service: Orthopedics;  Laterality: Left;    Family History  Problem Relation Age of Onset  . Anesthesia problems Neg Hx   . Malignant hyperthermia Neg Hx   . Hypotension Neg Hx   . Pseudochol deficiency Neg Hx     History  Substance Use Topics  . Smoking status: Never Smoker   . Smokeless tobacco: Not on file  . Alcohol Use: No    Review of Systems ROS: Statement: All systems negative except as marked or noted in  the HPI; Constitutional: Negative for fever and chills. ; ; Eyes: Negative for eye pain, redness and discharge. ; ; ENMT: Negative for ear pain, hoarseness, nasal congestion, sinus pressure and sore throat. ; ; Cardiovascular: Negative for chest pain, palpitations, diaphoresis, dyspnea and peripheral edema. ; ; Respiratory: Negative for cough, wheezing and stridor. ; ; Gastrointestinal: Negative for nausea, vomiting, diarrhea, abdominal pain, blood in stool, hematemesis, jaundice and rectal bleeding. . ; ; Genitourinary: Negative for dysuria, flank pain and hematuria. ; ; Musculoskeletal: Negative for back pain. Negative for swelling and trauma.; ; Skin: Negative for pruritus, rash, abrasions, blisters, bruising and skin lesion.; ; Neuro: +paresthesias, left sided neck/post head pain. Negative for headache, lightheadedness and neck stiffness. Negative for weakness, altered level of consciousness , altered mental status, extremity weakness, involuntary movement, seizure and syncope.     Allergies  Review of patient's allergies indicates no known allergies.  Home Medications   Current Outpatient Rx  Name Route Sig Dispense Refill  . HYDROCODONE-ACETAMINOPHEN 5-325 MG PO TABS Oral Take 1 tablet by mouth every 6 (six) hours as needed. Pain    . LISINOPRIL 20 MG PO TABS Oral Take 20 mg by mouth daily.    Marland Kitchen ROSUVASTATIN CALCIUM 10 MG PO TABS Oral Take 10 mg by mouth at bedtime.       BP 140/64  Pulse  66  Temp(Src) 97.6 F (36.4 C) (Oral)  Resp 20  Ht 4\' 11"  (1.499 m)  Wt 138 lb (62.596 kg)  BMI 27.87 kg/m2  SpO2 97%  Physical Exam Exam performed at 10:00AM Physical examination:  Nursing notes reviewed; Vital signs and O2 SAT reviewed;  Constitutional: Well developed, Well nourished, Well hydrated, In no acute distress; Head:  Normocephalic, atraumatic; Eyes: EOMI, PERRL, No scleral icterus; ENMT: Mouth and pharynx normal, Mucous membranes moist; Neck: Supple, Full range of motion, No  lymphadenopathy; Cardiovascular: Regular rate and rhythm, No murmur, rub, or gallop; Respiratory: Breath sounds clear & equal bilaterally, No rales, rhonchi, wheezes, or rub, Normal respiratory effort/excursion; Chest: Nontender, Movement normal; Abdomen: Soft, Nontender, Nondistended, Normal bowel sounds; Extremities: Pulses normal, +mild TTP with decreased ROM flexion and ABD left shoulder s/p rotator cuff repair with well healing surgical scar, no erythema/edema/ecchymosis, No edema, No calf edema or asymmetry.; Spine:  No midline CS, TS, LS tenderness.  +TTP hypertonic trapezius and cervical paraspinal muscles.;  Neuro: AA&Ox3, Major CN grossly intact. Major CN grossly intact.  Strength 5/5 equal bilat UE's and LE's.  DTR 2/4 equal bilat UE's and LE's.  +subjective decreased sensation to entire left side of face including forehead, no change in previous decreased sensation LUE s/p rotator cuff repair per pt, otherwise no gross sensory deficits.  Normal cerebellar testing bilat UE's and LE's. Speech clear.  No facial droop.; Skin: Color normal, Warm, Dry, no rash.    ED Course  Procedures   MDM  MDM Reviewed: nursing note, previous chart and vitals Reviewed previous: MRI and ECG Interpretation: MRI, x-ray, labs and ECG    Date: 12/22/2011  Rate: 62  Rhythm: normal sinus rhythm and sinus arrhythmia  QRS Axis: normal  Intervals: normal  ST/T Wave abnormalities: normal  Conduction Disutrbances:none  Narrative Interpretation:   Old EKG Reviewed: unchanged; no significant changes from previous EKG dated 10/27/2011 which demonstrated NSR.  Results for orders placed during the hospital encounter of 12/22/11  BASIC METABOLIC PANEL      Component Value Range   Sodium 139  135 - 145 (mEq/L)   Potassium 4.2  3.5 - 5.1 (mEq/L)   Chloride 105  96 - 112 (mEq/L)   CO2 24  19 - 32 (mEq/L)   Glucose, Bld 142 (*) 70 - 99 (mg/dL)   BUN 10  6 - 23 (mg/dL)   Creatinine, Ser 1.61  0.50 - 1.10 (mg/dL)    Calcium 09.6  8.4 - 10.5 (mg/dL)   GFR calc non Af Amer >90  >90 (mL/min)   GFR calc Af Amer >90  >90 (mL/min)  CBC      Component Value Range   WBC 5.2  4.0 - 10.5 (K/uL)   RBC 5.10  3.87 - 5.11 (MIL/uL)   Hemoglobin 11.5 (*) 12.0 - 15.0 (g/dL)   HCT 04.5  40.9 - 81.1 (%)   MCV 72.9 (*) 78.0 - 100.0 (fL)   MCH 22.5 (*) 26.0 - 34.0 (pg)   MCHC 30.9  30.0 - 36.0 (g/dL)   RDW 91.4  78.2 - 95.6 (%)   Platelets 227  150 - 400 (K/uL)   Dg Chest 2 View 12/22/2011  *RADIOLOGY REPORT*  Clinical Data: Headache.  Left sided numbness.  CHEST - 2 VIEW  Comparison: 07/01/2006  Findings: Heart size is normal.  Mediastinal shadows are normal. There is chronic pulmonary scarring, most notable in the lingula. No evidence of active infiltrate, mass, effusion or collapse.  No significant  bony finding.  IMPRESSION: No active disease.  Pulmonary scarring most notable in the lingula.  Original Report Authenticated By: Thomasenia Sales, M.D.   Mr Brain Wo Contrast 12/22/2011  *RADIOLOGY REPORT*  Clinical Data: New onset left-sided facial numbness.  Left-sided body numbness and weakness. Hypertension.  Diabetes mellitus type 2.  MRI HEAD WITHOUT CONTRAST  Technique:  Multiplanar, multiecho pulse sequences of the brain and surrounding structures were obtained according to standard protocol without intravenous contrast.  Comparison: CT head 05/25/2010  Findings: There is no evidence for acute infarction, intracranial hemorrhage, mass lesion, hydrocephalus, or extra-axial fluid. There is no significant atrophy.  Mild increased signal in the periventricular and subcortical white matter suggests chronic microvascular ischemic change.  There is a remote roughly 1 cm chronic lacunar infarct in the right parieto-occipital periventricular white matter.    The relationship of this right parietal lacunar infarct to the patient's left body sensory symptoms is unclear, with the lesion has clearly been present since September 2011.  There  are no foci of chronic hemorrhage. The major intracranial vessels structures are patent.  The pituitary, pineal, and cerebellar tonsils are normal.  Calvarium is intact.  There is no significant sinus or mastoid disease.  Negative orbits.  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  Other scattered white matter hypodensities suggesting chronic microvascular ischemic change can be related to hypertension or diabetes.  No acute stroke or cortical infarction.  No areas of acute or chronic hemorrhage.  No acute or focal intracranial abnormality is detected which might explain the patient's left facial and body numbness.  Original Report Authenticated By: Elsie Stain, M.D.     1:36 PM:  Pt's facial numbness continues unchanged, no improvement or progression/worsening of neuro symptoms.  MRI negative for acute stroke.  Left neck pain improved after meds.  VSS, resps easy.  Wants to go home now.  Dx testing d/w pt and family.  Questions answered.  Verb understanding, agreeable to d/c home with outpt f/u.           I personally performed the services described in this documentation, which was scribed in my presence. The recorded information has been reviewed and considered. Abcde Oneil Allison Quarry, DO 12/24/11 2229

## 2011-12-22 NOTE — ED Notes (Signed)
Pt DC to home with [facility] and family

## 2011-12-22 NOTE — ED Notes (Signed)
Pt c/o left face numbness and headache since last night. Pt states she woke with the numbness this am. Last known well time is 11pm last night.

## 2011-12-22 NOTE — Discharge Instructions (Signed)
RESOURCE GUIDE  Dental Problems  Patients with Medicaid: [facility]                     [facility] [address]                                           [address] Phone:  [phone]                                                  Phone:  [phone]  If unable to pay or uninsured, contact:  [facility] or [facility] to become qualified for the adult dental clinic.  Chronic Pain Problems Contact [facility]  [phone] Patients need to be referred by their primary care doctor.  Insufficient Money for Medicine Contact [employer]:  call "211" or [facility] 271-5999.  No Primary Care Doctor Call [facility]  [phone] Other agencies that provide inexpensive medical care    [facility]  [phone]    [facility]  [phone]    [facility]  [phone]    Women's Clinic  [phone]    [facility]  [phone]    [facility]  [phone]  Psychological Services [facility]  [phone] [facility]  [phone] [facility]   [phone] (emergency services [phone])  Substance Abuse Resources [facility]  336-882-2125 [facility] 336-784-9470 [facility] 336-285-9073 [facility] 336-845-3988 Residential & Outpatient Substance Abuse Program  800-659-3381  Abuse/Neglect [place] Child Abuse Hotline (336) 641-3795 [place] Child Abuse Hotline 800-378-5315 (After Hours)  Emergency Shelter [facility] (336) 271-5985  Maternity Homes [facility] (336) 275-9566 [facility] (704) 372-4663  MRSA Hotline #:   832-7006    [place] Resources  [facility] [place]     [employer]                          [facility] [address] [place]                       [address]      [address]  [place]                                                [place]                            [place] Phone:  [phone]                                   Phone:  [phone]                 Phone:  [phone]  [facility] Phone:  [phone]    [facility] Child Abuse Hotline (854)699-4188 (934)160-3592 (After Hours)   Take your usual pain and muscle spasm medications as previously directed.  Apply moist heat or ice to the area(s) of discomfort, for 15 minutes at a time, several times per day for the next few days.  Do not fall asleep on a heating or ice pack.  Call your regular medical doctor today to schedule a follow up appointment this week.  Return to the Emergency Department immediately if worsening.

## 2011-12-24 ENCOUNTER — Ambulatory Visit (HOSPITAL_COMMUNITY)
Admission: RE | Admit: 2011-12-24 | Discharge: 2011-12-24 | Disposition: A | Discharge status: Home / Self Care | Payer: BC Managed Care – PPO | Source: Ambulatory Visit

## 2011-12-24 NOTE — Progress Notes (Signed)
Physical Therapy Treatment Patient Details  Name: Ann Vasquez MRN: [id number] Date of Birth: 08-28-1946  Today's Date: 12/24/2011 Time: 4098-1191 Time Calculation (min): 71 min Visit#: 15  of 20   Re-eval: 01/05/12 Assessment Diagnosis: Lt RTC repair Surgical Date: 10/29/11 Next MD Visit: 01/19/12 Charge: therex 34 min Manual 23 min Ice 10 min  Subjective: Symptoms/Limitations Symptoms: No real pain just sore today, went to ER Wednesday thought I was having s/s of a stroke had muscle spasms radiating up my neck on L side. Pain Assessment Currently in Pain?: No/denies  Objective:   Exercise/Treatments Supine Flexion: AROM;15 reps;Other (comment);Left Flexion Limitations: manual resistance with tricep activation extending Other Supine Exercises: scapular mobs  Prone  Extension: 10 reps Other Prone Exercises: Rows w/TC's and VC's for scapular control 10 x10 sec holds Other Prone Exercises: POE protract/retract for serratus anterior activation10x 5" Sidelying External Rotation: Left;10 reps;Limitations External Rotation Limitations: TC's for scapular control ABduction: 10 reps;Limitations ABduction Limitations: With assistance with add secondary to pain with eccentic mm contraction Standing Other Standing Exercises: Elbow presses into corner 10x10 sec ROM / Strengthening / Isometric Strengthening  Wall Wash: 1'  Thumb Tacks: 1'  Prot/Ret//Elev/Dep: all 4 exercises 20 reps   Manual Therapy Manual Therapy: Massage Massage: STM to L cervical and UT to reduce spasms x 10 min Other Manual Therapy: PROM in supine all directions 3x 30" holds  Physical Therapy Assessment and Plan PT Assessment and Plan Clinical Impression Statement: Added prone protract/retraction for serratus anterior activation with multimodal cueing for proper technique, pt able to complete with good form following cues.  Pt able to complete all therex with min cueing for correct scapular  musculature activation, pt stated no pain just discomfort.  STM complete to L cervical/scapular region to reduce spasms with relief stated by pt following.   PT Plan: Continue to progress per PT POC.    Goals    Problem List Patient Active Problem List  Diagnoses  . FIBROIDS, UTERUS  . DM  . HYPERLIPIDEMIA  . ANEMIA, IRON DEFICIENCY, MICROCYTIC  . GERD  . IMPINGEMENT SYNDROME  . RUPTURE ROTATOR CUFF  . Back pain  . Bursitis of shoulder, left  . Rotator cuff tear  . Biceps tendon rupture, proximal  . Rotator cuff tear, left  . S/P shoulder surgery  . Shoulder pain  . Shoulder stiffness  . Muscle weakness (generalized)    PT - End of Session Activity Tolerance: Patient tolerated treatment well General Behavior During Session: [facility] for tasks performed Cognition: [facility] for tasks performed  GP No functional reporting required  Juel Burrow, PTA 12/24/2011, 10:44 AM

## 2011-12-27 ENCOUNTER — Ambulatory Visit (HOSPITAL_COMMUNITY)
Admission: RE | Admit: 2011-12-27 | Discharge: 2011-12-27 | Disposition: A | Discharge status: Home / Self Care | Payer: BC Managed Care – PPO | Source: Ambulatory Visit | Attending: Internal Medicine | Admitting: Internal Medicine

## 2011-12-27 NOTE — Progress Notes (Signed)
Physical Therapy Treatment Patient Details  Name: Ann Vasquez MRN: [id number] Date of Birth: [birth year]  Today's Date: 12/27/2011 Time: 9528-4132 Time Calculation (min): 42 min Visit#: 16  of 20   Re-eval: 01/05/12 Charges:  therex 24', manual 15'    Subjective: Symptoms/Limitations Symptoms: Pt. states the massage helped last session; reports  she hasn't had the spasms as bad since the massage; reports 2/10 pain today. Pain Assessment Currently in Pain?: Yes Pain Score:   2 Pain Location: Neck Pain Orientation: Left   Exercise/Treatments Supine Other Supine Exercises: PROM 3 X 30" Prone  Extension: 15 reps Other Prone Exercises: Rows w/TC's and VC's for scapular control 15 x10 sec holds Other Prone Exercises: POE protract/retract for serratus anterior activation 15x 5" Sidelying External Rotation: 15 reps ABduction: 15 reps ABduction Limitations: With assistance with add secondary to pain with eccentic mm contraction Standing Other Standing Exercises: Elbow presses into corner 15x10 sec ROM / Strengthening / Isometric Strengthening UBE (Upper Arm Bike): 4' backwards @ 1.0 Wall Wash: 1' Thumb Tacks: 1' Prot/Ret//Elev/Dep: 10 reps    Manual Therapy Manual Therapy: Massage Massage: STM to L cervical and UT to reduce spasms x 10 min Other Manual Therapy: PROM in supine all directions 3x 30" holds   Physical Therapy Assessment and Plan PT Assessment and Plan Clinical Impression Statement: Pt. able to increase reps of exercises without difficulty.  Large spasm in L upper trap resolved with STM.  Pt. continues to have painful endfeel with all motions. PT Plan: Progress per POC.     Problem List Patient Active Problem List  Diagnoses  . FIBROIDS, UTERUS  . DM  . HYPERLIPIDEMIA  . ANEMIA, IRON DEFICIENCY, MICROCYTIC  . GERD  . IMPINGEMENT SYNDROME  . RUPTURE ROTATOR CUFF  . Back pain  . Bursitis of shoulder, left  . Rotator cuff tear  . Biceps tendon  rupture, proximal  . Rotator cuff tear, left  . S/P shoulder surgery  . Shoulder pain  . Shoulder stiffness  . Muscle weakness (generalized)    PT - End of Session Activity Tolerance: Patient tolerated treatment well General Behavior During Session: [facility] for tasks performed Cognition: [facility] for tasks performed   Kelsha Older B. Bascom Levels, PTA 12/27/2011, 11:40 AM

## 2011-12-29 ENCOUNTER — Ambulatory Visit (HOSPITAL_COMMUNITY)
Admission: RE | Admit: 2011-12-29 | Discharge: 2011-12-29 | Disposition: A | Discharge status: Home / Self Care | Payer: BC Managed Care – PPO | Source: Ambulatory Visit | Attending: Internal Medicine | Admitting: Internal Medicine

## 2011-12-29 DIAGNOSIS — E119 Type 2 diabetes mellitus without complications: Secondary | ICD-10-CM | POA: Insufficient documentation

## 2011-12-29 DIAGNOSIS — E785 Hyperlipidemia, unspecified: Secondary | ICD-10-CM | POA: Insufficient documentation

## 2011-12-29 DIAGNOSIS — IMO0001 Reserved for inherently not codable concepts without codable children: Secondary | ICD-10-CM | POA: Insufficient documentation

## 2011-12-29 DIAGNOSIS — M25619 Stiffness of unspecified shoulder, not elsewhere classified: Secondary | ICD-10-CM | POA: Insufficient documentation

## 2011-12-29 DIAGNOSIS — M6281 Muscle weakness (generalized): Secondary | ICD-10-CM | POA: Insufficient documentation

## 2011-12-29 DIAGNOSIS — M25519 Pain in unspecified shoulder: Secondary | ICD-10-CM | POA: Insufficient documentation

## 2011-12-29 NOTE — Progress Notes (Signed)
Physical Therapy Treatment Patient Details  Name: Ann Vasquez MRN: [id number] Date of Birth: November 25, 1945  Today's Date: 12/29/2011 Time: 3086-5784 PT Time Calculation (min): 39 min Charges: 25' Manual, 14' Te Visit#: 17  of 20   Re-eval: 01/05/12    Authorization:    Authorization Time Period:    Authorization Visit#:   of     Subjective: Symptoms/Limitations Symptoms: Reports that her muscle stays sore (UT).  She is still having difficulty fixing her hair and overhead activities.  Pain Assessment Currently in Pain?: Yes Pain Score:   2  Precautions/Restrictions     Exercise/Treatments Supine External Rotation: AROM;20 reps;Weights External Rotation Weight (lbs): 1 Flexion: Both;Weights;AAROM;20 reps Shoulder Flexion Weight (lbs): 3 (dowel ) ABduction: AROM;10 reps (NMR to decrease UT activation) Prone  Other Prone Exercises: Rows w/TC's and VC's for scapular control 10 x5 sec holds Other Prone Exercises: POE protract/retract for serratus anterior activation 15x 5"   Manual Therapy Manual Therapy: Myofascial release Myofascial Release: To L UT and medial border of scapula w/manual STM afterwards to decrease fascial restrcitions and improve ROM  Physical Therapy Assessment and Plan PT Assessment and Plan Clinical Impression Statement: Treatment focus was to decrease fascial restrictions/muscles spasms and improve scapular rythm.  Pt able to tolerate increased weights with supine activities and demonstrates improve scapular rythm after treatment w/decreased UT activiation. Reports decreased soreness after treatment PT Plan: Cont to progress and decrease pain and improve scapular ryythm.     Goals    Problem List Patient Active Problem List  Diagnoses  . FIBROIDS, UTERUS  . DM  . HYPERLIPIDEMIA  . ANEMIA, IRON DEFICIENCY, MICROCYTIC  . GERD  . IMPINGEMENT SYNDROME  . RUPTURE ROTATOR CUFF  . Back pain  . Bursitis of shoulder, left  . Rotator cuff tear    . Biceps tendon rupture, proximal  . Rotator cuff tear, left  . S/P shoulder surgery  . Shoulder pain  . Shoulder stiffness  . Muscle weakness (generalized)    PT - End of Session Activity Tolerance: Patient tolerated treatment well  GP No functional reporting required  Kayleb Warshaw 12/29/2011, 10:18 AM

## 2011-12-30 ENCOUNTER — Ambulatory Visit (HOSPITAL_COMMUNITY): Payer: BC Managed Care – PPO | Admitting: *Deleted

## 2012-01-03 ENCOUNTER — Ambulatory Visit (HOSPITAL_COMMUNITY)
Admission: RE | Admit: 2012-01-03 | Discharge: 2012-01-03 | Disposition: A | Discharge status: Home / Self Care | Payer: BC Managed Care – PPO | Source: Ambulatory Visit | Attending: *Deleted | Admitting: *Deleted

## 2012-01-03 NOTE — Progress Notes (Signed)
Physical Therapy Treatment Patient Details  Name: Ann Vasquez MRN: [id number] Date of Birth: Jun 04, 1946  Today's Date: 01/03/2012 Time: 4098-1191 PT Time Calculation (min): 38 min Visit#: 18  of 20   Re-eval: 01/05/12 Charges: Therex x 24' Manual x 8'   Subjective: Symptoms/Limitations Symptoms: Pt reports no pain just a little discomfort in her shoulder. Pain Assessment Currently in Pain?: No/denies Pain Score: 0-No pain   Exercise/Treatments Supine External Rotation: 10 reps (10 reps only secondary to time) External Rotation Weight (lbs): 1 Flexion: Both;Weights;AAROM;10 reps (10 reps only secondary to time) Shoulder Flexion Weight (lbs): 3 (dowel) ABduction: AROM;10 reps Prone  Other Prone Exercises: Rows w/TC's and VC's for scapular control 15 x5 sec holds Other Prone Exercises: POE protract/retract for serratus anterior activation 15x 5" Standing Other Standing Exercises: Elbow presses into corner 15x10 sec ROM / Strengthening / Isometric Strengthening UBE (Upper Arm Bike): 4' backwards @ 1.0 Thumb Tacks: 1'     Manual Therapy Manual Therapy: Myofascial release Massage: STM to L cervical and UT to reduce spasms with PROM Other Manual Therapy: PROM in supine all directions  Physical Therapy Assessment and Plan PT Assessment and Plan Clinical Impression Statement: Pt appears to achieve more range with AROM rather than PROM. Pt requires multimodal cueing to facilitate correct mm activation with prone retraction. Pt reports no increase in pain at end of session. PT Plan: Continue to progress per PT POC.     Problem List Patient Active Problem List  Diagnoses  . FIBROIDS, UTERUS  . DM  . HYPERLIPIDEMIA  . ANEMIA, IRON DEFICIENCY, MICROCYTIC  . GERD  . IMPINGEMENT SYNDROME  . RUPTURE ROTATOR CUFF  . Back pain  . Bursitis of shoulder, left  . Rotator cuff tear  . Biceps tendon rupture, proximal  . Rotator cuff tear, left  . S/P shoulder surgery  .  Shoulder pain  . Shoulder stiffness  . Muscle weakness (generalized)    PT - End of Session Activity Tolerance: Patient tolerated treatment well General Behavior During Session: [facility] for tasks performed Cognition: [facility] for tasks performed   Seth Bake, PTA 01/03/2012, 11:59 AM

## 2012-01-05 ENCOUNTER — Ambulatory Visit (HOSPITAL_COMMUNITY)
Admission: RE | Admit: 2012-01-05 | Discharge: 2012-01-05 | Disposition: A | Discharge status: Home / Self Care | Payer: BC Managed Care – PPO | Source: Ambulatory Visit | Attending: Internal Medicine | Admitting: Internal Medicine

## 2012-01-05 NOTE — Progress Notes (Signed)
Physical Therapy Evaluation  Patient Details  Name: Ann Vasquez MRN: [id number] Date of Birth: 1946/07/05  Today's Date: 01/05/2012 Time: 4098-1191 PT Time Calculation (min): 57 min Charges: Manual: 30', TE: 24', 1 ROM, 1 MMT Visit#: 19  of 32   Re-eval: 02/04/12    Past Medical History:  Past Medical History  Diagnosis Date  . HTN (hypertension)   . High cholesterol   . Diabetes mellitus type 2, diet-controlled   . DJD (degenerative joint disease) of cervical spine    Past Surgical History:  Past Surgical History  Procedure Date  . Right shoulder 2011    rotator cuff repair, Romeo Apple, [facility]  . Shoulder open rotator cuff repair 10/29/2011    Procedure: left ROTATOR CUFF REPAIR SHOULDER OPEN;  Surgeon: Fuller Canada, MD;  Location: [facility];  Service: Orthopedics;  Laterality: Left;    Subjective Symptoms/Limitations Symptoms: Pt reports she is still having difficutly lifting her arm up in the air.  She states that yesterday she had some shooting pain into her R deltoid into her bicep.  Pain Assessment Currently in Pain?: No/denies (Reports stiffness)  Assessment LUE AROM (degrees) LUE Overall AROM Comments: All measurments taken in the standing position Left Shoulder Extension  0-60: 48 Degrees Left Shoulder Flexion  0-170: 125 Degrees Left Shoulder ABduction 0-170: 60 Degrees Left Shoulder Internal Rotation  0-90: 70 Degrees Left Shoulder External Rotation  0-90: 55 Degrees LUE Strength Left Shoulder Flexion: 3+/5 Left Shoulder Extension: 4/5 Left Shoulder ABduction: 3+/5 Left Shoulder Internal Rotation: 5/5 Left Shoulder External Rotation: 4/5 Left Elbow Flexion: 4/5 Left Elbow Extension: 4/5 Palpation Palpation: Moderate fascial restrictions throughout Shoulder and L UE  Exercise/Treatments  Seated External Rotation: 20 reps;Weights External Rotation Weight (lbs): 1 Flexion: 20 reps;Other (comment) (qucik) Abduction: 10 reps;Other (comment) (within  available range) Other Seated Exercises: Shoulder Rolls backwardsx20 Standing External Rotation: 10 reps;Theraband;Limitations Theraband Level (Shoulder External Rotation): Level 3 (Green) External Rotation Limitations: Cueing for proper position Internal Rotation: 10 reps;Theraband Theraband Level (Shoulder Internal Rotation): Level 3 (Green) Other Standing Exercises: Shoulder extension Grn T-band x15; Rows Grn T-band x15 ROM / Strengthening / Isometric Strengthening UBE (Upper Arm Bike): L UE only 1:30 forward, 1:30 back Wall Wash: 1' w/1lb wrist weight Thumb Tacks: 2' Prot/Ret//Elev/Dep: 1'    Manual Therapy Manual Therapy: Myofascial release Joint Mobilization: Grade III L GH mobs to improve shoulder ER and flexion Myofascial Release: to reduce fascial restrcitions to L shoulder, bicep, tricep and scapular region w/pt in supine Other Manual Therapy: PROM after MFR and Joint mobs in all directions, great focus on ER and IR  Physical Therapy Assessment and Plan PT Assessment and Plan Clinical Impression Statement: Re-assessment complete today.  Ms. Greenhouse has attended 66 OP PT visits and has significant improvement with her AROM and strength, however continues to have the greatest limitations with functional strength, ROM and pain.  Pt will benefit from skilled therapeutic intervention in order to improve on the following deficits: Pain;Impaired UE functional use;Decreased range of motion;Decreased mobility;Decreased strength PT Frequency: Min 3X/week PT Duration: 4 weeks PT Treatment/Interventions: Therapeutic activities;Therapeutic exercise;Neuromuscular re-education;Patient/family education;Other (comment) (Manual techniques and modalities for pain and ROM) PT Plan: Cont to progress functional strength and ROM.     Goals Home Exercise Program Pt will Perform Home Exercise Program: Independently PT Short Term Goals Time to Complete Short Term Goals: 4 weeks PT Short Term Goal  1: Pt will improve L shoulder PROM: flexion 0-100; abduction 0-90; ER: 0-25; IR: 0-45 PT  Short Term Goal 1 - Progress: Met PT Short Term Goal 2: Pt will present with decreased fascial restrcitions. PT Short Term Goal 2 - Progress: Met PT Short Term Goal 3: Pt will report pain less than 5/10 for 50% of her day to decrease dependence on pain medication.  PT Short Term Goal 3 - Progress: Met PT Short Term Goal 4: Pt will improve her scapular and shoulder stabalizer endurance and demonstrate 10 shoulder approximations x10 sec without cueing.  PT Short Term Goal 4 - Progress: Met PT Long Term Goals Time to Complete Long Term Goals: 12 weeks PT Long Term Goal 1: Pt will improve shoulder AROM to WNL in order to complete necessary ADL's. PT Long Term Goal 1 - Progress: Progressing toward goal PT Long Term Goal 2: Pt will improve shoulder strength to WNL and demonstrate lifting a 20lb pan from counter to transfer to another counter to simulate return to work activities.  PT Long Term Goal 2 - Progress: Not met Long Term Goal 3: Pt will improve her UEFS score to 60/80 for improved percieved functional ability.  Long Term Goal 3 Progress: Progressing toward goal  Problem List Patient Active Problem List  Diagnoses  . FIBROIDS, UTERUS  . DM  . HYPERLIPIDEMIA  . ANEMIA, IRON DEFICIENCY, MICROCYTIC  . GERD  . IMPINGEMENT SYNDROME  . RUPTURE ROTATOR CUFF  . Back pain  . Bursitis of shoulder, left  . Rotator cuff tear  . Biceps tendon rupture, proximal  . Rotator cuff tear, left  . S/P shoulder surgery  . Shoulder pain  . Shoulder stiffness  . Muscle weakness (generalized)    PT - End of Session Activity Tolerance: Patient tolerated treatment well  Mercy Leppla 01/05/2012, 10:42 AM

## 2012-01-06 ENCOUNTER — Ambulatory Visit (HOSPITAL_COMMUNITY)
Admission: RE | Admit: 2012-01-06 | Discharge: 2012-01-06 | Disposition: A | Discharge status: Home / Self Care | Payer: BC Managed Care – PPO | Source: Ambulatory Visit | Attending: Internal Medicine | Admitting: Internal Medicine

## 2012-01-06 NOTE — Progress Notes (Signed)
Physical Therapy Treatment Patient Details  Name: AMILLYA CHAVIRA MRN: [id number] Date of Birth: [birth year]  Today's Date: 01/06/2012 Time: 4782-9562 PT Time Calculation (min): 51 min Visit#: 20  of 32   Re-eval: 02/04/12 Charges:  therex 31',  Manual 15'    Subjective: Symptoms/Limitations Symptoms: Pt. states she's having a little pain in her shoulder today.  Rates 1/10. Pain Assessment Currently in Pain?: Yes Pain Score:   1 Pain Location: Shoulder Pain Orientation: Left   Exercise/Treatments Supine Flexion: Both;Weights;AAROM;10 reps Shoulder Flexion Weight (lbs): 3 Other Supine Exercises: star gazers 10X Other Supine Exercises: PROM 3 X 30" Seated External Rotation: 20 reps External Rotation Weight (lbs): 1 Flexion: 20 reps Abduction: 20 reps Other Seated Exercises: Shoulder Rolls backwardsx20 Standing External Rotation: 10 reps;Theraband;Limitations Theraband Level (Shoulder External Rotation): Level 3 (Green) External Rotation Limitations: Cueing for proper position Internal Rotation: 10 reps;Theraband Theraband Level (Shoulder Internal Rotation): Level 3 (Green) Other Standing Exercises: tband rows, ext. 10 reps green ROM / Strengthening / Isometric Strengthening UBE (Upper Arm Bike): 4' L UE only 2'fwd/2'bkwd Wall Wash: 1' w/1lb wrist weight Thumb Tacks: 2'    Manual Therapy Manual Therapy: Other (comment) Myofascial Release: to reduce fascial restrcitions to L shoulder, bicep, tricep and scapular region w/pt in supine  Other Manual Therapy: PROM after MFR all directions.  Physical Therapy Assessment and Plan PT Assessment and Plan Clinical Impression Statement: Added counter to cabinet lift today to begin return to functional activities.  Pt. able to reach 1st shelf with activity using 1# weight.   Good results with PROM following MFR.   PT Plan: Continue to progress.     Problem List Patient Active Problem List  Diagnoses  . FIBROIDS, UTERUS    . DM  . HYPERLIPIDEMIA  . ANEMIA, IRON DEFICIENCY, MICROCYTIC  . GERD  . IMPINGEMENT SYNDROME  . RUPTURE ROTATOR CUFF  . Back pain  . Bursitis of shoulder, left  . Rotator cuff tear  . Biceps tendon rupture, proximal  . Rotator cuff tear, left  . S/P shoulder surgery  . Shoulder pain  . Shoulder stiffness  . Muscle weakness (generalized)    PT - End of Session Activity Tolerance: Patient tolerated treatment well General Behavior During Session: [facility] for tasks performed Cognition: [facility] for tasks performed   Jon Kasparek B. Bascom Levels, PTA 01/06/2012, 10:46 AM

## 2012-01-10 ENCOUNTER — Ambulatory Visit (HOSPITAL_COMMUNITY)
Admission: RE | Admit: 2012-01-10 | Discharge: 2012-01-10 | Disposition: A | Discharge status: Home / Self Care | Payer: BC Managed Care – PPO | Source: Ambulatory Visit | Attending: Internal Medicine | Admitting: Internal Medicine

## 2012-01-10 NOTE — Progress Notes (Signed)
Physical Therapy Treatment Patient Details  Name: Ann Vasquez MRN: [id number] Date of Birth: 1946-05-15  Today's Date: 01/10/2012 Time: 4098-1191 PT Time Calculation (min): 43 min Visit#: 21  of 32   Re-eval: 02/04/12 Charges: Therex x 30' Manual x 8'  Subjective: Symptoms/Limitations Symptoms: It's just a little stiff today. No pain. Pain Assessment Currently in Pain?: No/denies Pain Score: 0-No pain   Exercise/Treatments Supine Flexion: Both;Weights;AAROM;10 reps Shoulder Flexion Weight (lbs): 3 Other Supine Exercises: star gazers 10X Other Supine Exercises: PROM 3 X 30" Seated External Rotation: 20 reps External Rotation Weight (lbs): 1 Flexion: 20 reps Abduction: 20 reps (with multimodal cueing to avoid UT compensation) Other Seated Exercises: Shoulder Rolls backwardsx20 Standing External Rotation: 10 reps;Theraband;Limitations Theraband Level (Shoulder External Rotation): Level 3 (Green) Internal Rotation: 10 reps;Theraband Theraband Level (Shoulder Internal Rotation): Level 3 (Green) Other Standing Exercises: tband rows, ext. 10 reps green ROM / Strengthening / Isometric Strengthening UBE (Upper Arm Bike): 4' L UE only 2'fwd/2'bkwd Wall Wash: 2' w/1lb wrist weight Thumb Tacks: 1.5' unable to complete 2' secondary to pain/fatigue  Physical Therapy Assessment and Plan PT Assessment and Plan Clinical Impression Statement: Pt completes IR/ER exercises with good form and minimal need for cuing. Pt requires multimodal cueing to avoid UT compensation with active abd and flex. Pt appears to achieve greater ROM for flexion with dowel rod than with PROM. Pt requests to stop ROM before end feel is felt with all directions. Pt reports no increase in pain at end of session. PT Plan: Continue to progress strength and ROM per PT POC.     Problem List Patient Active Problem List  Diagnoses  . FIBROIDS, UTERUS  . DM  . HYPERLIPIDEMIA  . ANEMIA, IRON DEFICIENCY,  MICROCYTIC  . GERD  . IMPINGEMENT SYNDROME  . RUPTURE ROTATOR CUFF  . Back pain  . Bursitis of shoulder, left  . Rotator cuff tear  . Biceps tendon rupture, proximal  . Rotator cuff tear, left  . S/P shoulder surgery  . Shoulder pain  . Shoulder stiffness  . Muscle weakness (generalized)    PT - End of Session Activity Tolerance: Patient tolerated treatment well General Behavior During Session: [facility] for tasks performed Cognition: [facility] for tasks performed  Seth Bake, PTA 01/10/2012, 10:24 AM

## 2012-01-12 ENCOUNTER — Ambulatory Visit (HOSPITAL_COMMUNITY)
Admission: RE | Admit: 2012-01-12 | Discharge: 2012-01-12 | Disposition: A | Discharge status: Home / Self Care | Payer: BC Managed Care – PPO | Source: Ambulatory Visit | Attending: Internal Medicine | Admitting: Internal Medicine

## 2012-01-12 NOTE — Progress Notes (Signed)
Physical Therapy Treatment Patient Details  Name: Ann Vasquez MRN: [id number] Date of Birth: May 31, 1946  Today's Date: 01/12/2012 Time: 4098-1191 PT Time Calculation (min): 42 min Visit#: 22  of 32   Re-eval: 02/04/12 Charges: Therex x 34'  Subjective: Symptoms/Limitations Symptoms: No pain. It's still stiff. Pain Assessment Currently in Pain?: No/denies Pain Score: 0-No pain   Exercise/Treatments Supine Other Supine Exercises: PROM 3 X 30" Seated Flexion: 20 reps Abduction: 20 reps (with multimodal cueing to avoid UT compensation) Standing External Rotation: 15 reps Theraband Level (Shoulder External Rotation): Level 3 (Green) Internal Rotation: 15 reps Theraband Level (Shoulder Internal Rotation): Level 3 (Green) Other Standing Exercises: tband rows, ext. 15 reps green ROM / Strengthening / Isometric Strengthening UBE (Upper Arm Bike): 6' L UE only 3'fwd/3'bkwd Wall Wash: 2' w/1lb wrist weight Thumb Tacks: 2'  Physical Therapy Assessment and Plan PT Assessment and Plan Clinical Impression Statement: Pt continues to require multimodal cueing to avoid UT compensation with abduction and flexion. Pt presents with decreased guarding with PROM this session. Pt reports decreased stiffness and no increase in pain at end of session. PT Plan: Continue to progress per PT POC.     Problem List Patient Active Problem List  Diagnoses  . FIBROIDS, UTERUS  . DM  . HYPERLIPIDEMIA  . ANEMIA, IRON DEFICIENCY, MICROCYTIC  . GERD  . IMPINGEMENT SYNDROME  . RUPTURE ROTATOR CUFF  . Back pain  . Bursitis of shoulder, left  . Rotator cuff tear  . Biceps tendon rupture, proximal  . Rotator cuff tear, left  . S/P shoulder surgery  . Shoulder pain  . Shoulder stiffness  . Muscle weakness (generalized)    PT - End of Session Activity Tolerance: Patient tolerated treatment well General Behavior During Session: [facility] for tasks performed Cognition: [facility] for tasks  performed   Seth Bake, PTA 01/12/2012, 10:50 AM

## 2012-01-14 ENCOUNTER — Ambulatory Visit (HOSPITAL_COMMUNITY): Payer: BC Managed Care – PPO

## 2012-01-17 ENCOUNTER — Ambulatory Visit (HOSPITAL_COMMUNITY)
Admission: RE | Admit: 2012-01-17 | Discharge: 2012-01-17 | Disposition: A | Discharge status: Home / Self Care | Payer: BC Managed Care – PPO | Source: Ambulatory Visit | Attending: Internal Medicine | Admitting: Internal Medicine

## 2012-01-17 NOTE — Progress Notes (Signed)
Physical Therapy Treatment Patient Details  Name: Ann Vasquez MRN: [id number] Date of Birth: [birth year]  Today's Date: 01/17/2012 Time: 4098-1191 PT Time Calculation (min): 43 min Charges: 30' TE, 13' Manual Visit#: 23  of 32   Re-eval: 02/04/12   Subjective: Symptoms/Limitations Symptoms: I am doing ok today.  I don't feel like I am doing enough with this shoulder and my elbow and wrist are stiff too.  Pain Assessment Currently in Pain?: Yes Pain Score:   1 Pain Location: Shoulder Pain Orientation: Left Pain Type: Surgical pain;Acute pain  Exercise/Treatments Seated Flexion: 20 reps (Quick) Abduction: 20 reps (quick) Other Seated Exercises: Shoulder Roll backs x20 Other Seated Exercises: Shoulder Circles w/shoulder abduction x20 both directions ROM / Strengthening / Isometric Strengthening UBE (Upper Arm Bike): 6' B UE only 3'fwd/3'bkwd 3.0 Thumb Tacks: 2' Over Head Lace: 6:30 2x10 links up and down   Manual Therapy Manual Therapy: Joint mobilization Joint Mobilization: Grade III L GH mobs to improve shoulder ER and flexion w/PROM to ER after.  Myofascial Release: to reduce fascial restrcitions to L shoulder, bicep, tricep and scapular region w/pt in semi-recline with feet elevated  Physical Therapy Assessment and Plan PT Assessment and Plan Clinical Impression Statement: Treatment focus on endurance to her shoulder today.  Continues to improve overall endurance with decreased compensations.  Has greatest ROM limitation with ER PT Plan: Re-eval next visit for MD apt (pt told me at end of session.)    Goals    Problem List Patient Active Problem List  Diagnoses  . FIBROIDS, UTERUS  . DM  . HYPERLIPIDEMIA  . ANEMIA, IRON DEFICIENCY, MICROCYTIC  . GERD  . IMPINGEMENT SYNDROME  . RUPTURE ROTATOR CUFF  . Back pain  . Bursitis of shoulder, left  . Rotator cuff tear  . Biceps tendon rupture, proximal  . Rotator cuff tear, left  . S/P shoulder surgery  .  Shoulder pain  . Shoulder stiffness  . Muscle weakness (generalized)    PT - End of Session Activity Tolerance: Patient tolerated treatment well General Behavior During Session: [facility] for tasks performed Cognition: [facility] for tasks performed  GP No functional reporting required  Keria Widrig 01/17/2012, 11:00 AM

## 2012-01-19 ENCOUNTER — Ambulatory Visit (HOSPITAL_COMMUNITY)
Admission: RE | Admit: 2012-01-19 | Discharge: 2012-01-19 | Disposition: A | Discharge status: Home / Self Care | Payer: BC Managed Care – PPO | Source: Ambulatory Visit | Attending: Internal Medicine | Admitting: Internal Medicine

## 2012-01-19 ENCOUNTER — Encounter: Payer: Self-pay | Admitting: Orthopedic Surgery

## 2012-01-19 ENCOUNTER — Ambulatory Visit (INDEPENDENT_AMBULATORY_CARE_PROVIDER_SITE_OTHER): Payer: BC Managed Care – PPO | Admitting: Orthopedic Surgery

## 2012-01-19 VITALS — BP 130/60 | Ht 59.0 in | Wt 138.0 lb

## 2012-01-19 DIAGNOSIS — Z9889 Other specified postprocedural states: Secondary | ICD-10-CM

## 2012-01-19 NOTE — Patient Instructions (Addendum)
Continue therapy

## 2012-01-19 NOTE — Evaluation (Signed)
Physical Therapy Re-Evaluation  Patient Details  Name: Ann Vasquez MRN: [id number] Date of Birth: 1946-02-24  Today's Date: 01/19/2012 Time: 1015-1058 PT Time Calculation (min): 43 min Charges: 1 ROM, 1 MMT, 38' Manual Visit#: 24  of 32   Re-eval: 02/18/12 Assessment Diagnosis: Lt RTC repair Surgical Date: 10/29/11 Next MD Visit: Dr. Romeo Apple 01/19/12   Past Medical History:  Past Medical History  Diagnosis Date  . HTN (hypertension)   . High cholesterol   . Diabetes mellitus type 2, diet-controlled   . DJD (degenerative joint disease) of cervical spine    Past Surgical History:  Past Surgical History  Procedure Date  . Right shoulder 2011    rotator cuff repair, Romeo Apple, [facility]  . Shoulder open rotator cuff repair 10/29/2011    Procedure: left ROTATOR CUFF REPAIR SHOULDER OPEN;  Surgeon: Fuller Canada, MD;  Location: [facility];  Service: Orthopedics;  Laterality: Left;    Subjective Symptoms/Limitations Symptoms: Pt reports that she was able to sew and make a cake last night.  her shoulder is a little tired, but overall it feels good.  reports decreased stiffness.  Pain Assessment Currently in Pain?: Yes Pain Score:   1 (Stiffness) Pain Location: Shoulder Pain Orientation: Left  Assessment LUE AROM (degrees) LUE Overall AROM Comments: All measurments taken in seated position except IR and Er Left Shoulder Extension  0-60: 65 Degrees (was 48) Left Shoulder Flexion  0-170: 138 Degrees (was 125) Left Shoulder ABduction 0-170: 75 Degrees (was 60) Left Shoulder Internal Rotation  0-90: 80 Degrees (was 70) Left Shoulder External Rotation  0-90: 35 Degrees (in supine (was 55 in seated)) LUE PROM (degrees) LUE Overall PROM Comments: Measurments taken in Supine position Left Shoulder Flexion  0-170: 145 Degrees (was 125) Left Shoulder ABduction 0-170: 100 Degrees (was 90) Left Shoulder Internal Rotation  0-90: 80 Degrees (was 55) Left Shoulder External Rotation  0-90:  43 Degrees (w/shoulder adduction (was 35)) LUE Strength Left Shoulder Flexion: 3+/5 (was 3+/5) Left Shoulder Extension: 5/5 (was 4/5) Left Shoulder ABduction: 3+/5 (was 3+/5) Left Shoulder Internal Rotation: 5/5 (was 5/5) Left Shoulder External Rotation: 5/5 (was 4/5) Palpation Palpation: Mild fascial restrcitons to RTC insertion.   Exercise/Treatments  UBE x6 minutes 3.0  Manual Therapy Manual Therapy: Myofascial release Joint Mobilization: Grade II-IV to L GH joint mobs to increase flexion and ER Myofascial Release: to decrease fascial restrcitions to RTC insertion  Other Manual Therapy: PROM to all direction  Physical Therapy Assessment and Plan PT Assessment and Plan Clinical Impression Statement: [name] has attended 24 OP PT visits and has met 5 STG and progressing towards her LTG.  Her greatest limitation is decreased strength and muscular endurance.  Overall she has improved with reports of pain on average of 1-2/10 and is slowly returning to her functional activities.  Pt will benefit from skilled therapeutic intervention in order to improve on the following deficits: Decreased range of motion;Decreased strength;Pain;Impaired UE functional use;Increased fascial restricitons Rehab Potential: Good PT Frequency: Min 3X/week PT Duration: 4 weeks PT Treatment/Interventions: Therapeutic activities;Therapeutic exercise;Neuromuscular re-education;Other (comment) (Manual techniques and modalities for pain) PT Plan: Cont to progress and focus on functional activities.  Counter to cabinet lifts, turning steering wheel,    Goals Home Exercise Program Pt will Perform Home Exercise Program: Independently PT Goal: Perform Home Exercise Program - Progress: Met PT Short Term Goals Time to Complete Short Term Goals: 4 weeks PT Short Term Goal 1: Pt will improve L shoulder PROM: flexion 0-100; abduction  0-90; ER: 0-25; IR: 0-45 PT Short Term Goal 1 - Progress: Met PT Short Term Goal  2: Pt will present with decreased fascial restrcitions. PT Short Term Goal 2 - Progress: Met PT Short Term Goal 3: Pt will report pain less than 5/10 for 50% of her day to decrease dependence on pain medication.  PT Short Term Goal 3 - Progress: Met PT Short Term Goal 4: Pt will improve her scapular and shoulder stabalizer endurance and demonstrate 10 shoulder approximations x10 sec without cueing.  PT Short Term Goal 4 - Progress: Met PT Long Term Goals Time to Complete Long Term Goals: 12 weeks PT Long Term Goal 1: Pt will improve shoulder AROM to WNL in order to complete necessary ADL's. PT Long Term Goal 1 - Progress: Progressing toward goal PT Long Term Goal 2: Pt will improve shoulder strength to WNL and demonstrate lifting a 20lb pan from counter to transfer to another counter to simulate return to work activities.  PT Long Term Goal 2 - Progress: Not met Long Term Goal 3: Pt will improve her UEFS score to 60/80 for improved percieved functional ability.  Long Term Goal 3 Progress: Progressing toward goal (51/80)  Problem List Patient Active Problem List  Diagnoses  . FIBROIDS, UTERUS  . DM  . HYPERLIPIDEMIA  . ANEMIA, IRON DEFICIENCY, MICROCYTIC  . GERD  . IMPINGEMENT SYNDROME  . RUPTURE ROTATOR CUFF  . Back pain  . Bursitis of shoulder, left  . Rotator cuff tear  . Biceps tendon rupture, proximal  . Rotator cuff tear, left  . S/P shoulder surgery  . Shoulder pain  . Shoulder stiffness  . Muscle weakness (generalized)    PT - End of Session Activity Tolerance: Patient tolerated treatment well General Behavior During Session: [facility] for tasks performed Cognition: [facility] for tasks performed  [name] 01/19/2012, 11:06 AM  Physician Documentation Your signature is required to indicate approval of the treatment plan as stated above.  Please sign and either send electronically or make a copy of this report for your files and return this physician signed original.     Please mark one 1.__approve of plan  2. ___approve of plan with the following conditions.   ______________________________                                                          _____________________ Physician Signature                                                                                                             Date

## 2012-01-19 NOTE — Progress Notes (Signed)
Patient ID: Ann Vasquez, female   DOB: June 07, 1946, 66 y.o.   MRN: [id number] Chief Complaint  Patient presents with  . Follow-up    follow up left shoulder following PT, DOS 10/29/11   Rotator cuff repair   Doing well   PT notes review  FE 150 PROM  ER 45

## 2012-01-26 ENCOUNTER — Ambulatory Visit (HOSPITAL_COMMUNITY)
Admission: RE | Admit: 2012-01-26 | Discharge: 2012-01-26 | Disposition: A | Discharge status: Home / Self Care | Payer: BC Managed Care – PPO | Source: Ambulatory Visit | Attending: Internal Medicine | Admitting: Internal Medicine

## 2012-01-26 NOTE — Progress Notes (Signed)
Physical Therapy Treatment Patient Details  Name: Ann Vasquez MRN: [id number] Date of Birth: 1946/08/11  Today's Date: 01/26/2012 Time: 7829-5621 PT Time Calculation (min): 45 min  Visit#: 25  of 32   Re-eval: 02/18/12 Charges: Therex x 28' Manual x 10' .  Subjective: Symptoms/Limitations Symptoms: The stiffness has gotten better. Pain Assessment Currently in Pain?: No/denies   Exercise/Treatments Seated Flexion: 20 reps (quick) Abduction: 20 reps (quick) Other Seated Exercises: Shoulder Circles w/shoulder abduction x20 both directions Standing Other Standing Exercises: Cabinet lifts flex x10 w/1# abd x 10 no wt  Other Standing Exercises: steering wheel simulation against wall x 2' ROM / Strengthening / Isometric Strengthening UBE (Upper Arm Bike): 6' B UE only 3'fwd/3'bkwd 3.0 Wall Wash: 2' w/1lb wrist weight Thumb Tacks: 1'30" Over Head Lace: 5:50 2x10 links up and down    Manual Therapy Manual Therapy: Joint mobilization Joint Mobilization: Grade II-III to L GH joint mobs to increase flexion and abd Other Manual Therapy: PROM to all direction   Physical Therapy Assessment and Plan PT Assessment and Plan Clinical Impression Statement: Pt continues to progress well. Began steering wheel simulation against wall and cabinet lifts to improve functional strength. Pt continues to guard with PROM. Pt reports no increase in pain at end of session. PT Plan: Continue to progress per PT POC.     Problem List Patient Active Problem List  Diagnoses  . FIBROIDS, UTERUS  . DM  . HYPERLIPIDEMIA  . ANEMIA, IRON DEFICIENCY, MICROCYTIC  . GERD  . IMPINGEMENT SYNDROME  . RUPTURE ROTATOR CUFF  . Back pain  . Bursitis of shoulder, left  . Rotator cuff tear  . Biceps tendon rupture, proximal  . Rotator cuff tear, left  . S/P shoulder surgery  . Shoulder pain  . Shoulder stiffness  . Muscle weakness (generalized)    PT - End of Session Activity Tolerance: Patient  tolerated treatment well General Behavior During Session: [facility] for tasks performed Cognition: [facility] for tasks performed  Seth Bake, PTA 01/26/2012, 3:21 PM

## 2012-01-31 ENCOUNTER — Ambulatory Visit (HOSPITAL_COMMUNITY)
Admission: RE | Admit: 2012-01-31 | Discharge: 2012-01-31 | Disposition: A | Discharge status: Home / Self Care | Payer: BC Managed Care – PPO | Source: Ambulatory Visit | Attending: Internal Medicine | Admitting: Internal Medicine

## 2012-01-31 DIAGNOSIS — IMO0001 Reserved for inherently not codable concepts without codable children: Secondary | ICD-10-CM | POA: Insufficient documentation

## 2012-01-31 DIAGNOSIS — E785 Hyperlipidemia, unspecified: Secondary | ICD-10-CM | POA: Insufficient documentation

## 2012-01-31 DIAGNOSIS — M6281 Muscle weakness (generalized): Secondary | ICD-10-CM | POA: Insufficient documentation

## 2012-01-31 DIAGNOSIS — E119 Type 2 diabetes mellitus without complications: Secondary | ICD-10-CM | POA: Insufficient documentation

## 2012-01-31 DIAGNOSIS — M25619 Stiffness of unspecified shoulder, not elsewhere classified: Secondary | ICD-10-CM | POA: Insufficient documentation

## 2012-01-31 DIAGNOSIS — M25519 Pain in unspecified shoulder: Secondary | ICD-10-CM | POA: Insufficient documentation

## 2012-01-31 NOTE — Progress Notes (Signed)
Physical Therapy Treatment Patient Details  Name: Ann Vasquez MRN: [id number] Date of Birth: [birth year]  Today's Date: 01/31/2012 Time: 1110-1155 PT Time Calculation (min): 45 min Visit#: 26  of 32   Re-eval: 02/18/12 Charges: Therex x 29' Manual x 10'    Subjective: Symptoms/Limitations Symptoms: pt states she had a lot of mm pain over the weekend. She denies completing any activity that may have caused this. No pain currently. Pain Assessment Currently in Pain?: No/denies   Exercise/Treatments Supine Other Supine Exercises: PROM all directions Seated Flexion: 20 reps;Limitations Flexion Limitations: quick Abduction: 20 reps;Limitations ABduction Limitations: quick Other Seated Exercises: Shoulder Circles w/shoulder abduction x20 both directions Standing Other Standing Exercises: Cabinet lifts flex x10 w/1# abd x 10 no wt  Other Standing Exercises: steering wheel simulation against wall x 2' ROM / Strengthening / Isometric Strengthening UBE (Upper Arm Bike): 6' B UE only 3'fwd/3'bkwd 3.0 Wall Wash: 2' w/1lb wrist weight Thumb Tacks: 2' Over Head Lace: 5:50 2x10 links up and down   Manual Therapy Manual Therapy: Joint mobilization Joint Mobilization: Grade II-III to L GH joint mobs to increase flexion and abd Massage: STM to L bicep to reduce spasms with PROM Other Manual Therapy: PROM to all directions  Physical Therapy Assessment and Plan PT Assessment and Plan Clinical Impression Statement: Pt reports that she is discouraged about her progress. Pt educated on normal timeframe for healing after surgery. Pt is progressing well but is easily fatigued. Pt continues to guard with PROM. Guarding decreases with small oscillations. STM completed to L bicep to decrease pain and mm spasms. Pt reports 0/10 pain but mild soreness at end of session. PT Plan: Continue to progress ROM/strength per PT POC.     Problem List Patient Active Problem List  Diagnoses  .  FIBROIDS, UTERUS  . DM  . HYPERLIPIDEMIA  . ANEMIA, IRON DEFICIENCY, MICROCYTIC  . GERD  . IMPINGEMENT SYNDROME  . RUPTURE ROTATOR CUFF  . Back pain  . Bursitis of shoulder, left  . Rotator cuff tear  . Biceps tendon rupture, proximal  . Rotator cuff tear, left  . S/P shoulder surgery  . Shoulder pain  . Shoulder stiffness  . Muscle weakness (generalized)    PT - End of Session Activity Tolerance: Patient tolerated treatment well General Behavior During Session: [facility] for tasks performed Cognition: [facility] for tasks performed   Seth Bake, PTA 01/31/2012, 12:09 PM

## 2012-02-02 ENCOUNTER — Ambulatory Visit (HOSPITAL_COMMUNITY)
Admission: RE | Admit: 2012-02-02 | Discharge: 2012-02-02 | Disposition: A | Discharge status: Home / Self Care | Payer: BC Managed Care – PPO | Source: Ambulatory Visit

## 2012-02-02 NOTE — Progress Notes (Signed)
Physical Therapy Treatment Patient Details  Name: Ann Vasquez MRN: [id number] Date of Birth: 1946/04/29  Today's Date: 02/02/2012 Time: 1015-1102 PT Time Calculation (min): 47 min  Visit#: 27  of 32   Re-eval: 02/18/12  Charge: therex: 32 min Manual 15 min  Subjective: Symptoms/Limitations Symptoms: Pt reported no pain today. Pain Assessment Currently in Pain?: No/denies  Objective:   Exercise/Treatments Supine Other Supine Exercises: PROM all directions Seated Flexion: 20 reps;Limitations Flexion Limitations: quick Abduction: 20 reps;Limitations ABduction Limitations: quick Other Seated Exercises: Shoulder Circles w/shoulder abduction x20 both directions Standing Other Standing Exercises: Cabinet lifts flex x10 w/1# abd x 10 no wt  Other Standing Exercises: steering wheel simulation against wall x 2' ROM / Strengthening / Isometric Strengthening UBE (Upper Arm Bike): 6' B UE only 3'fwd/3'bkwd 3.0 Wall Wash: 2' w/1lb wrist weight Thumb Tacks: 2' Over Head Lace: 5:21 2x 10 link up and down    Manual Therapy Manual Therapy: Joint mobilization Joint Mobilization: Grade II-III to L GH joint mobs to increase flexion and abd Other Manual Therapy: PROM in all direcitons  Physical Therapy Assessment and Plan PT Assessment and Plan Clinical Impression Statement: Pt continues to be limited by fatigue but improved time with lace up by 29 seconds since last session.  Pt continues to compensate with abduction, required tactile and verbal cueing to eliminate UT contraction with abduction.  PROM improving, pt does conintues to guard, able to reduce the guarding with small oscillations PT Plan: Continue to progress ROM/strength per PT POC.    Goals    Problem List Patient Active Problem List  Diagnoses  . FIBROIDS, UTERUS  . DM  . HYPERLIPIDEMIA  . ANEMIA, IRON DEFICIENCY, MICROCYTIC  . GERD  . IMPINGEMENT SYNDROME  . RUPTURE ROTATOR CUFF  . Back pain  . Bursitis  of shoulder, left  . Rotator cuff tear  . Biceps tendon rupture, proximal  . Rotator cuff tear, left  . S/P shoulder surgery  . Shoulder pain  . Shoulder stiffness  . Muscle weakness (generalized)    PT - End of Session Activity Tolerance: Patient tolerated treatment well;Patient limited by fatigue General Behavior During Session: [facility] for tasks performed Cognition: [facility] for tasks performed  GP No functional reporting required  Juel Burrow 02/02/2012, 11:50 AM

## 2012-02-04 ENCOUNTER — Ambulatory Visit (HOSPITAL_COMMUNITY)
Admission: RE | Admit: 2012-02-04 | Discharge: 2012-02-04 | Disposition: A | Discharge status: Home / Self Care | Payer: BC Managed Care – PPO | Source: Ambulatory Visit | Attending: Internal Medicine | Admitting: Internal Medicine

## 2012-02-04 NOTE — Progress Notes (Signed)
Physical Therapy Treatment Patient Details  Name: Ann Vasquez MRN: [id number] Date of Birth: 05-16-1946  Today's Date: 02/04/2012 Time: 9147-8295 PT Time Calculation (min): 54 min Charges: 77' TE 10' Man, 1 ice Visit#: 28  of 32   Re-eval: 02/18/12   Subjective: Symptoms/Limitations Symptoms: I am a little sore today.  I go back to the [place] on 6/31.  I am still having a hard time doing my hair, putting my shirt over my head and putting my hand behind my back.  Pain Assessment Currently in Pain?: Yes Pain Score:   2 Pain Location: Shoulder Pain Orientation: Left  Precautions/Restrictions     Exercise/Treatments Seated Flexion: AROM;20 reps;Limitations Flexion Limitations: cueing for proper postioning Abduction: AROM;20 reps;Limitations ABduction Limitations: cueing for proper positioning Standing External Rotation: 15 reps;Theraband Theraband Level (Shoulder External Rotation): Level 4 (Blue) Internal Rotation: AROM;Theraband;Other (comment) (Hold 3x60 sec a) Other Standing Exercises: Shoulder extension x15 blue ROM / Strengthening / Isometric Strengthening UBE (Upper Arm Bike): 6' L UE only 3'fwd/3'bkwd 3.0 Wall Wash: 2:20' w/1lb weight Over Head Lace: 5:45 2x 10 link up and down Wall Pushups: 10 reps "W" Arms: 10 X to V Arms: 10 Other ROM/Strengthening Exercises: Prayers above head w/10 sec holds x5    Manual Therapy Manual Therapy: Joint mobilization Joint Mobilization: Grade II-III to L GH joint mobs to increase all ROM  Other Manual Therapy: PROM in all direcitons  Cryotherapy Number Minutes Cryotherapy: 10 Minutes Cryotherapy Location: Shoulder Type of Cryotherapy: Ice pack  Physical Therapy Assessment and Plan PT Assessment and Plan Clinical Impression Statement: Continues to improve with scapular activity tolerance and demonstrates less fatigue.  Continues to requires cueing for appropriate shoulder and scapula activation. Added X-V, W-backs, above  head prayers PT Plan: Cont to progress ROM and functional strength.    Goals    Problem List Patient Active Problem List  Diagnoses  . FIBROIDS, UTERUS  . DM  . HYPERLIPIDEMIA  . ANEMIA, IRON DEFICIENCY, MICROCYTIC  . GERD  . IMPINGEMENT SYNDROME  . RUPTURE ROTATOR CUFF  . Back pain  . Bursitis of shoulder, left  . Rotator cuff tear  . Biceps tendon rupture, proximal  . Rotator cuff tear, left  . S/P shoulder surgery  . Shoulder pain  . Shoulder stiffness  . Muscle weakness (generalized)       GP No functional reporting required  Ann Vasquez 02/04/2012, 3:22 PM

## 2012-02-07 ENCOUNTER — Ambulatory Visit (HOSPITAL_COMMUNITY)
Admission: RE | Admit: 2012-02-07 | Discharge: 2012-02-07 | Disposition: A | Discharge status: Home / Self Care | Payer: BC Managed Care – PPO | Source: Ambulatory Visit | Attending: Internal Medicine | Admitting: Internal Medicine

## 2012-02-07 NOTE — Progress Notes (Signed)
Physical Therapy Treatment Patient Details  Name: SAADIA DEWITT MRN: [id number] Date of Birth: 1945/11/10  Today's Date: 02/07/2012 Time: 6295-2841 PT Time Calculation (min): 52 min  Visit#: 29  of 32   Re-eval: 02/18/12  Charge: therex 31 min Manual 15 min  Subjective: Symptoms/Limitations Symptoms: Pain free at entrance today, had a good weekend.  Stilll having difficulty putting my arms over head. Pain Assessment Currently in Pain?: No/denies  Objective:   Exercise/Treatments Supine Other Supine Exercises: PROM all directions Seated Flexion: AROM;20 reps;Limitations Flexion Limitations: cueing for proper postioning Abduction: AROM;20 reps;Limitations ABduction Limitations: cueing for proper positioning Standing External Rotation: 15 reps;Theraband Theraband Level (Shoulder External Rotation): Level 4 (Blue) Internal Rotation: AROM;Theraband;Other (comment) (Hold 3x60 sec a) Other Standing Exercises: Shoulder extension x15 blue ROM / Strengthening / Isometric Strengthening UBE (Upper Arm Bike): 6' L UE only 3'fwd/3'bkwd 3.0 Wall Wash: 2:20' w/1lb weight Over Head Lace:  4: 56 2x 10 link up and down Wall Pushups: 10 reps "W" Arms: 10 X to V Arms: 10 Other ROM/Strengthening Exercises: Prayers above head w/10 sec holds x5      Physical Therapy Assessment and Plan PT Assessment and Plan Clinical Impression Statement: Pt improving shoulder endurance, improved time with lace up and less cueing required to deactive UT. PT Plan: Cont to progress ROM and functional strength    Goals    Problem List Patient Active Problem List  Diagnoses  . FIBROIDS, UTERUS  . DM  . HYPERLIPIDEMIA  . ANEMIA, IRON DEFICIENCY, MICROCYTIC  . GERD  . IMPINGEMENT SYNDROME  . RUPTURE ROTATOR CUFF  . Back pain  . Bursitis of shoulder, left  . Rotator cuff tear  . Biceps tendon rupture, proximal  . Rotator cuff tear, left  . S/P shoulder surgery  . Shoulder pain  . Shoulder  stiffness  . Muscle weakness (generalized)    PT - End of Session Activity Tolerance: Patient tolerated treatment well General Behavior During Session: [facility] for tasks performed Cognition: [facility] for tasks performed  GP No functional reporting required  Juel Burrow, PTA 02/07/2012, 6:50 PM

## 2012-02-09 ENCOUNTER — Ambulatory Visit (HOSPITAL_COMMUNITY)
Admission: RE | Admit: 2012-02-09 | Discharge: 2012-02-09 | Disposition: A | Discharge status: Home / Self Care | Payer: BC Managed Care – PPO | Source: Ambulatory Visit | Attending: Internal Medicine | Admitting: Internal Medicine

## 2012-02-09 NOTE — Progress Notes (Signed)
Physical Therapy Treatment Patient Details  Name: Ann Vasquez MRN: [id number] Date of Birth: February 07, 1946  Today's Date: 02/09/2012 Time: 9147-8295 PT Time Calculation (min): 57 min Charges: 35' TE, 10' Manual, 10' ice Visit#: 30  of 32   Re-eval: 02/18/12   Subjective: Symptoms/Limitations Symptoms: Pt reports that she is still doing well. Continues to have difficulty putting her arms overhead.  Pain Assessment Currently in Pain?: No/denies  Precautions/Restrictions     Exercise/Treatments Supine Other Supine Exercises: Star Gazers x15 Prone  Extension: Both;10 reps;Other (comment) (palm down) External Rotation: 10 reps;Theraband;Other (comment) (POE) Theraband Level (Shoulder External Rotation): Level 2 (Red) Horizontal ABduction 1: 5 reps;Both Other Prone Exercises: W-backs x5 Standing External Rotation: 15 reps;Other (comment) (5 sec holds) Theraband Level (Shoulder External Rotation): Level 3 (Green) Internal Rotation: 15 reps;Other (comment);Theraband (5 sec hold) Theraband Level (Shoulder Internal Rotation): Level 4 (Blue);Other (comment) (Holding both bands) Flexion: AROM;Left;15 reps;Weights Shoulder Flexion Weight (lbs): 1 ABduction: Left;Weights;15 reps Shoulder ABduction Weight (lbs): 1 ROM / Strengthening / Isometric Strengthening UBE (Upper Arm Bike): 6' L UE only 3'fwd/3'bkwd 3.0 Over Head Lace:  Time:3:27,  2x 10 link up and down    Manual Therapy Manual Therapy: Joint mobilization Joint Mobilization: Grade I-II to GH to decrease pain to [facility].  Massage: STM to deltodi region to decrease pain.  Other Manual Therapy: PROM all directions.   Physical Therapy Assessment and Plan PT Assessment and Plan Clinical Impression Statement: Added prone exercises to decrease UT compensation.  Pt had notable cramping during prone flexion.  Had decreased pain after manual treatment and ice.  PT Plan: Cont to progress ROM and functional strength    Goals     Problem List Patient Active Problem List  Diagnosis  . FIBROIDS, UTERUS  . DM  . HYPERLIPIDEMIA  . ANEMIA, IRON DEFICIENCY, MICROCYTIC  . GERD  . IMPINGEMENT SYNDROME  . RUPTURE ROTATOR CUFF  . Back pain  . Bursitis of shoulder, left  . Rotator cuff tear  . Biceps tendon rupture, proximal  . Rotator cuff tear, left  . S/P shoulder surgery  . Shoulder pain  . Shoulder stiffness  . Muscle weakness (generalized)    PT - End of Session Activity Tolerance: Patient tolerated treatment well General Behavior During Session: [facility] for tasks performed Cognition: [facility] for tasks performed  GP No functional reporting required  Albertus Chiarelli 02/09/2012, 11:23 AM

## 2012-02-10 ENCOUNTER — Ambulatory Visit (HOSPITAL_COMMUNITY)
Admission: RE | Admit: 2012-02-10 | Discharge: 2012-02-10 | Disposition: A | Discharge status: Home / Self Care | Payer: BC Managed Care – PPO | Source: Ambulatory Visit | Attending: Internal Medicine | Admitting: Internal Medicine

## 2012-02-10 NOTE — Progress Notes (Signed)
Physical Therapy Treatment Patient Details  Name: Ann Vasquez MRN: [id number] Date of Birth: 12-12-1945  Today's Date: 02/10/2012 Time: 1115-1204 PT Time Calculation (min): 49 min Visit#: 31  of 32   Re-eval: 02/18/12 Charges: Therex x 32' Manual x 8'   Subjective: Symptoms/Limitations Symptoms: Pt states she has some discomfort in her shoulder but it's not pain. Pain Assessment Currently in Pain?: No/denies   Exercise/Treatments Supine Other Supine Exercises: PROM all directions Prone  Extension: Both;10 reps;Other (comment) (palms down) External Rotation: 10 reps;Theraband;Other (comment) (POE) Theraband Level (Shoulder External Rotation): Level 2 (Red) Horizontal ABduction 1: 10 reps Other Prone Exercises: W-back x10 Standing External Rotation: 15 reps;Other (comment) (5 " holds) Theraband Level (Shoulder External Rotation): Level 3 (Green) Internal Rotation: 15 reps;Other (comment);Theraband (5" hold) Theraband Level (Shoulder Internal Rotation): Level 4 (Blue);Other (comment) (Holding both bands) Flexion: 15 reps Shoulder Flexion Weight (lbs): 1 ABduction: 15 reps Shoulder ABduction Weight (lbs): 1 ROM / Strengthening / Isometric Strengthening UBE (Upper Arm Bike): 6' L UE only 3'fwd/3'bkwd 3.0 Over Head Lace:  Time 4:28,  2x 10 link up and down       Manual Therapy Manual Therapy: Joint mobilization Joint Mobilization: Grade I-II to GH to decrease pain to [facility]. Myofascial Release: to decrease fascial restrcitions to RTC insertion Other Manual Therapy: PROM all directions  Physical Therapy Assessment and Plan PT Assessment and Plan Clinical Impression Statement: Pt completes therex with decrease UT compensation and with improved AROM. Pt also presents with less guarding during PROM. Pt reports decrease discomfort at end of session. PT Plan: Continue to progress per PT POC.     Problem List Patient Active Problem List  Diagnosis  . FIBROIDS,  UTERUS  . DM  . HYPERLIPIDEMIA  . ANEMIA, IRON DEFICIENCY, MICROCYTIC  . GERD  . IMPINGEMENT SYNDROME  . RUPTURE ROTATOR CUFF  . Back pain  . Bursitis of shoulder, left  . Rotator cuff tear  . Biceps tendon rupture, proximal  . Rotator cuff tear, left  . S/P shoulder surgery  . Shoulder pain  . Shoulder stiffness  . Muscle weakness (generalized)    PT - End of Session Activity Tolerance: Patient tolerated treatment well General Behavior During Session: [facility] for tasks performed Cognition: [facility] for tasks performed   Seth Bake, PTA 02/10/2012, 12:14 PM

## 2012-02-14 ENCOUNTER — Ambulatory Visit (HOSPITAL_COMMUNITY)
Admission: RE | Admit: 2012-02-14 | Discharge: 2012-02-14 | Disposition: A | Discharge status: Home / Self Care | Payer: BC Managed Care – PPO | Source: Ambulatory Visit | Attending: Internal Medicine | Admitting: Internal Medicine

## 2012-02-14 NOTE — Progress Notes (Signed)
Physical Therapy Treatment Patient Details  Name: Ann Vasquez MRN: [id number] Date of Birth: Jul 23, 1946  Today's Date: 02/14/2012 Time: 1022-1118 PT Time Calculation (min): 56 min  Visit#: 32  of 32   Re-eval: 02/18/12 Charges: Manual x 8' Therex x 30'   Subjective: Symptoms/Limitations Symptoms: Pt states she is having som pain in her L shoudler and wrist. Pain Assessment Currently in Pain?: Yes Pain Score:   2 Pain Location: Shoulder Pain Orientation: Left Multiple Pain Sites: Yes   Exercise/Treatments Prone  Extension: 15 reps;Both Extension Limitations: palms down External Rotation: 10 reps;Theraband;Other (comment);Limitations Theraband Level (Shoulder External Rotation): Level 2 (Red) External Rotation Limitations: POE Horizontal ABduction 1: 10 reps Other Prone Exercises: W-back x10 Standing External Rotation: 15 reps Theraband Level (Shoulder External Rotation): Level 3 (Green) External Rotation Limitations: 5" hold Internal Rotation: 15 reps;Limitations Theraband Level (Shoulder Internal Rotation): Level 4 (Blue);Other (comment) Internal Rotation Limitations: 5" hold Flexion: 20 reps Shoulder Flexion Weight (lbs): 1 ABduction: 20 reps Shoulder ABduction Weight (lbs): 1 Extension: 20 reps;Theraband ROM / Strengthening / Isometric Strengthening UBE (Upper Arm Bike): 6' L UE only 3'fwd/3'bkwd 3.0 Over Head Lace:  Time 5:20,  2x 10 link up and down     Manual Therapy Manual Therapy: Joint mobilization Joint Mobilization: Grade I-II to GH to improve flexion and abduction. Other Manual Therapy: PROM all directions Cryotherapy Number Minutes Cryotherapy: 10 Minutes Cryotherapy Location: Shoulder Type of Cryotherapy: Ice pack  Physical Therapy Assessment and Plan PT Assessment and Plan Clinical Impression Statement: Began body blade to improve shoulder stability with multimodal cueing to correctly complete. Pt continues to progress well and mm  endurance appears to be improving. Pt does occasionally require multimodal cueing to avoid UT compensation. Ice applied to L shoulder at end of session to limit pain and inflammation. Pt reports 0/10 pain at end of session. PT Plan: Continue it progress shoulder ROM, strength and stability per PT POC.     Problem List Patient Active Problem List  Diagnosis  . FIBROIDS, UTERUS  . DM  . HYPERLIPIDEMIA  . ANEMIA, IRON DEFICIENCY, MICROCYTIC  . GERD  . IMPINGEMENT SYNDROME  . RUPTURE ROTATOR CUFF  . Back pain  . Bursitis of shoulder, left  . Rotator cuff tear  . Biceps tendon rupture, proximal  . Rotator cuff tear, left  . S/P shoulder surgery  . Shoulder pain  . Shoulder stiffness  . Muscle weakness (generalized)    PT - End of Session Activity Tolerance: Patient tolerated treatment well General Behavior During Session: [facility] for tasks performed Cognition: [facility] for tasks performed    Seth Bake, PTA 02/14/2012, 11:41 AM

## 2012-02-16 ENCOUNTER — Ambulatory Visit (HOSPITAL_COMMUNITY)
Admission: RE | Admit: 2012-02-16 | Discharge: 2012-02-16 | Disposition: A | Discharge status: Home / Self Care | Payer: BC Managed Care – PPO | Source: Ambulatory Visit | Attending: Internal Medicine | Admitting: Internal Medicine

## 2012-02-16 NOTE — Progress Notes (Signed)
Physical Therapy Treatment Patient Details  Name: Ann Vasquez MRN: [id number] Date of Birth: [birth year]  Today's Date: 02/16/2012 Time: 6962-9528 PT Time Calculation (min): 54 min Charges: 1 ice, 10' manual, 33' TE Visit#: 33  of    Re-eval: 02/18/12    Subjective: Symptoms/Limitations Symptoms: L shoulder feeling good today, no pain. Pain Assessment Currently in Pain?: No/denies  Precautions/Restrictions     Exercise/Treatments Mobility/Balance        Supine   Seated   Prone    Sidelying   Standing External Rotation: 20 reps Theraband Level (Shoulder External Rotation): Level 4 (Blue) Internal Rotation: 20 reps;Theraband Theraband Level (Shoulder Internal Rotation): Level 4 (Blue) Internal Rotation Limitations: IR behind the back 20x Pulleys   Therapy Ball   ROM / Strengthening / Isometric Strengthening UBE (Upper Arm Bike): 6' L UE only 3'fwd/3'bkwd 3.0 Wall Wash: 5' Over Head Lace: Time for 2x:   total 5 minutes   Stretches   Power Physiological scientist Flexion: 3 reps;15 seconds ABduction: 15 seconds;3 reps External Rotation: 15 seconds;3 reps         Modalities Modalities: Cryotherapy Manual Therapy Manual Therapy: Joint mobilization Joint Mobilization: Grade II-III to improve Flexion and ER w/PROM after Myofascial Release: to deltoid region to decrese fascial restricitons Cryotherapy Number Minutes Cryotherapy: 10 Minutes Cryotherapy Location: Shoulder Type of Cryotherapy: Ice pack  Physical Therapy Assessment and Plan PT Assessment and Plan Clinical Impression Statement: Continues to be most limited by increased referred pain to deltoid region. Able to demonstrate improve muscular strength and activity tolerance with today's activities.  PT Plan: Re-evaluate next visit    Goals    Problem List Patient Active Problem List  Diagnosis  . FIBROIDS, UTERUS  . DM  . HYPERLIPIDEMIA  . ANEMIA, IRON DEFICIENCY, MICROCYTIC  .  GERD  . IMPINGEMENT SYNDROME  . RUPTURE ROTATOR CUFF  . Back pain  . Bursitis of shoulder, left  . Rotator cuff tear  . Biceps tendon rupture, proximal  . Rotator cuff tear, left  . S/P shoulder surgery  . Shoulder pain  . Shoulder stiffness  . Muscle weakness (generalized)    PT - End of Session Activity Tolerance: Patient tolerated treatment well General Behavior During Session: [facility] for tasks performed Cognition: [facility] for tasks performed  GP No functional reporting required  Sutton Plake 02/16/2012, 11:15 AM

## 2012-02-17 ENCOUNTER — Other Ambulatory Visit (HOSPITAL_COMMUNITY): Payer: Self-pay | Admitting: Family Medicine

## 2012-02-17 DIAGNOSIS — Z139 Encounter for screening, unspecified: Secondary | ICD-10-CM

## 2012-02-18 ENCOUNTER — Ambulatory Visit (HOSPITAL_COMMUNITY)
Admission: RE | Admit: 2012-02-18 | Discharge: 2012-02-18 | Disposition: A | Discharge status: Home / Self Care | Payer: BC Managed Care – PPO | Source: Ambulatory Visit | Attending: Internal Medicine | Admitting: Internal Medicine

## 2012-02-18 NOTE — Evaluation (Signed)
Physical Therapy Re-Evaluation  Patient Details  Name: Ann Vasquez MRN: [id number] Date of Birth: 06-25-1946  Today's Date: 02/18/2012 Time: 1102-1158 PT Time Calculation (min): 56 min Charges: 1 ROM, 1 MMT, 15' Manual, 30' TE Visit#: 35  of 41   Re-eval: 03/05/12 Assessment Diagnosis: Lt RTC repair Surgical Date: 10/29/11 Next MD Visit: Dr. Romeo Apple 04/05/2012  Past Medical History:  Past Medical History  Diagnosis Date  . HTN (hypertension)   . High cholesterol   . Diabetes mellitus type 2, diet-controlled   . DJD (degenerative joint disease) of cervical spine    Past Surgical History:  Past Surgical History  Procedure Date  . Right shoulder 2011    rotator cuff repair, Romeo Apple, [facility]  . Shoulder open rotator cuff repair 10/29/2011    Procedure: left ROTATOR CUFF REPAIR SHOULDER OPEN;  Surgeon: Fuller Canada, MD;  Location: [facility];  Service: Orthopedics;  Laterality: Left;    Subjective Symptoms/Limitations Symptoms: I am doing pretty well.  I still have a hard time doing my hair.  I have some concerns about my strength and returning to work activities.  Pain Assessment Currently in Pain?: No/denies  Assessment LUE AROM (degrees) LUE Overall AROM Comments: All measurments taken in seated position except IR and Er Left Shoulder Extension: 65 Degrees (was 65) Left Shoulder Flexion: 155 Degrees (was 138) Left Shoulder ABduction: 105 Degrees (was 75) Left Shoulder Internal Rotation: 80 Degrees (was 80) Left Shoulder External Rotation: 62 Degrees (was 35) LUE PROM (degrees) LUE Overall PROM Comments: Measurments taken in Supine position Left Shoulder Flexion: 155 Degrees (was 145) Left Shoulder ABduction: 110 Degrees (was 100) Left Shoulder Internal Rotation: 80 Degrees (was 80) Left Shoulder External Rotation: 55 Degrees (was 43) LUE Strength Left Shoulder Flexion: 4/5 (was 3+/5) Left Shoulder Extension: 5/5 (was 5/5) Left Shoulder ABduction: 3+/5 (was  3+/5) Left Shoulder Internal Rotation: 5/5 (was 5/5) Left Shoulder External Rotation: 5/5 (was 5/5) Left Elbow Flexion: 5/5 (was 4/5) Left Elbow Extension: 5/5 (was 4/5)  Exercise/Treatments Prone  Extension: 10 reps;Other (comment) (palms down, 5 sec hold) External Rotation: 20 reps External Rotation Limitations: POE  Horizontal ABduction 1: 10 reps Other Prone Exercises: W-back x5 Other Prone Exercises: Serratus Anterior strengthening 10x5 sec holds Standing Internal Rotation: 20 reps;Theraband (behind bacl) Theraband Level (Shoulder Internal Rotation): Level 4 (Blue) ABduction: 10 reps;Theraband Theraband Level (Shoulder ABduction): Level 2 (Red) Other Standing Exercises: Counter to counter 8# box transfer (using low handles) to simulate work activities x10   ROM / Strengthening / Isometric Strengthening UBE (Upper Arm Bike): 6' L UE only 3'fwd/3'bkwd 3.0 Manual Therapy Manual Therapy: Joint mobilization Massage: Grade II-III to improve Flexion and ER w/PROM after in all directions Other Manual Therapy: STM to deltodi region to decrease pain  Physical Therapy Assessment and Plan PT Assessment and Plan Clinical Impression Statement: Ann Vasquez has attended 55 OP PT visits to s/p L RTC repair with the following findins: she has met all STG and  progressiong towards all  LTG.  She has made considerable gains with her functional AROM.  Overall her strength continues to improve except for shoulder abduction/scaption.  Pt did well with treatment today, limited by moderate pain w/prone flexion activities.  Pt will benefit from skilled therapeutic intervention in order to improve on the following deficits: Decreased strength;Decreased range of motion;Pain;Decreased activity tolerance Rehab Potential: Good PT Frequency: Min 2X/week PT Duration: Other (comment) (3 weeks) PT Treatment/Interventions: Therapeutic activities;Therapeutic exercise;Manual techniques;Modalities PT Plan:  continue to focus on  functional strength and less ROM. 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     Goals Home Exercise Program Pt will Perform Home Exercise Program: Independently PT Goal: Perform Home Exercise Program - Progress: Met PT Short Term Goals Time to Complete Short Term Goals: 4 weeks PT Short Term Goal 1: Pt will improve L shoulder PROM: flexion 0-100; abduction 0-90; ER: 0-25; IR: 0-45 PT Short Term Goal 1 - Progress: Met PT Short Term Goal 2: Pt will present with decreased fascial restrcitions. PT Short Term Goal 2 - Progress: Met PT Short Term Goal 3: Pt will report pain less than 5/10 for 50% of her day to decrease dependence on pain medication.  PT Short Term Goal 3 - Progress: Met PT Short Term Goal 4: Pt will improve her scapular and shoulder stabalizer endurance and demonstrate 10 shoulder approximations x10 sec without cueing.  PT Short Term Goal 4 - Progress: Met PT Long Term Goals Time to Complete Long Term Goals: 12 weeks PT Long Term Goal 1: Pt will improve shoulder AROM to WNL in order to complete necessary ADL's. PT Long Term Goal 1 - Progress: Partly met (difficulty with completing her hair) PT Long Term Goal 2: Pt will improve shoulder strength to WNL and demonstrate lifting a 20lb pan from counter to transfer to another counter to simulate return to work activities.  PT Long Term Goal 2 - Progress: Progressing toward goal (8# box) Long Term Goal 3: Pt will improve her UEFS score to 60/80 for improved percieved functional ability.  Long Term Goal 3 Progress: Progressing toward goal (56/80)  Problem List Patient Active Problem List  Diagnosis  . FIBROIDS, UTERUS  . DM  . HYPERLIPIDEMIA  . ANEMIA, IRON DEFICIENCY, MICROCYTIC  . GERD  . IMPINGEMENT SYNDROME  . RUPTURE ROTATOR CUFF  . Back pain  . Bursitis of shoulder, left  . Rotator cuff tear   . Biceps tendon rupture, proximal  . Rotator cuff tear, left  . S/P shoulder surgery  . Shoulder pain  . Shoulder stiffness  . Muscle weakness (generalized)    PT - End of Session Activity Tolerance: Patient tolerated treatment well General Behavior During Session: [facility] for tasks performed Cognition: [facility] for tasks performed PT Plan of Care PT Home Exercise Plan: instructed to continue with some band activities w/blue t-band Consulted and Agree with Plan of Care: Patient  Keymani Glynn 02/18/2012, 12:10 PM  Physician Documentation Your signature is required to indicate approval of the treatment plan as stated above.  Please sign and either send electronically or make a copy of this report for your files and return this physician signed original.   Please mark one 1.__approve of plan  2. ___approve of plan with the following conditions.   ______________________________                                                          _____________________ Physician Signature  Date  

## 2012-02-22 ENCOUNTER — Ambulatory Visit (HOSPITAL_COMMUNITY)
Admission: RE | Admit: 2012-02-22 | Discharge: 2012-02-22 | Disposition: A | Discharge status: Home / Self Care | Payer: BC Managed Care – PPO | Source: Ambulatory Visit | Attending: Internal Medicine | Admitting: Internal Medicine

## 2012-02-22 NOTE — Progress Notes (Signed)
Physical Therapy Treatment Patient Details  Name: Ann Vasquez MRN: [id number] Date of Birth: Sep 12, 1945  Today's Date: 02/22/2012 Time: 2130-8657 PT Time Calculation (min): 48 min  Visit#: 36  of 41   Re-eval: 03/05/12 Charges: Therex x 40'   Subjective: Symptoms/Limitations Symptoms: Pt states that fixing her hair is getting better. Pain Assessment Currently in Pain?: No/denies   Exercise/Treatments Prone  Extension: 10 reps;Other (comment) External Rotation: 20 reps Theraband Level (Shoulder External Rotation): Level 2 (Red) External Rotation Limitations: POE  Horizontal ABduction 1: 10 reps Other Prone Exercises: W-back x5 Other Prone Exercises: Serratus Anterior strengthening 10x5 sec holds Standing External Rotation: 20 reps Theraband Level (Shoulder External Rotation): Level 4 (Blue) Internal Rotation: 20 reps;Theraband Theraband Level (Shoulder Internal Rotation): Level 4 (Blue) ABduction: 15 reps Theraband Level (Shoulder ABduction): Level 2 (Red) Other Standing Exercises: Counter to counter 13# box transfer (using low handles) to simulate work activities x10 Other Standing Exercises: Cabinet reach flex/abd x10 each w/2# ROM / Strengthening / Isometric Strengthening UBE (Upper Arm Bike): 6' L UE only 3'fwd/3'bkwd 3.0 Wall Wash: 5' Body Blade Flexion: 15 seconds;2 reps ABduction: 15 seconds;2 reps  Physical Therapy Assessment and Plan PT Assessment and Plan Clinical Impression Statement: Tx focus on in improving functional strength. Pt tolerates increased wt in box for work simulation without difficulty. Pt continues to require vc's to avoid UT compensation but requires them less frequently. Pt is without complaint throughout session and reports 0/10 pain at end of session. PT Duration:  (3 weeks) PT Plan: Continue to focus on functional strength per PT POC.     Problem List Patient Active Problem List  Diagnosis  . FIBROIDS, UTERUS  . DM  .  HYPERLIPIDEMIA  . ANEMIA, IRON DEFICIENCY, MICROCYTIC  . GERD  . IMPINGEMENT SYNDROME  . RUPTURE ROTATOR CUFF  . Back pain  . Bursitis of shoulder, left  . Rotator cuff tear  . Biceps tendon rupture, proximal  . Rotator cuff tear, left  . S/P shoulder surgery  . Shoulder pain  . Shoulder stiffness  . Muscle weakness (generalized)    PT - End of Session Activity Tolerance: Patient tolerated treatment well General Behavior During Session: [facility] for tasks performed Cognition: [facility] for tasks performed   Seth Bake, PTA 02/22/2012, 1:59 PM

## 2012-02-24 ENCOUNTER — Ambulatory Visit (HOSPITAL_COMMUNITY)
Admission: RE | Admit: 2012-02-24 | Discharge: 2012-02-24 | Disposition: A | Discharge status: Home / Self Care | Payer: BC Managed Care – PPO | Source: Ambulatory Visit | Attending: Internal Medicine | Admitting: Internal Medicine

## 2012-02-24 NOTE — Progress Notes (Signed)
Physical Therapy Treatment Patient Details  Name: Ann Vasquez MRN: [id number] Date of Birth: [birth year]  Today's Date: 02/24/2012 Time: 1649-1734 PT Time Calculation (min): 45 min  Visit#: 37  of 41   Re-eval: 03/05/12 Charges: Therex x 38'   Subjective: Symptoms/Limitations Symptoms: Pt is pain free but she states it is still difficulty to wash her back. Pain Assessment Pain Score: 0-No pain   Exercise/Treatments Prone  Extension: 15 reps External Rotation: 10 reps Theraband Level (Shoulder External Rotation): Level 2 (Red) External Rotation Limitations: POE 5" holds Horizontal ABduction 1: 15 reps Other Prone Exercises: W-back x15 Other Prone Exercises: Serratus Anterior strengthening 10x5 sec holds Standing External Rotation: 20 reps Theraband Level (Shoulder External Rotation): Level 4 (Blue) Internal Rotation: 20 reps;Theraband Theraband Level (Shoulder Internal Rotation): Level 4 (Blue) Flexion: 20 reps;Theraband Theraband Level (Shoulder Flexion): Level 2 (Red) ABduction: 20 reps Theraband Level (Shoulder ABduction): Level 2 (Red) Other Standing Exercises: Counter to counter 10# in baking pan sitting on small sliding board x 10 Other Standing Exercises: Cabinet reach flex/abd x15 each w/2# (w/o resting wt on cabinet) Stretches Internal Rotation Stretch: 3 reps Internal Rotation Stretch Limitations: 30" holds with towel   Manual Therapy Manual Therapy: Joint mobilization Joint Mobilization: Grade II-III to improve Flexion flexion and abduction  Physical Therapy Assessment and Plan PT Assessment and Plan Clinical Impression Statement: Began IR towel stretch in pain free zone to decrease IR tightness as this limits pt w/ ADL's. Changed work simulation but Administrator, arts with 10# as this is more similar to what pt would be lifting at work. Pt has increased difficulty with this but reports no pain associated with this activity. Pt reports 0/10 pain at  end of session and a decrease in stiffness. PT Duration:  (3 weeks) PT Plan: Continue to focus on functional strength per PT POC.     Problem List Patient Active Problem List  Diagnosis  . FIBROIDS, UTERUS  . DM  . HYPERLIPIDEMIA  . ANEMIA, IRON DEFICIENCY, MICROCYTIC  . GERD  . IMPINGEMENT SYNDROME  . RUPTURE ROTATOR CUFF  . Back pain  . Bursitis of shoulder, left  . Rotator cuff tear  . Biceps tendon rupture, proximal  . Rotator cuff tear, left  . S/P shoulder surgery  . Shoulder pain  . Shoulder stiffness  . Muscle weakness (generalized)    PT - End of Session Activity Tolerance: Patient tolerated treatment well General Behavior During Session: [facility] for tasks performed Cognition: [facility] for tasks performed  Seth Bake, PTA 02/24/2012, 5:48 PM

## 2012-02-29 ENCOUNTER — Ambulatory Visit (HOSPITAL_COMMUNITY)
Admission: RE | Admit: 2012-02-29 | Discharge: 2012-02-29 | Disposition: A | Discharge status: Home / Self Care | Payer: BC Managed Care – PPO | Source: Ambulatory Visit | Attending: Internal Medicine | Admitting: Internal Medicine

## 2012-02-29 DIAGNOSIS — E785 Hyperlipidemia, unspecified: Secondary | ICD-10-CM | POA: Insufficient documentation

## 2012-02-29 DIAGNOSIS — E119 Type 2 diabetes mellitus without complications: Secondary | ICD-10-CM | POA: Insufficient documentation

## 2012-02-29 DIAGNOSIS — M6281 Muscle weakness (generalized): Secondary | ICD-10-CM | POA: Insufficient documentation

## 2012-02-29 DIAGNOSIS — M25619 Stiffness of unspecified shoulder, not elsewhere classified: Secondary | ICD-10-CM | POA: Insufficient documentation

## 2012-02-29 DIAGNOSIS — IMO0001 Reserved for inherently not codable concepts without codable children: Secondary | ICD-10-CM | POA: Insufficient documentation

## 2012-02-29 DIAGNOSIS — M25519 Pain in unspecified shoulder: Secondary | ICD-10-CM | POA: Insufficient documentation

## 2012-02-29 NOTE — Progress Notes (Signed)
Physical Therapy Treatment Patient Details  Name: Ann Vasquez MRN: [id number] Date of Birth: November 19, 1945  Today's Date: 02/29/2012 Time: 4098-1191 PT Time Calculation (min): 43 min Charges: TE x43' Visit#: 38  of 41   Re-eval: 03/05/12    Subjective: Symptoms/Limitations Symptoms: Pt reports that she was hoping that her strength would come back a little faster then it is. 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  Precautions/Restrictions     Exercise/Treatments Standing Other Standing Exercises: Putting Dishes away in upper cabinet x2 minutes Other Standing Exercises: 8lb pan into and out of oven x15 ROM / Strengthening / Isometric Strengthening UBE (Upper Arm Bike): 8 min LUE only 4 fwd/4bkwd Cybex Press: 1 plate;Other (comment) (3 sets 10 reps, BUE) Cybex Row: Other (comment);1.5 plate (3 sets 10 reps, BUE) Other ROM/Strengthening Exercises: Counter to counter transfers w/18 lb box x15 Other ROM/Strengthening Exercises: 18lb Carton Transfer from 12 inch off ground to cart w/pushing around the gym x6   Physical Therapy Assessment and Plan PT Assessment and Plan Clinical Impression Statement: Treatment fouces on simulating work related activities to ensure safe return to work and improving functional strength.  Able to complete all activities without increased pain, reports slight pulling to her bicep region.  PT Duration:  (3 weeks) PT Plan: Continue to focus on functional strength per PT POC.    Goals    Problem List Patient Active Problem List  Diagnosis  . FIBROIDS, UTERUS  . DM  . HYPERLIPIDEMIA  . ANEMIA, IRON DEFICIENCY, MICROCYTIC  . GERD  . IMPINGEMENT SYNDROME  . RUPTURE ROTATOR CUFF  . Back pain  . Bursitis of shoulder, left  . Rotator cuff tear  . Biceps tendon rupture, proximal  . Rotator cuff tear, left  . S/P shoulder surgery  . Shoulder pain  . Shoulder stiffness  . Muscle weakness (generalized)    PT - End of  Session Activity Tolerance: Patient tolerated treatment well General Behavior During Session: [facility] for tasks performed Cognition: [facility] for tasks performed  GP    Breyer Tejera 02/29/2012, 10:30 AM

## 2012-03-01 ENCOUNTER — Ambulatory Visit (HOSPITAL_COMMUNITY)
Admission: RE | Admit: 2012-03-01 | Discharge: 2012-03-01 | Disposition: A | Discharge status: Home / Self Care | Payer: BC Managed Care – PPO | Source: Ambulatory Visit | Attending: Internal Medicine | Admitting: Internal Medicine

## 2012-03-01 ENCOUNTER — Ambulatory Visit (HOSPITAL_COMMUNITY)
Admission: RE | Admit: 2012-03-01 | Discharge: 2012-03-01 | Disposition: A | Discharge status: Home / Self Care | Payer: BC Managed Care – PPO | Source: Ambulatory Visit | Attending: Family Medicine | Admitting: Family Medicine

## 2012-03-01 DIAGNOSIS — Z139 Encounter for screening, unspecified: Secondary | ICD-10-CM

## 2012-03-01 DIAGNOSIS — R928 Other abnormal and inconclusive findings on diagnostic imaging of breast: Secondary | ICD-10-CM | POA: Insufficient documentation

## 2012-03-01 NOTE — Progress Notes (Signed)
Physical Therapy Treatment Patient Details  Name: Ann Vasquez MRN: [id number] Date of Birth: 1946-05-14  Today's Date: 03/01/2012 Time: 4401-0272 PT Time Calculation (min): 47 min  Visit#: 39  of 41   Re-eval: 03/05/12 Charges: Therex x 38'   Subjective: Symptoms/Limitations Symptoms: Pt states that she feels that she is able to use her arm more. Pt states "I'm feeling more human now." Pain Assessment Currently in Pain?: No/denies   Exercise/Treatments Standing Other Standing Exercises: Putting Dishes away in upper cabinet x2 minutes Other Standing Exercises: Mopping simulations with 5# dowel x 2' B ROM / Strengthening / Isometric Strengthening UBE (Upper Arm Bike): 8 min LUE only 4 fwd/4bkwd Cybex Press: 1 plate;Limitations Cybex Press Limitations: 3x10 BUE Cybex Row: Limitations Cybex Row Limitations: 3x10 BUE Other ROM/Strengthening Exercises: Stove to raised table transfer x to with 8# tray Other ROM/Strengthening Exercises: 18lb Carton Transfer from 12 inch off ground to cart w/pushing around the gym; 13# box transfer floor to cabint x 10   Physical Therapy Assessment and Plan PT Assessment and Plan Clinical Impression Statement: Continued work related activities this session. Pt is able to completes all activities well with minimal difficulty. Pt educated on importance of utilizing good body mechanics with work activities. Pt reports no increase in pain at end of session. PT Duration:  (3 weeks) PT Plan: Reassess next session.     Problem List Patient Active Problem List  Diagnosis  . FIBROIDS, UTERUS  . DM  . HYPERLIPIDEMIA  . ANEMIA, IRON DEFICIENCY, MICROCYTIC  . GERD  . IMPINGEMENT SYNDROME  . RUPTURE ROTATOR CUFF  . Back pain  . Bursitis of shoulder, left  . Rotator cuff tear  . Biceps tendon rupture, proximal  . Rotator cuff tear, left  . S/P shoulder surgery  . Shoulder pain  . Shoulder stiffness  . Muscle weakness (generalized)    PT -  End of Session Activity Tolerance: Patient tolerated treatment well General Behavior During Session: [facility] for tasks performed Cognition: [facility] for tasks performed  Seth Bake, PTA 03/01/2012, 12:18 PM

## 2012-03-07 ENCOUNTER — Ambulatory Visit (HOSPITAL_COMMUNITY)
Admission: RE | Admit: 2012-03-07 | Discharge: 2012-03-07 | Disposition: A | Discharge status: Home / Self Care | Payer: BC Managed Care – PPO | Source: Ambulatory Visit | Attending: Internal Medicine | Admitting: Internal Medicine

## 2012-03-07 NOTE — Progress Notes (Addendum)
Physical Therapy Discharge Summary and Tx  Patient Details  Name: Ann Vasquez MRN: [id number] Date of Birth: Aug 03, 1946  Today's Date: 03/07/2012 Time: 4098-1191 PT Time Calculation (min): 41 min  Visit#: 1348  of 41   Re-eval: 03/05/12 Charges: MMT x 1 ROMM x 1 self care x 10'   Past Medical History:  Past Medical History  Diagnosis Date  . HTN (hypertension)   . High cholesterol   . Diabetes mellitus type 2, diet-controlled   . DJD (degenerative joint disease) of cervical spine    Past Surgical History:  Past Surgical History  Procedure Date  . Right shoulder 2011    rotator cuff repair, Romeo Apple, [facility]  . Shoulder open rotator cuff repair 10/29/2011    Procedure: left ROTATOR CUFF REPAIR SHOULDER OPEN;  Surgeon: Fuller Canada, MD;  Location: [facility];  Service: Orthopedics;  Laterality: Left;    Subjective Symptoms/Limitations Symptoms: Pt states that she is having somesoreness in her L bice off and on. Pain Assessment Pain Score:   2 Pain Location: Arm (Bicep) Pain Orientation: Left   Assessment LUE AROM (degrees) LUE Overall AROM Comments: All measurments taken in standing position Left Shoulder Extension: 65 Degrees (was 65) Left Shoulder Flexion: 160 Degrees (was 155) Left Shoulder ABduction: 115 Degrees (was 105) Left Shoulder Internal Rotation: 80 Degrees (was 80) Left Shoulder External Rotation: 70 Degrees (was 62) LUE Strength Left Shoulder Flexion:  (4+/5 was 4/5 (limited by pain with resistance)) Left Shoulder Extension: 5/5 (was 5/5) Left Shoulder ABduction:  (4+/5 was 3+/5) Left Shoulder Internal Rotation: 5/5 (was 5/5) Left Shoulder External Rotation: 5/5 (was 5/5) Left Elbow Flexion: 5/5 (was 5/5) Left Elbow Extension: 5/5 (was 5/5)  UEFS 71/80 (was 56/80) Exercise/Treatments Standing Other Standing Exercises: 20# pan counter transfer x 4 ROM / Strengthening / Isometric Strengthening UBE (Upper Arm Bike): 8 min LUE only  40fwd/3bkwd   Physical Therapy Assessment and Plan PT Assessment and Plan Clinical Impression Statement: Pt has met all goals. Pt states that she is no longer limited by lack of ROM with ADL's. Pt is comfortable with D/C to complete HEP. PT Plan: Recommend D/C to HEP.    Goals Home Exercise Program Pt will Perform Home Exercise Program: Independently PT Short Term Goals Time to Complete Short Term Goals: 4 weeks PT Short Term Goal 1: Pt will improve L shoulder PROM: flexion 0-100; abduction 0-90; ER: 0-25; IR: 0-45 PT Short Term Goal 1 - Progress: Met PT Short Term Goal 2: Pt will present with decreased fascial restrcitions. PT Short Term Goal 2 - Progress: Met PT Short Term Goal 3: Pt will report pain less than 5/10 for 50% of her day to decrease dependence on pain medication.  PT Short Term Goal 3 - Progress: Met PT Short Term Goal 4: Pt will improve her scapular and shoulder stabalizer endurance and demonstrate 10 shoulder approximations x10 sec without cueing.  PT Short Term Goal 4 - Progress: Met PT Long Term Goals Time to Complete Long Term Goals: 12 weeks PT Long Term Goal 1: Pt will improve shoulder AROM to WNL in order to complete necessary ADL's. PT Long Term Goal 1 - Progress: Met PT Long Term Goal 2: Pt will improve shoulder strength to WNL and demonstrate lifting a 20lb pan from counter to transfer to another counter to simulate return to work activities.  PT Long Term Goal 2 - Progress: Met Long Term Goal 3: Pt will improve her UEFS score to 60/80 for improved percieved  functional ability.  Long Term Goal 3 Progress: Met  Problem List Patient Active Problem List  Diagnosis  . FIBROIDS, UTERUS  . DM  . HYPERLIPIDEMIA  . ANEMIA, IRON DEFICIENCY, MICROCYTIC  . GERD  . IMPINGEMENT SYNDROME  . RUPTURE ROTATOR CUFF  . Back pain  . Bursitis of shoulder, left  . Rotator cuff tear  . Biceps tendon rupture, proximal  . Rotator cuff tear, left  . S/P shoulder surgery   . Shoulder pain  . Shoulder stiffness  . Muscle weakness (generalized)    PT - End of Session Activity Tolerance: Patient tolerated treatment well General Behavior During Session: [facility] for tasks performed Cognition: [facility] for tasks performed   Seth Bake, PTA 03/07/2012, 2:29 PM  Physician Documentation Your signature is required to indicate approval of the treatment plan as stated above.  Please sign and either send electronically or make a copy of this report for your files and return this physician signed original.   Please mark one 1.__approve of plan  2. ___approve of plan with the following conditions.   ______________________________                                                          _____________________ Physician Signature                                                                                                             Date

## 2012-03-29 ENCOUNTER — Ambulatory Visit: Payer: BC Managed Care – PPO | Admitting: Orthopedic Surgery

## 2012-04-05 ENCOUNTER — Encounter: Payer: Self-pay | Admitting: Orthopedic Surgery

## 2012-04-05 ENCOUNTER — Ambulatory Visit (INDEPENDENT_AMBULATORY_CARE_PROVIDER_SITE_OTHER): Payer: BC Managed Care – PPO | Admitting: Orthopedic Surgery

## 2012-04-05 VITALS — BP 128/62 | Ht 59.0 in | Wt 141.0 lb

## 2012-04-05 DIAGNOSIS — M6530 Trigger finger, unspecified finger: Secondary | ICD-10-CM

## 2012-04-05 DIAGNOSIS — M65319 Trigger thumb, unspecified thumb: Secondary | ICD-10-CM

## 2012-04-05 NOTE — Patient Instructions (Addendum)
Work on strength with the rubber bands   Continue cane exercises   You have received a steroid shot. 15% of patients experience increased pain at the injection site with in the next 24 hours. This is best treated with ice and tylenol extra strength 2 tabs every 8 hours. If you are still having pain please call the office.   Trigger Finger Trigger finger (digital tendinitis and stenosing tenosynovitis) is a common disorder that causes an often painful catching of the fingers or thumb. It occurs as a clicking, snapping or locking of a finger in the palm of the hand. The reason for this is that there is a problem with the tendons which flex the fingers sliding smoothly through their sheaths. The cause of this may be inflammation of the tendon and sheath, or from a thickening or nodule in the tendon. The condition may occur in any finger or a couple fingers at the same time. The cause may be overuse while doing the same activity over and over again with your hands.   Tendons are the tough cords that connect the muscles to bones. Muscles and tendons are part of the system which allows your body to move. When muscles contract in the forearm on the palm side, they pull the tendons toward the elbow and cause the fingers and thumb to bend (flex) toward the palm. These are the flexor tendons. The tendons slide through a slippery smooth membrane (synovium) which is called the tendon sheath. The sheaths have areas of tough fibrous tissues surrounding them which hold the tendons close to the bone. These are called pulleys because they work like a pulley. The first pulley is in the palm of the hand near the crease which runs across your palm. If the area of the tendon thickening is near the pulley, the tendon cannot slide smoothly through the pulley and this causes the trigger finger. The finger may lock with the finger curled or suddenly straighten out with a snap. This is more common in patients with rheumatoid  arthritis and diabetes. Left untreated, the condition may get worse to the point where the finger becomes locked in flexion, like making a fist, or less commonly locked with the finger straightened out. DIAGNOSIS   Your caregiver will easily make this diagnosis on examination. TREATMENT    Splinting for 6 to 8 weeks of time may be helpful. Use the splints as your caregiver suggests.   Heat used for twenty minutes at least four times a day followed by ice packs for twenty minutes unless directed otherwise by your caregiver may be helpful. If you find either heat or cold seems to be making the problem worse, quit using them and ask your caregiver for directions.   Cortisone injections along with splinting may speed up recovery. Several injections may be required. Cortisone may give relief after one injection.   Only take over-the-counter or prescription medicines for pain, discomfort, or fever as directed by your caregiver.   Surgery is another treatment that may be used if conservative treatments using injection and splinting does not work. Surgery can be minor without incisions (a cut does not have to be made) and can be done with a needle through the skin. No stitches are needed and most patients may return to work the same day.   Other surgical choices involve an open procedure where the surgeon opens the hand through a small incision (cut) and cuts the pulley so the tendon can again slide smoothly. Your  hand will still work fine. This small operation requires stitches and the recovery will be a little longer and the incisions will need to be protected until completely healed. You may have to limit your activities for up to 6 months.   Occupational or hand therapy may be required if there is stiffness remaining in the finger.  RISKS AND COMPLICATIONS Complications are uncommon but some problems that may occur are:  Recurrence of the trigger finger. This does not mean that the surgery was not  well done. It simply means that you may have formed scar tissue following surgery that causes the problem to reoccur.   Infection which could ruin the results of the surgery and can result in a finger which is frozen and can not move normally.   Nerve injury is possible which could result in permanent numbness of one or more fingers.  CARE AFTER SURGERY  Elevate your hand above your heart and use ice as instructed.   Follow instructions regarding finger motion/exercise.   Keep the surgical wound dry for at least 48 hrs or longer if instructed.   Keep your follow-up appointments.   Return to work and normal activities as instructed.  SEEK IMMEDIATE MEDICAL CARE IF:   Your problems are getting worse or you do not obtain relief from the treatment. Document Released: 06/05/2004 Document Revised: 08/05/2011 Document Reviewed: 01/28/2009 [facility] Patient Information 2012 [place], [place].

## 2012-04-05 NOTE — Progress Notes (Signed)
Patient ID: TAHARA RUFFINI, female   DOB: 1946/01/13, 66 y.o.   MRN: [id number] Chief Complaint  Patient presents with  . Follow-up    recheck Left shoulder, DOS 10/29/11    BP 128/62  Ht 4\' 11"  (1.499 m)  Wt 141 lb (63.957 kg)  BMI 28.48 kg/m2  This is LEFT rotator cuff open repair this is #5.  Home therapy at this time.  Complaints of mild discomfort not taking any oral pain medicines at this time. Forward elevaion is 130 abduction is 100, active.  Strength is 4/5 in the supraspinatus. Neurovascular exam is intact.  The patient complains of triggering and pain in the RIGHT thumb  There is tenderness over the A1 pulley, clicking and popping with flexion of the IP joint. There is a Heberden's node at the IP joint as well, which is nontender.  Injected RIGHT trigger thumb Procedure note trigger finger injection  Diagnosis trigger finger Postop diagnosis trigger finger Procedure injection of trigger finger Finger injected RIGHT thumb Details of procedure: After verbal consent and timeout to confirm site the RIGHT thumb injection with 1 cc of 40 mg of Depo-Medrol and 1 cc of 1% lidocaine this was then repeated on the LEFT finger  The procedure was tolerated well without complication  Continue home exercise program with therapy and for strengthening cane exercises for range of motion.  Return in December for 9 month post rotator cuff followup

## 2012-08-08 ENCOUNTER — Ambulatory Visit (INDEPENDENT_AMBULATORY_CARE_PROVIDER_SITE_OTHER): Payer: BC Managed Care – PPO | Admitting: Orthopedic Surgery

## 2012-08-08 ENCOUNTER — Encounter: Payer: Self-pay | Admitting: Orthopedic Surgery

## 2012-08-08 VITALS — BP 128/74 | Ht 59.0 in | Wt 141.0 lb

## 2012-08-08 DIAGNOSIS — M654 Radial styloid tenosynovitis [de Quervain]: Secondary | ICD-10-CM | POA: Insufficient documentation

## 2012-08-08 DIAGNOSIS — S43429A Sprain of unspecified rotator cuff capsule, initial encounter: Secondary | ICD-10-CM

## 2012-08-08 DIAGNOSIS — M75100 Unspecified rotator cuff tear or rupture of unspecified shoulder, not specified as traumatic: Secondary | ICD-10-CM

## 2012-08-08 DIAGNOSIS — Z9889 Other specified postprocedural states: Secondary | ICD-10-CM

## 2012-08-08 NOTE — Progress Notes (Signed)
Patient ID: Ann Vasquez, female   DOB: 03-21-1946, 66 y.o.   MRN: [id number] Chief Complaint  Patient presents with  . Follow-up    4 month rechek left shoulder; DOS 11/10/2011    1. S/P shoulder surgery   2. Rotator cuff tear   3. De Quervain's disease (radial styloid tenosynovitis)     Left shoulder normal  Right thumb tenderness and pain along the 1st ext compartment  Pain with ulnar deviation and tenderness over the A1 pulley of the right index finger   She does not want anything done at this time so we will follow as needed

## 2012-08-08 NOTE — Patient Instructions (Addendum)
activities as tolerated 

## 2012-08-09 ENCOUNTER — Other Ambulatory Visit (HOSPITAL_COMMUNITY): Payer: Self-pay | Admitting: Family Medicine

## 2012-08-09 DIAGNOSIS — Z09 Encounter for follow-up examination after completed treatment for conditions other than malignant neoplasm: Secondary | ICD-10-CM

## 2012-08-25 ENCOUNTER — Ambulatory Visit (HOSPITAL_COMMUNITY): Admission: RE | Admit: 2012-08-25 | Payer: BC Managed Care – PPO | Source: Ambulatory Visit

## 2012-09-06 ENCOUNTER — Ambulatory Visit (HOSPITAL_COMMUNITY)
Admission: RE | Admit: 2012-09-06 | Discharge: 2012-09-06 | Disposition: A | Discharge status: Home / Self Care | Payer: BC Managed Care – PPO | Source: Ambulatory Visit | Attending: Family Medicine | Admitting: Family Medicine

## 2012-09-06 DIAGNOSIS — Z09 Encounter for follow-up examination after completed treatment for conditions other than malignant neoplasm: Secondary | ICD-10-CM | POA: Insufficient documentation

## 2012-09-06 DIAGNOSIS — N6489 Other specified disorders of breast: Secondary | ICD-10-CM | POA: Insufficient documentation

## 2012-10-05 ENCOUNTER — Emergency Department (HOSPITAL_COMMUNITY): Payer: BC Managed Care – PPO

## 2012-10-05 ENCOUNTER — Emergency Department (HOSPITAL_COMMUNITY)
Admission: EM | Admit: 2012-10-05 | Discharge: 2012-10-05 | Disposition: A | Discharge status: Home / Self Care | Payer: BC Managed Care – PPO | Attending: Emergency Medicine | Admitting: Emergency Medicine

## 2012-10-05 ENCOUNTER — Encounter (HOSPITAL_COMMUNITY): Payer: Self-pay | Admitting: *Deleted

## 2012-10-05 DIAGNOSIS — H538 Other visual disturbances: Secondary | ICD-10-CM | POA: Insufficient documentation

## 2012-10-05 DIAGNOSIS — R51 Headache: Secondary | ICD-10-CM | POA: Insufficient documentation

## 2012-10-05 DIAGNOSIS — R42 Dizziness and giddiness: Secondary | ICD-10-CM | POA: Insufficient documentation

## 2012-10-05 DIAGNOSIS — E119 Type 2 diabetes mellitus without complications: Secondary | ICD-10-CM | POA: Insufficient documentation

## 2012-10-05 DIAGNOSIS — E7800 Pure hypercholesterolemia, unspecified: Secondary | ICD-10-CM | POA: Insufficient documentation

## 2012-10-05 DIAGNOSIS — Z8739 Personal history of other diseases of the musculoskeletal system and connective tissue: Secondary | ICD-10-CM | POA: Insufficient documentation

## 2012-10-05 DIAGNOSIS — R110 Nausea: Secondary | ICD-10-CM | POA: Insufficient documentation

## 2012-10-05 DIAGNOSIS — Z79899 Other long term (current) drug therapy: Secondary | ICD-10-CM | POA: Insufficient documentation

## 2012-10-05 DIAGNOSIS — I10 Essential (primary) hypertension: Secondary | ICD-10-CM | POA: Insufficient documentation

## 2012-10-05 LAB — CBC WITH DIFFERENTIAL/PLATELET
Basophils Relative: 0 % (ref 0–1)
Eosinophils Absolute: 0.1 10*3/uL (ref 0.0–0.7)
Eosinophils Relative: 1 % (ref 0–5)
Hemoglobin: 11.2 g/dL — ABNORMAL LOW (ref 12.0–15.0)
Lymphocytes Relative: 37 % (ref 12–46)
Neutrophils Relative %: 56 % (ref 43–77)
Platelets: 203 10*3/uL (ref 150–400)
RBC: 4.77 MIL/uL (ref 3.87–5.11)

## 2012-10-05 LAB — BASIC METABOLIC PANEL
CO2: 26 mEq/L (ref 19–32)
Calcium: 10.1 mg/dL (ref 8.4–10.5)
GFR calc non Af Amer: >90 mL/min (ref >90)
Glucose, Bld: 109 mg/dL — ABNORMAL HIGH (ref 70–99)
Potassium: 4.5 mEq/L (ref 3.5–5.1)
Sodium: 138 mEq/L (ref 135–145)

## 2012-10-05 LAB — URINALYSIS, ROUTINE W REFLEX MICROSCOPIC
Bilirubin Urine: NEGATIVE
Nitrite: NEGATIVE
Protein, ur: NEGATIVE mg/dL
Specific Gravity, Urine: 1.015 (ref 1.005–1.030)
Urobilinogen, UA: 0.2 mg/dL (ref 0.0–1.0)

## 2012-10-05 MED ORDER — MECLIZINE HCL 25 MG PO TABS: 25.0000 mg | ORAL_TABLET | Freq: Four times a day (QID) | ORAL | Status: DC

## 2012-10-05 MED ORDER — SODIUM CHLORIDE 0.9 % IV SOLN
INTRAVENOUS | Status: DC
  Administered 2012-10-05: 19:00:00 via INTRAVENOUS

## 2012-10-05 MED ORDER — SODIUM CHLORIDE 0.9 % IV BOLUS (SEPSIS)
500.0000 mL | Freq: Once | INTRAVENOUS | Status: AC
  Administered 2012-10-05: 1000 mL via INTRAVENOUS

## 2012-10-05 NOTE — ED Notes (Signed)
Pt to xray

## 2012-10-05 NOTE — ED Notes (Signed)
Pt alert & oriented x4, stable gait. Patient given discharge instructions, paperwork & prescription(s). Patient  instructed to stop at the registration desk to finish any additional paperwork. Patient verbalized understanding. Pt left department w/ no further questions. 

## 2012-10-05 NOTE — ED Provider Notes (Signed)
History  This chart was scribed for Shelda Jakes, MD by Bennett Scrape, ED Scribe. This patient was seen in room APA16A/[facility]16A and the patient's care was started at 3:52 PM.  CSN: [id number]  Arrival date & time 10/05/12  1534   First MD Initiated Contact with Patient 10/05/12 1552      Chief Complaint  Patient presents with  . Shortness of Breath     Patient is a 67 y.o. female presenting with shortness of breath. The history is provided by the patient. No language interpreter was used.  Shortness of Breath  The current episode started 2 days ago. The onset was gradual. The problem occurs frequently. The problem has been gradually worsening. The problem is mild. Associated symptoms include shortness of breath. Pertinent negatives include no chest pain, no sore throat and no cough. She was not exposed to toxic fumes. She has not inhaled smoke recently. She has had no prior hospitalizations. She has had no prior ICU admissions. She has had no prior intubations. Her past medical history does not include asthma.    KASIDY GIANINO is a 67 y.o. female who presents to the Emergency Department complaining of 2 days of gradual onset, gradually worsening, new SOB with exertion with associated nausea, intermittent dizziness described as room spinning, bilateral temple HA right greater than left described as pressure and 5 days of blurred vision. She was seen at Urgent Care for the same and sent here for further evaluation. She denies modifying factors for the dizziness and denies having prior episodes of similar symptoms. She states that she had her BP checked high at work today but she denies checking her BP yesterday or the day before. BP was 174/61 in the ED. She denies taking any new medications or having recent changes. She denies associated congestion, cough, fever and CP. She also has a h/o DM and HLD and denies smoking and alcohol use.  Past Medical History  Diagnosis Date  . HTN  (hypertension)   . High cholesterol   . Diabetes mellitus type 2, diet-controlled   . DJD (degenerative joint disease) of cervical spine     Past Surgical History  Procedure Date  . Right shoulder 2011    rotator cuff repair, Romeo Apple, [facility]  . Shoulder open rotator cuff repair 10/29/2011    Procedure: left ROTATOR CUFF REPAIR SHOULDER OPEN;  Surgeon: Fuller Canada, MD;  Location: [facility];  Service: Orthopedics;  Laterality: Left;    Family History  Problem Relation Age of Onset  . Anesthesia problems Neg Hx   . Malignant hyperthermia Neg Hx   . Hypotension Neg Hx   . Pseudochol deficiency Neg Hx     History  Substance Use Topics  . Smoking status: Never Smoker   . Smokeless tobacco: Not on file  . Alcohol Use: No    No OB history provided.  Review of Systems  Constitutional: Negative for chills.  HENT: Negative for congestion, sore throat and neck pain.   Eyes: Positive for visual disturbance.  Respiratory: Positive for shortness of breath. Negative for cough.   Cardiovascular: Negative for chest pain.  Gastrointestinal: Positive for nausea. Negative for vomiting, abdominal pain and diarrhea.  Genitourinary: Negative for dysuria, vaginal bleeding and vaginal discharge.  Musculoskeletal: Negative for myalgias and back pain.  Skin: Negative for rash.  Neurological: Positive for dizziness and headaches. Negative for syncope.  All other systems reviewed and are negative.    Allergies  Review of patient's allergies indicates no  known allergies.  Home Medications   Current Outpatient Rx  Name  Route  Sig  Dispense  Refill  . HYDROCODONE-ACETAMINOPHEN 5-325 MG PO TABS   Oral   Take 1 tablet by mouth every 6 (six) hours as needed. Pain         . LISINOPRIL 20 MG PO TABS   Oral   Take 20 mg by mouth daily.         Marland Kitchen ROSUVASTATIN CALCIUM 10 MG PO TABS   Oral   Take 10 mg by mouth at bedtime.            Triage Vitals: BP 174/61  Pulse 55  Temp 97.7 F  (36.5 C) (Oral)  Resp 18  Ht 4\' 11"  (1.499 m)  Wt 138 lb (62.596 kg)  BMI 27.87 kg/m2  SpO2 100%  Physical Exam  Nursing note and vitals reviewed. Constitutional: She is oriented to person, place, and time. She appears well-developed and well-nourished. No distress.  HENT:  Head: Normocephalic and atraumatic.  Mouth/Throat: Oropharynx is clear and moist.  Eyes: Conjunctivae normal and EOM are normal. Pupils are equal, round, and reactive to light.  Neck: Neck supple. No tracheal deviation present.  Cardiovascular: Regular rhythm and normal heart sounds.  Bradycardia present.   Pulmonary/Chest: Effort normal and breath sounds normal. No respiratory distress.  Abdominal: Soft. Bowel sounds are normal. There is no tenderness.  Musculoskeletal: Normal range of motion. She exhibits no edema.  Lymphadenopathy:    She has no cervical adenopathy.  Neurological: She is alert and oriented to person, place, and time. No cranial nerve deficit.       No pronator's drift, no nystagmus,   Skin: Skin is warm and dry.  Psychiatric: She has a normal mood and affect. Her behavior is normal.    ED Course  Procedures (including critical care time)  DIAGNOSTIC STUDIES: Oxygen Saturation is 100% on room air, normal by my interpretation.    COORDINATION OF CARE: 4:04 PM- BP is 175/66 currently. Discussed treatment plan which includes CXR, CT of head, CBC panel and UA with pt at bedside and pt agreed to plan.   4:15 PM-Ordered 500 mL of bolus  7:01 PM- Pt rechecked and informed of radiology and lab work. Discussed discharge plan with pt and pt agreed to plan. Also advised pt to follow up and pt agreed.  Labs Reviewed  CBC WITH DIFFERENTIAL - Abnormal; Notable for the following:    Hemoglobin 11.2 (*)     HCT 35.3 (*)     MCV 74.0 (*)     MCH 23.5 (*)     All other components within normal limits  BASIC METABOLIC PANEL - Abnormal; Notable for the following:    Glucose, Bld 109 (*)     All  other components within normal limits  URINALYSIS, ROUTINE W REFLEX MICROSCOPIC - Abnormal; Notable for the following:    Leukocytes, UA SMALL (*)     All other components within normal limits  URINE MICROSCOPIC-ADD ON   Dg Chest 2 View  10/05/2012  *RADIOLOGY REPORT*  Clinical Data: Shortness of breath, hypertension  CHEST - 2 VIEW  Comparison: 12/22/2011  Findings: Lungs are essentially clear.  Stable left lower lobe scarring. No pleural effusion or pneumothorax.  The heart is top normal in size.  Stable prominence of the bilateral pulmonary hila, likely vascular.  Visualized osseous structures are within normal limits.  IMPRESSION: No evidence of acute cardiopulmonary disease.   Original Report Authenticated  By: Charline Bills, M.D.    Ct Head Wo Contrast  10/05/2012  *RADIOLOGY REPORT*  Clinical Data: High blood pressure. Shortness of breath.  Slight temporal headache.  Blurred vision.  Dizziness.  Nausea.  Diabetic hypertensive patient with hyperlipidemia.  CT HEAD WITHOUT CONTRAST  Technique:  Contiguous axial images were obtained from the base of the skull through the vertex without contrast.  Comparison: 12/22/2011 MR.  05/25/2010 CT.  Findings: No intracranial hemorrhage.  Nonspecific white matter type changes right posterior frontal - parietal lobe without change when compared to prior examination. No CT evidence of large acute infarct.  No intracranial mass lesion detected on this unenhanced exam.  No hydrocephalus.  Slight asymmetry of the sella region unchanged.  Visualized orbital structures unremarkable.  Mastoid air cells, middle ear cavities and visualized sinuses are clear.  IMPRESSION: No acute abnormality.  Please see above.   Original Report Authenticated By: Lacy Duverney, M.D.      Date: 10/05/2012  Rate: 48  Rhythm: sinus bradycardia  QRS Axis: normal  Intervals: normal  ST/T Wave abnormalities: normal  Conduction Disutrbances:none  Narrative Interpretation:   Old EKG  Reviewed: changes noted Huston Foley new since 12/22/11   1. Hypertension   2. Dizziness       MDM   Patient improved significantly in emergency apartment. Patient's blood pressure is now normal without any treatment. No additional vertigo. However we will treat with Antivert. Patient's head CT was negative EKG was negative labs without any significant findings. Patient will follow with primary care Dr. Clelia Croft return for any new or worse symptoms to include any strokelike symptoms if so she will need an MRI.    I personally performed the services described in this documentation, which was scribed in my presence. The recorded information has been reviewed and is accurate.         Shelda Jakes, MD 10/05/12 [phone]

## 2012-10-05 NOTE — ED Notes (Signed)
Sent from Urgent Care for eval, of dizziness, sob , blurred vision and hypertension  No chest pain

## 2012-12-31 ENCOUNTER — Encounter (HOSPITAL_COMMUNITY): Payer: Self-pay

## 2012-12-31 ENCOUNTER — Emergency Department (HOSPITAL_COMMUNITY)
Admission: EM | Admit: 2012-12-31 | Discharge: 2012-12-31 | Disposition: A | Discharge status: Home / Self Care | Payer: BC Managed Care – PPO | Attending: Emergency Medicine | Admitting: Emergency Medicine

## 2012-12-31 DIAGNOSIS — K089 Disorder of teeth and supporting structures, unspecified: Secondary | ICD-10-CM | POA: Insufficient documentation

## 2012-12-31 DIAGNOSIS — H9209 Otalgia, unspecified ear: Secondary | ICD-10-CM | POA: Insufficient documentation

## 2012-12-31 DIAGNOSIS — E119 Type 2 diabetes mellitus without complications: Secondary | ICD-10-CM | POA: Insufficient documentation

## 2012-12-31 DIAGNOSIS — Z8739 Personal history of other diseases of the musculoskeletal system and connective tissue: Secondary | ICD-10-CM | POA: Insufficient documentation

## 2012-12-31 DIAGNOSIS — J3489 Other specified disorders of nose and nasal sinuses: Secondary | ICD-10-CM | POA: Insufficient documentation

## 2012-12-31 DIAGNOSIS — J029 Acute pharyngitis, unspecified: Secondary | ICD-10-CM | POA: Insufficient documentation

## 2012-12-31 DIAGNOSIS — I10 Essential (primary) hypertension: Secondary | ICD-10-CM | POA: Insufficient documentation

## 2012-12-31 DIAGNOSIS — R05 Cough: Secondary | ICD-10-CM | POA: Insufficient documentation

## 2012-12-31 DIAGNOSIS — R059 Cough, unspecified: Secondary | ICD-10-CM | POA: Insufficient documentation

## 2012-12-31 DIAGNOSIS — E7800 Pure hypercholesterolemia, unspecified: Secondary | ICD-10-CM | POA: Insufficient documentation

## 2012-12-31 DIAGNOSIS — J329 Chronic sinusitis, unspecified: Secondary | ICD-10-CM | POA: Insufficient documentation

## 2012-12-31 DIAGNOSIS — Z79899 Other long term (current) drug therapy: Secondary | ICD-10-CM | POA: Insufficient documentation

## 2012-12-31 MED ORDER — AZITHROMYCIN 250 MG PO TABS: 250.0000 mg | ORAL_TABLET | Freq: Every day | ORAL | Status: DC

## 2012-12-31 MED ORDER — OXYMETAZOLINE HCL 0.05 % NA SOLN: 2.0000 | Freq: Two times a day (BID) | NASAL | Status: DC

## 2012-12-31 MED ORDER — AZITHROMYCIN 250 MG PO TABS
500.0000 mg | ORAL_TABLET | Freq: Once | ORAL | Status: AC
  Administered 2012-12-31: 500 mg via ORAL
  Filled 2012-12-31: qty 2

## 2012-12-31 MED ORDER — CETIRIZINE HCL 10 MG PO TABS: 10.0000 mg | ORAL_TABLET | Freq: Every day | ORAL | Status: DC

## 2012-12-31 NOTE — ED Provider Notes (Signed)
History  This chart was scribed for Ann Roller, MD by Shari Heritage, ED Scribe. The patient was seen in room APA14/APA14. Patient's care was started at 0828.   CSN: [id number]  Arrival date & time 12/31/12  0825   First MD Initiated Contact with Patient 12/31/12 [phone]      Chief Complaint  Patient presents with  . Facial Pain     The history is provided by the patient. No language interpreter was used.    HPI Comments: Ann Vasquez is a 67 y.o. female with history of DM II, hypertension, high cholesterol and DJD of cervical spine who presents to the Emergency Department complaining of diffuse, constant headache and facial pain that began yesterday. Patient says that she has been feeling ill since Wednesday. Four days ago, she began having nasal congestion, occasional productive cough and sore throat. She reports that yesterday she began feeling worse after developing facial pain, headache, dental pain and right ear pain. She states that these persistent symptoms prompted her to come into the ED for evaluation. She has not noticed a fever at home, but temperature at triage as 99.3.  She states that pain is worsened with chewing. Temperature at triage was 99.3. Patient denies any other symptoms at this time. Patient denies any history of heart disease. She does not smoke or use alcohol.    Past Medical History  Diagnosis Date  . HTN (hypertension)   . High cholesterol   . Diabetes mellitus type 2, diet-controlled   . DJD (degenerative joint disease) of cervical spine     Past Surgical History  Procedure Laterality Date  . Right shoulder  2011    rotator cuff repair, Romeo Apple, [facility]  . Shoulder open rotator cuff repair  10/29/2011    Procedure: left ROTATOR CUFF REPAIR SHOULDER OPEN;  Surgeon: Fuller Canada, MD;  Location: [facility];  Service: Orthopedics;  Laterality: Left;    Family History  Problem Relation Age of Onset  . Anesthesia problems Neg Hx   . Malignant  hyperthermia Neg Hx   . Hypotension Neg Hx   . Pseudochol deficiency Neg Hx     History  Substance Use Topics  . Smoking status: Never Smoker   . Smokeless tobacco: Not on file  . Alcohol Use: No    OB History   Grav Para Term Preterm Abortions TAB SAB Ect Mult Living                  Review of Systems A complete 10 system review of systems was obtained and all systems are negative except as noted in the HPI and PMH.   Allergies  Review of patient's allergies indicates no known allergies.  Home Medications   Current Outpatient Rx  Name  Route  Sig  Dispense  Refill  . lisinopril (PRINIVIL,ZESTRIL) 20 MG tablet   Oral   Take 20 mg by mouth daily.         . meclizine (ANTIVERT) 25 MG tablet   Oral   Take 1 tablet (25 mg total) by mouth 4 (four) times daily.   28 tablet   0   . rosuvastatin (CRESTOR) 10 MG tablet   Oral   Take 10 mg by mouth at bedtime.          Marland Kitchen azithromycin (ZITHROMAX Z-PAK) 250 MG tablet   Oral   Take 1 tablet (250 mg total) by mouth daily. 500mg  PO day 1, then 250mg  PO days 205  6 tablet   0   . cetirizine (ZYRTEC ALLERGY) 10 MG tablet   Oral   Take 1 tablet (10 mg total) by mouth daily.   30 tablet   1   . oxymetazoline (AFRIN NASAL SPRAY) 0.05 % nasal spray   Nasal   Place 2 sprays into the nose 2 (two) times daily.   30 mL   0     Triage Vitals: BP 173/80  Pulse 72  Temp(Src) 99.3 F (37.4 C) (Oral)  Resp 20  Ht 4\' 11"  (1.499 m)  Wt 139 lb (63.05 kg)  BMI 28.06 kg/m2  SpO2 98%  Physical Exam  Constitutional: She is oriented to person, place, and time. She appears well-developed and well-nourished.  HENT:  Head: Normocephalic and atraumatic.  Right Ear: Tympanic membrane, external ear and ear canal normal. Right ear middle ear effusion: slight, clear.  Left Ear: Tympanic membrane, external ear and ear canal normal.  Nose: Right sinus exhibits maxillary sinus tenderness. Left sinus exhibits maxillary sinus  tenderness.  Mouth/Throat: Uvula is midline, oropharynx is clear and moist and mucous membranes are normal. No oropharyngeal exudate, posterior oropharyngeal edema or posterior oropharyngeal erythema.  Swollen turbinates bilaterally.   Eyes: Conjunctivae and EOM are normal. Pupils are equal, round, and reactive to light.  Neck: Normal range of motion. Neck supple.  Cardiovascular: Normal rate, regular rhythm and normal heart sounds.   No murmur heard. Pulmonary/Chest: Effort normal and breath sounds normal. No respiratory distress. She has no wheezes. She has no rales.  Abdominal: Soft. She exhibits no distension and no mass. There is no tenderness. There is no rebound and no guarding.  Musculoskeletal: Normal range of motion.  Lymphadenopathy:    She has no cervical adenopathy.  Neurological: She is alert and oriented to person, place, and time.  Skin: Skin is warm and dry. No rash noted.    ED Course  Procedures (including critical care time) DIAGNOSTIC STUDIES: Oxygen Saturation is 98% on room air, normal by my interpretation.    COORDINATION OF CARE: 8:31 AM- Patient informed of current plan for treatment and evaluation and agrees with plan at this time.      Labs Reviewed - No data to display No results found.   1. Sinusitis       MDM  Pt overall appears well, there is sinus ttp and sx c/w sinusitis.  She is tolerating PO and will be started on combo of meds to help with sinusitis.  I personally performed the services described in this documentation, which was scribed in my presence. The recorded information has been reviewed and is accurate.      Ann Roller, MD 12/31/12 2120

## 2012-12-31 NOTE — ED Notes (Signed)
Pt c/o headache and facial pain since yesterday.  Also c/o r earache and sore throat.  Reports cough, productive at times.  Denies fever.

## 2013-01-02 ENCOUNTER — Ambulatory Visit (HOSPITAL_COMMUNITY)
Admission: RE | Admit: 2013-01-02 | Discharge: 2013-01-02 | Disposition: A | Discharge status: Home / Self Care | Payer: BC Managed Care – PPO | Source: Ambulatory Visit | Attending: Internal Medicine | Admitting: Internal Medicine

## 2013-01-02 ENCOUNTER — Other Ambulatory Visit (HOSPITAL_COMMUNITY): Payer: Self-pay | Admitting: Internal Medicine

## 2013-01-02 ENCOUNTER — Encounter (HOSPITAL_COMMUNITY): Payer: Self-pay

## 2013-01-02 DIAGNOSIS — R51 Headache: Secondary | ICD-10-CM

## 2013-01-02 DIAGNOSIS — I10 Essential (primary) hypertension: Secondary | ICD-10-CM | POA: Insufficient documentation

## 2013-01-02 DIAGNOSIS — Z0000 Encounter for general adult medical examination without abnormal findings: Secondary | ICD-10-CM

## 2013-01-02 DIAGNOSIS — E119 Type 2 diabetes mellitus without complications: Secondary | ICD-10-CM | POA: Insufficient documentation

## 2013-01-04 ENCOUNTER — Ambulatory Visit (HOSPITAL_COMMUNITY)
Admission: RE | Admit: 2013-01-04 | Discharge: 2013-01-04 | Disposition: A | Discharge status: Home / Self Care | Payer: BC Managed Care – PPO | Source: Ambulatory Visit | Attending: Internal Medicine | Admitting: Internal Medicine

## 2013-01-04 DIAGNOSIS — Z780 Asymptomatic menopausal state: Secondary | ICD-10-CM | POA: Insufficient documentation

## 2013-01-04 DIAGNOSIS — M818 Other osteoporosis without current pathological fracture: Secondary | ICD-10-CM | POA: Insufficient documentation

## 2013-01-04 DIAGNOSIS — Z0000 Encounter for general adult medical examination without abnormal findings: Secondary | ICD-10-CM

## 2013-01-17 ENCOUNTER — Encounter: Payer: Self-pay | Admitting: *Deleted

## 2013-01-25 ENCOUNTER — Other Ambulatory Visit: Payer: Self-pay | Admitting: Obstetrics and Gynecology

## 2013-03-10 ENCOUNTER — Emergency Department (HOSPITAL_COMMUNITY)
Admission: EM | Admit: 2013-03-10 | Discharge: 2013-03-10 | Disposition: A | Discharge status: Home / Self Care | Payer: BC Managed Care – PPO | Attending: Emergency Medicine | Admitting: Emergency Medicine

## 2013-03-10 ENCOUNTER — Encounter (HOSPITAL_COMMUNITY): Payer: Self-pay

## 2013-03-10 ENCOUNTER — Emergency Department (HOSPITAL_COMMUNITY): Payer: BC Managed Care – PPO

## 2013-03-10 DIAGNOSIS — Z79899 Other long term (current) drug therapy: Secondary | ICD-10-CM | POA: Insufficient documentation

## 2013-03-10 DIAGNOSIS — Z862 Personal history of diseases of the blood and blood-forming organs and certain disorders involving the immune mechanism: Secondary | ICD-10-CM | POA: Insufficient documentation

## 2013-03-10 DIAGNOSIS — Z8739 Personal history of other diseases of the musculoskeletal system and connective tissue: Secondary | ICD-10-CM | POA: Insufficient documentation

## 2013-03-10 DIAGNOSIS — E785 Hyperlipidemia, unspecified: Secondary | ICD-10-CM | POA: Insufficient documentation

## 2013-03-10 DIAGNOSIS — I10 Essential (primary) hypertension: Secondary | ICD-10-CM | POA: Insufficient documentation

## 2013-03-10 DIAGNOSIS — E7800 Pure hypercholesterolemia, unspecified: Secondary | ICD-10-CM | POA: Insufficient documentation

## 2013-03-10 DIAGNOSIS — Z872 Personal history of diseases of the skin and subcutaneous tissue: Secondary | ICD-10-CM | POA: Insufficient documentation

## 2013-03-10 DIAGNOSIS — E119 Type 2 diabetes mellitus without complications: Secondary | ICD-10-CM | POA: Insufficient documentation

## 2013-03-10 DIAGNOSIS — R011 Cardiac murmur, unspecified: Secondary | ICD-10-CM | POA: Insufficient documentation

## 2013-03-10 DIAGNOSIS — M545 Low back pain, unspecified: Secondary | ICD-10-CM | POA: Insufficient documentation

## 2013-03-10 DIAGNOSIS — M533 Sacrococcygeal disorders, not elsewhere classified: Secondary | ICD-10-CM | POA: Insufficient documentation

## 2013-03-10 LAB — URINALYSIS, ROUTINE W REFLEX MICROSCOPIC
Bilirubin Urine: NEGATIVE
Glucose, UA: NEGATIVE mg/dL
Hgb urine dipstick: NEGATIVE
Ketones, ur: NEGATIVE mg/dL
Leukocytes, UA: NEGATIVE
Protein, ur: NEGATIVE mg/dL
pH: 6 (ref 5.0–8.0)

## 2013-03-10 MED ORDER — HYDROCODONE-ACETAMINOPHEN 5-325 MG PO TABS: 1.0000 | ORAL_TABLET | ORAL | Status: DC | PRN

## 2013-03-10 MED ORDER — OXYCODONE-ACETAMINOPHEN 5-325 MG PO TABS
ORAL_TABLET | ORAL | Status: AC
  Filled 2013-03-10: qty 1

## 2013-03-10 NOTE — ED Notes (Addendum)
Pt states that she started having lower back pain Wednesday night, states it has gotten worse since then.  States that she tried heat and ibuprofen for the pain every 6-8 hours (400 mg), which did help.  States the pain is worse in the mornings and eases through the day with movement, denies injury.  Denies urinary symptoms.  States that the pain occasionally radiates down her right leg.  Points to lumbar-sacral region when asked to identify area of pain.

## 2013-03-10 NOTE — ED Provider Notes (Signed)
History    CSN: [id number] Arrival date & time 03/10/13  1016  First MD Initiated Contact with Patient 03/10/13 1024     Chief Complaint  Patient presents with  . Back Pain   (Consider location/radiation/quality/duration/timing/severity/associated sxs/prior Treatment) HPI Comments: JULEE STOLL is a 67 y.o. Female presenting with low back and sacral pain without trauma which started gradually at rest 3 evenings ago with progressive worsening.  She had radiation of pain into her right lower buttock/upper thigh area yesterday which is better.  Pain is worse when she first wakes in the morning,  Gets somewhat better after she has been up awhile.  She has tried heating pad and ibuprofen, taking 400mg  every 6-8 hours with transient relief.  She denies urinary or fecal retention or incontinence, has had no fevers , chills, dizziness, abdominal pain or distention,  chest pain or shortness of breath.     The history is provided by the patient.   Past Medical History  Diagnosis Date  . HTN (hypertension)   . High cholesterol   . Diabetes mellitus type 2, diet-controlled   . Hyperlipidemia   . Frequent headaches   . Cellulitis and abscess of leg     extends to foot  . DJD (degenerative joint disease) of cervical spine   . Anemia   . Heart murmur    Past Surgical History  Procedure Laterality Date  . Right shoulder  2011    rotator cuff repair, Romeo Apple, [facility]  . Shoulder open rotator cuff repair  10/29/2011    Procedure: left ROTATOR CUFF REPAIR SHOULDER OPEN;  Surgeon: Fuller Canada, MD;  Location: [facility];  Service: Orthopedics;  Laterality: Left;   Family History  Problem Relation Age of Onset  . Anesthesia problems Neg Hx   . Malignant hyperthermia Neg Hx   . Hypotension Neg Hx   . Pseudochol deficiency Neg Hx    History  Substance Use Topics  . Smoking status: Never Smoker   . Smokeless tobacco: Not on file  . Alcohol Use: No   OB History   Grav Para Term  Preterm Abortions TAB SAB Ect Mult Living                 Review of Systems  Constitutional: Negative for fever.  Respiratory: Negative for shortness of breath.   Cardiovascular: Negative for chest pain and leg swelling.  Gastrointestinal: Negative for abdominal pain, constipation and abdominal distention.  Genitourinary: Negative for dysuria, urgency, frequency, flank pain and difficulty urinating.  Musculoskeletal: Positive for back pain. Negative for joint swelling and gait problem.  Skin: Negative for rash.  Neurological: Negative for weakness and numbness.    Allergies  Review of patient's allergies indicates no known allergies.  Home Medications   Current Outpatient Rx  Name  Route  Sig  Dispense  Refill  . ibuprofen (ADVIL,MOTRIN) 200 MG tablet   Oral   Take 400 mg by mouth every 6 (six) hours as needed for pain.         Marland Kitchen lisinopril (PRINIVIL,ZESTRIL) 20 MG tablet   Oral   Take 20 mg by mouth daily.         . rosuvastatin (CRESTOR) 10 MG tablet   Oral   Take 10 mg by mouth at bedtime.           BP 169/64  Pulse 65  Temp(Src) 98.7 F (37.1 C) (Oral)  Resp 20  Ht 4\' 11"  (1.499 m)  Wt 139 lb (  63.05 kg)  BMI 28.06 kg/m2  SpO2 100% Physical Exam  Nursing note and vitals reviewed. Constitutional: She appears well-developed and well-nourished.  HENT:  Head: Normocephalic.  Eyes: Conjunctivae are normal.  Neck: Normal range of motion. Neck supple.  Cardiovascular: Normal rate and intact distal pulses.   Pedal pulses normal.  Pulmonary/Chest: Effort normal.  Abdominal: Soft. Bowel sounds are normal. She exhibits no distension, no abdominal bruit, no pulsatile midline mass and no mass. There is no tenderness. There is no guarding.  Musculoskeletal: Normal range of motion. She exhibits no edema.       Lumbar back: She exhibits tenderness and bony tenderness. She exhibits no swelling, no edema and no spasm.  ttp midline ls spine and sacrum.    Neurological:  She is alert. She has normal strength. She displays no atrophy and no tremor. No sensory deficit. Gait normal.  Reflex Scores:      Patellar reflexes are 2+ on the right side and 2+ on the left side.      Achilles reflexes are 2+ on the right side and 2+ on the left side. No strength deficit noted in hip and knee flexor and extensor muscle groups.  Ankle flexion and extension intact.  Skin: Skin is warm and dry.  Psychiatric: She has a normal mood and affect.    ED Course  Procedures (including critical care time) Labs Reviewed  URINALYSIS, ROUTINE W REFLEX MICROSCOPIC   No results found. No diagnosis found.  MDM  Patients labs and/or radiological studies were viewed and considered during the medical decision making and disposition process. Hydrocodone prescribed,  Heat tx recommended,  F/u with pcp if not improved this week.  No neuro deficit on exam or by history to suggest emergent or surgical presentation.  Also discussed worsened sx that should prompt immediate re-evaluation including distal weakness, bowel/bladder retention/incontinence.        Burgess Amor, PA-C 03/10/13 1431

## 2013-03-10 NOTE — ED Notes (Signed)
EDP at bedside discussing plan of care and further possible follow up needed. Pt verbalized understanding.

## 2013-03-10 NOTE — ED Notes (Addendum)
Pt reports low back pain for 3 days denies any known injury, no urinary s/s, pt tried heat and ibu --both helped her back pain.

## 2013-03-10 NOTE — ED Provider Notes (Signed)
Medical screening examination/treatment/procedure(s) were performed by non-physician practitioner and as supervising physician I was immediately available for consultation/collaboration.   Joya Gaskins, MD 03/10/13 [id number]

## 2013-03-29 ENCOUNTER — Other Ambulatory Visit: Payer: Self-pay | Admitting: Obstetrics and Gynecology

## 2013-03-30 ENCOUNTER — Other Ambulatory Visit (HOSPITAL_COMMUNITY)
Admission: RE | Admit: 2013-03-30 | Discharge: 2013-03-30 | Disposition: A | Discharge status: Home / Self Care | Payer: BC Managed Care – PPO | Source: Ambulatory Visit | Attending: Obstetrics and Gynecology | Admitting: Obstetrics and Gynecology

## 2013-03-30 ENCOUNTER — Encounter: Payer: Self-pay | Admitting: Obstetrics and Gynecology

## 2013-03-30 ENCOUNTER — Ambulatory Visit (INDEPENDENT_AMBULATORY_CARE_PROVIDER_SITE_OTHER): Payer: BC Managed Care – PPO | Admitting: Obstetrics and Gynecology

## 2013-03-30 VITALS — BP 120/60 | Ht <= 58 in | Wt 137.4 lb

## 2013-03-30 DIAGNOSIS — Z01419 Encounter for gynecological examination (general) (routine) without abnormal findings: Secondary | ICD-10-CM

## 2013-03-30 DIAGNOSIS — Z1212 Encounter for screening for malignant neoplasm of rectum: Secondary | ICD-10-CM

## 2013-03-30 DIAGNOSIS — Z1151 Encounter for screening for human papillomavirus (HPV): Secondary | ICD-10-CM | POA: Insufficient documentation

## 2013-03-30 LAB — HEMOCCULT GUIAC POC 1CARD (OFFICE)

## 2013-03-30 NOTE — Progress Notes (Signed)
  Assessment:  Annual Gyn Exam   Plan:  1. pap smear done, next pap due 44yr,   2. return annually or prn 3    Annual mammogram advised Subjective:  Ann Vasquez is a 67 y.o. female No obstetric history on file. who presents for annual exam. No LMP recorded. Patient is postmenopausal. The patient has complaints today of dull ache suprapubic. No dysuria. Not sexually active.  The following portions of the patient's history were reviewed and updated as appropriate: allergies, current medications, past family history, past medical history, past social history, past surgical history and problem list.  Review of Systems Constitutional: negative Gastrointestinal: negative Genitourinary: no urgency, nocturia x 0-1  Objective:  BP 120/60  Ht 4\' 10"  (1.473 m)  Wt 137 lb 6.4 oz (62.324 kg)  BMI 28.72 kg/m2   BMI: Body mass index is 28.72 kg/(m^2).  General Appearance: Alert, appropriate appearance for age. No acute distress HEENT: Grossly normal Neck / Thyroid:  Cardiovascular: RRR; normal S1, S2, no murmur Lungs: CTA bilaterally Back: No CVAT Breast Exam: No dimpling, nipple retraction or discharge. No masses or nodes. and No masses or nodes.No dimpling, nipple retraction or discharge. Gastrointestinal: Soft, non-tender, no masses or organomegaly Pelvic Exam: Vulva and vagina appear normal. Bimanual exam reveals normal uterus and adnexa. Rectovaginal: guaiac negative stool obtained, no rectocele and sphincter tone normal Lymphatic Exam: Non-palpable nodes in neck, clavicular, axillary, or inguinal regions Skin: no rash or abnormalities Neurologic: Normal gait and speech, no tremor  Psychiatric: Alert and oriented, appropriate affect.  Urinalysis:Not done  Christin Bach. MD Pgr [phone] 12:19 PM    Assessment:  Annual Gyn Exam

## 2013-03-30 NOTE — Patient Instructions (Signed)
paps every 3 yr adequate. Rechk q 3 yr. With primary care doing breast chk and rectal/hemoccult.

## 2013-08-31 DIAGNOSIS — E785 Hyperlipidemia, unspecified: Secondary | ICD-10-CM | POA: Diagnosis not present

## 2013-08-31 DIAGNOSIS — Z6829 Body mass index (BMI) 29.0-29.9, adult: Secondary | ICD-10-CM | POA: Diagnosis not present

## 2013-08-31 DIAGNOSIS — I10 Essential (primary) hypertension: Secondary | ICD-10-CM | POA: Diagnosis not present

## 2013-08-31 DIAGNOSIS — IMO0001 Reserved for inherently not codable concepts without codable children: Secondary | ICD-10-CM | POA: Diagnosis not present

## 2013-10-09 ENCOUNTER — Other Ambulatory Visit (HOSPITAL_COMMUNITY): Payer: Self-pay | Admitting: Family Medicine

## 2013-10-09 DIAGNOSIS — Z139 Encounter for screening, unspecified: Secondary | ICD-10-CM

## 2013-10-11 ENCOUNTER — Ambulatory Visit (HOSPITAL_COMMUNITY)
Admission: RE | Admit: 2013-10-11 | Discharge: 2013-10-11 | Disposition: A | Discharge status: Home / Self Care | Payer: Medicare Other | Source: Ambulatory Visit | Attending: Family Medicine | Admitting: Family Medicine

## 2013-10-11 DIAGNOSIS — Z1231 Encounter for screening mammogram for malignant neoplasm of breast: Secondary | ICD-10-CM | POA: Diagnosis not present

## 2013-10-11 DIAGNOSIS — Z139 Encounter for screening, unspecified: Secondary | ICD-10-CM

## 2013-12-18 DIAGNOSIS — H524 Presbyopia: Secondary | ICD-10-CM | POA: Diagnosis not present

## 2013-12-18 DIAGNOSIS — H33309 Unspecified retinal break, unspecified eye: Secondary | ICD-10-CM | POA: Diagnosis not present

## 2013-12-18 DIAGNOSIS — H5200 Hypermetropia, unspecified eye: Secondary | ICD-10-CM | POA: Diagnosis not present

## 2013-12-18 DIAGNOSIS — H52229 Regular astigmatism, unspecified eye: Secondary | ICD-10-CM | POA: Diagnosis not present

## 2014-05-12 IMAGING — MG MM DIGITAL SCREENING
4 series · 4 of 4 positions shown · non-contrast
Comparison: Previous exam(s).

CLINICAL DATA: Screening.

EXAM:
DIGITAL SCREENING BILATERAL MAMMOGRAM WITH CAD

[L CC]
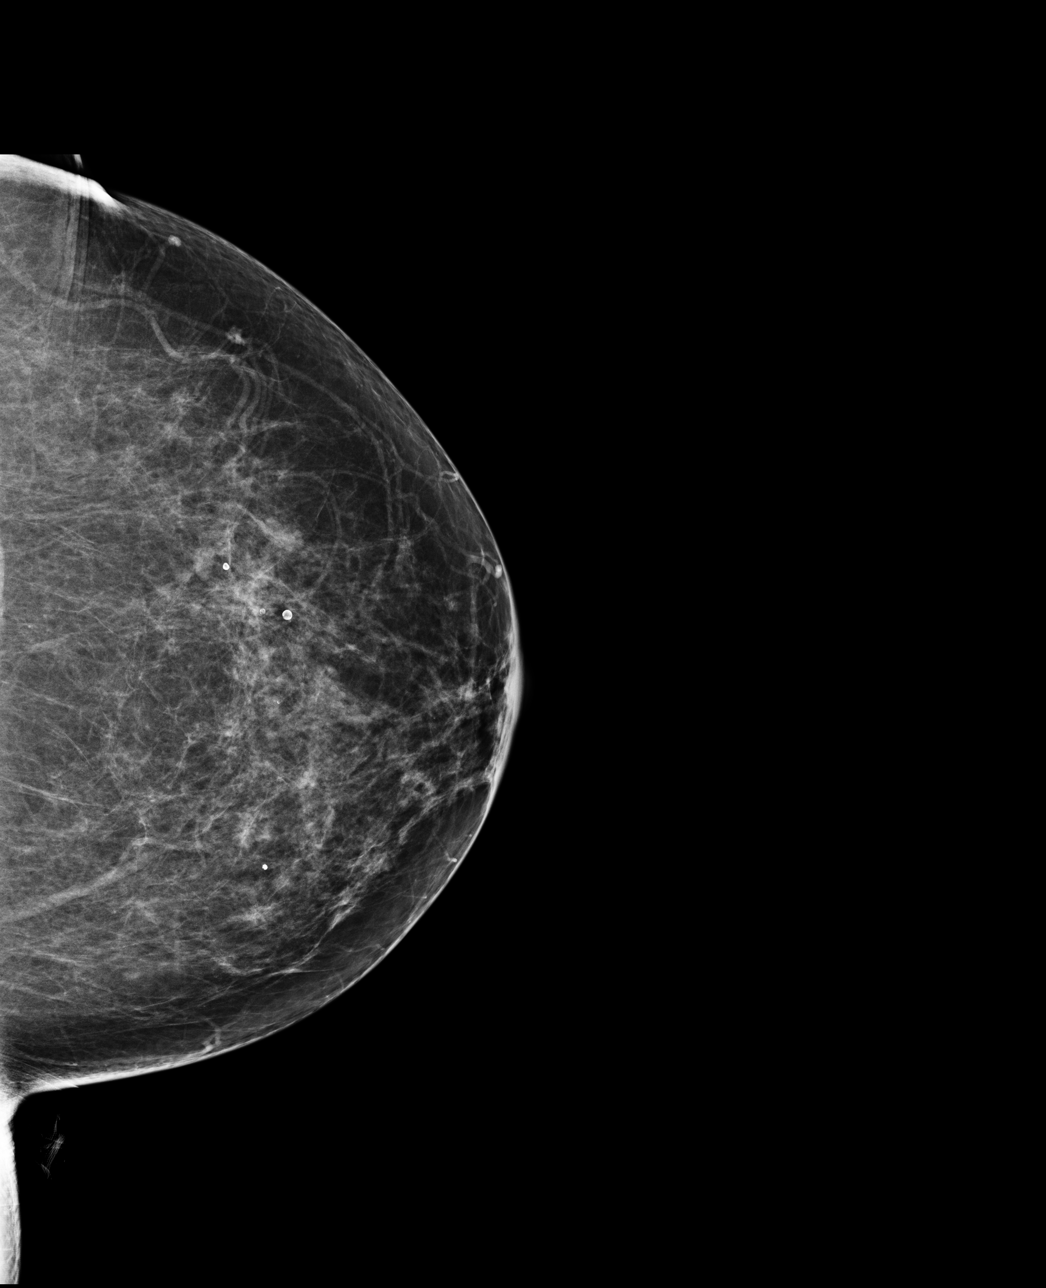

[L MLO]
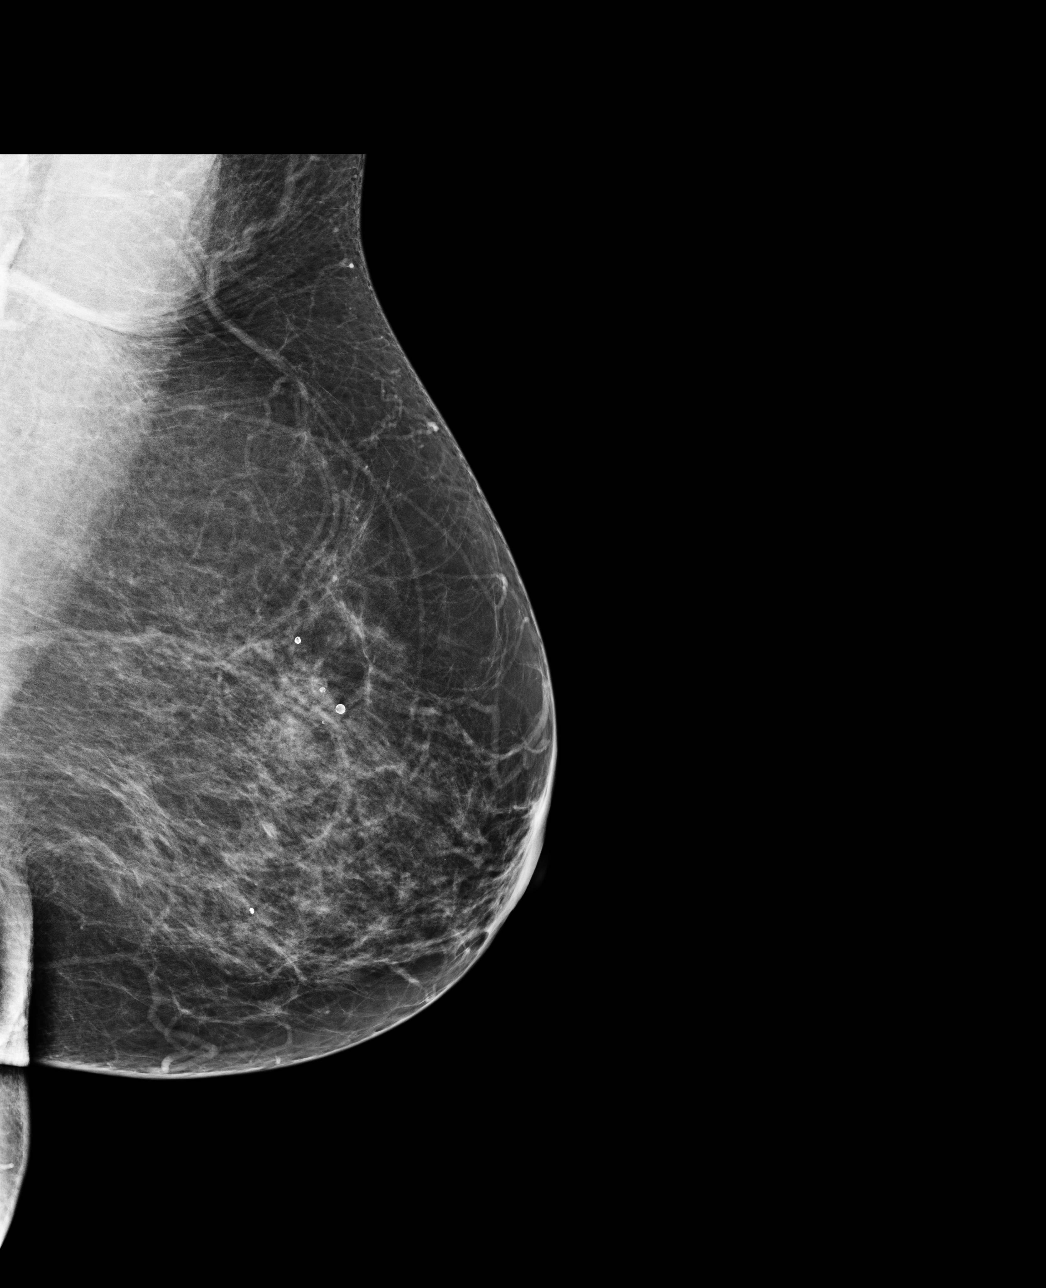

[R CC]
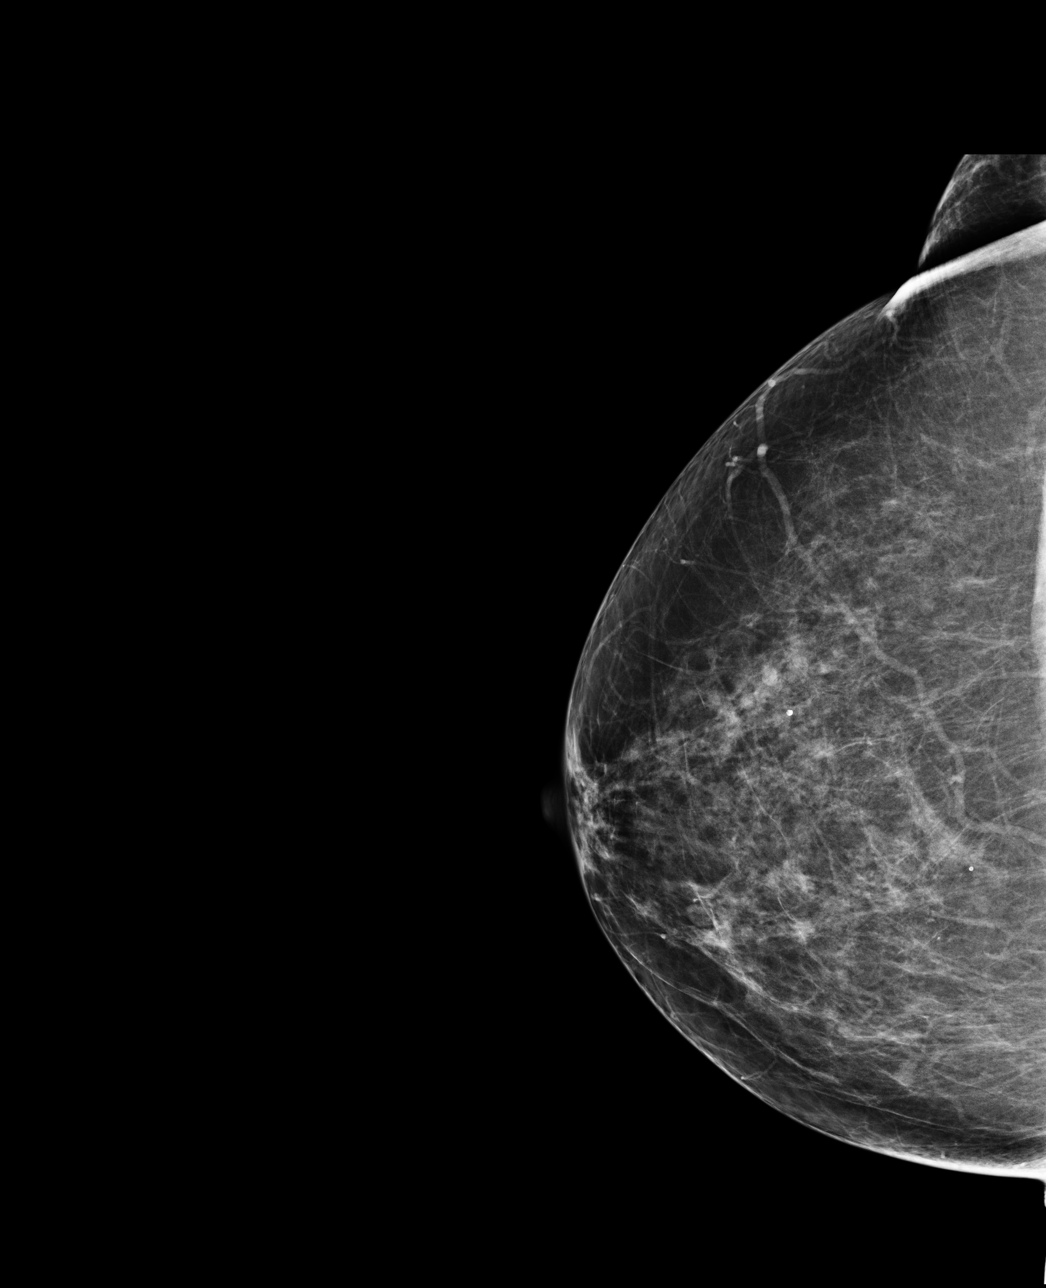

[R MLO]
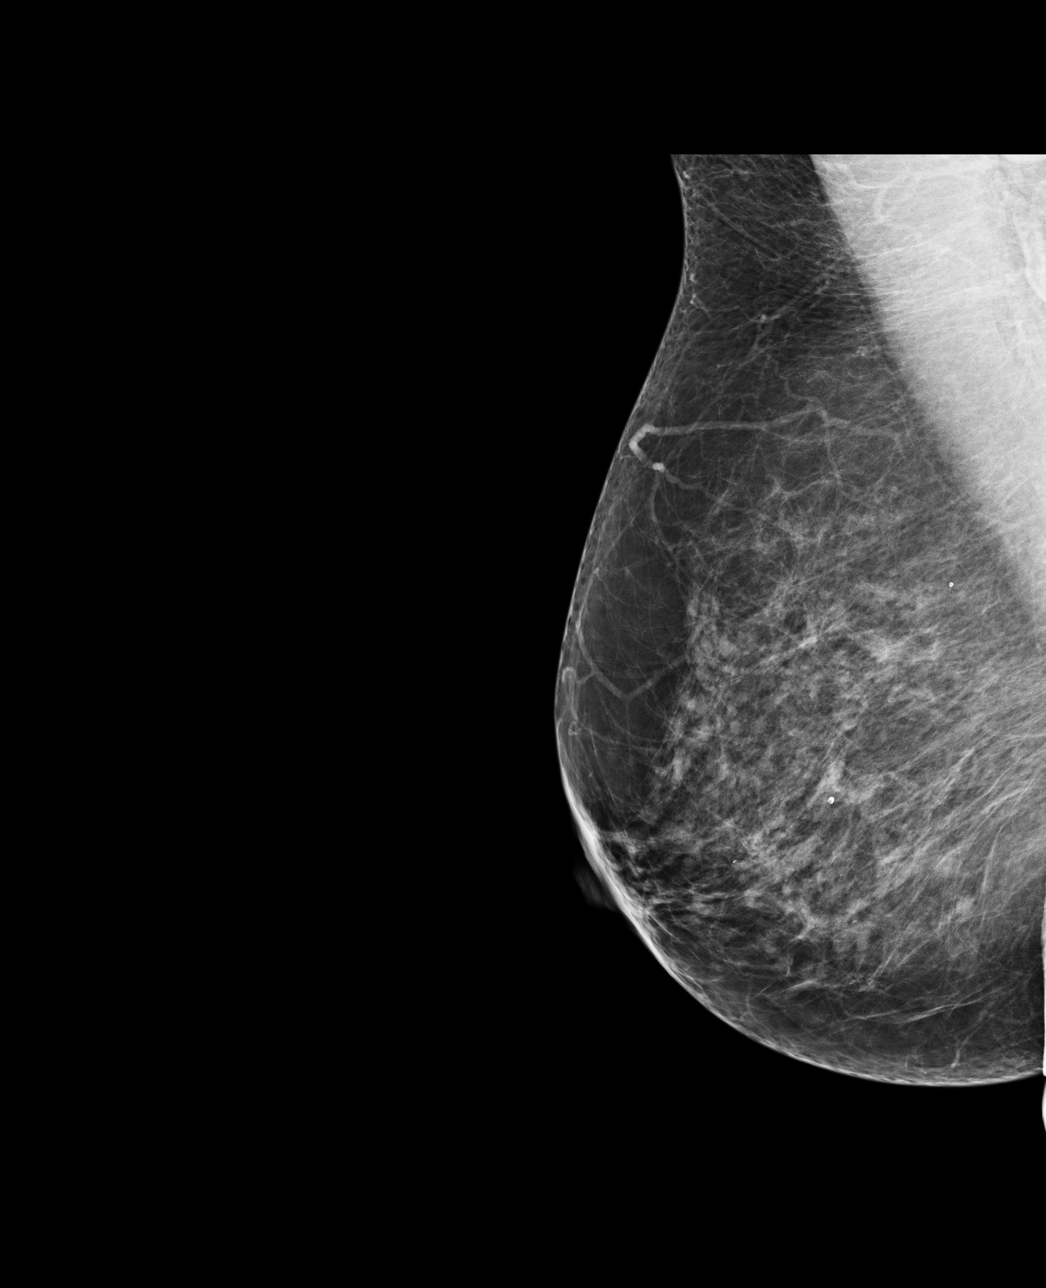

[4 of 4 positions shown; findings below may reference images not displayed]

ACR Breast Density Category b: There are scattered areas of
fibroglandular density.
FINDINGS: There are no findings suspicious for malignancy. Images were
processed with CAD.
IMPRESSION: No mammographic evidence of malignancy. A result letter of this
screening mammogram will be mailed directly to the patient.

RECOMMENDATION:
Screening mammogram in one year. (Code:AS-G-LCT)

BI-RADS CATEGORY  1: Negative.

## 2014-07-01 DIAGNOSIS — Z6828 Body mass index (BMI) 28.0-28.9, adult: Secondary | ICD-10-CM | POA: Diagnosis not present

## 2014-07-01 DIAGNOSIS — E663 Overweight: Secondary | ICD-10-CM | POA: Diagnosis not present

## 2014-07-01 DIAGNOSIS — Z23 Encounter for immunization: Secondary | ICD-10-CM | POA: Diagnosis not present

## 2014-07-01 DIAGNOSIS — I10 Essential (primary) hypertension: Secondary | ICD-10-CM | POA: Diagnosis not present

## 2014-07-01 DIAGNOSIS — E119 Type 2 diabetes mellitus without complications: Secondary | ICD-10-CM | POA: Diagnosis not present

## 2014-07-04 DIAGNOSIS — E119 Type 2 diabetes mellitus without complications: Secondary | ICD-10-CM | POA: Diagnosis not present

## 2014-09-09 DIAGNOSIS — Z6827 Body mass index (BMI) 27.0-27.9, adult: Secondary | ICD-10-CM | POA: Diagnosis not present

## 2014-09-09 DIAGNOSIS — Z0000 Encounter for general adult medical examination without abnormal findings: Secondary | ICD-10-CM | POA: Diagnosis not present

## 2014-09-09 DIAGNOSIS — E663 Overweight: Secondary | ICD-10-CM | POA: Diagnosis not present

## 2014-10-01 DIAGNOSIS — E1165 Type 2 diabetes mellitus with hyperglycemia: Secondary | ICD-10-CM | POA: Diagnosis not present

## 2014-10-01 DIAGNOSIS — B354 Tinea corporis: Secondary | ICD-10-CM | POA: Diagnosis not present

## 2014-10-01 DIAGNOSIS — Z23 Encounter for immunization: Secondary | ICD-10-CM | POA: Diagnosis not present

## 2014-10-01 DIAGNOSIS — E663 Overweight: Secondary | ICD-10-CM | POA: Diagnosis not present

## 2014-10-01 DIAGNOSIS — Z6828 Body mass index (BMI) 28.0-28.9, adult: Secondary | ICD-10-CM | POA: Diagnosis not present

## 2014-11-20 ENCOUNTER — Other Ambulatory Visit (HOSPITAL_COMMUNITY): Payer: Self-pay | Admitting: Family Medicine

## 2014-11-20 DIAGNOSIS — Z1231 Encounter for screening mammogram for malignant neoplasm of breast: Secondary | ICD-10-CM

## 2014-11-25 ENCOUNTER — Ambulatory Visit (HOSPITAL_COMMUNITY): Payer: Medicare Other

## 2014-12-02 ENCOUNTER — Ambulatory Visit (HOSPITAL_COMMUNITY)
Admission: RE | Admit: 2014-12-02 | Discharge: 2014-12-02 | Disposition: A | Discharge status: Home / Self Care | Payer: Medicare Other | Source: Ambulatory Visit | Attending: Family Medicine | Admitting: Family Medicine

## 2014-12-02 DIAGNOSIS — Z1231 Encounter for screening mammogram for malignant neoplasm of breast: Secondary | ICD-10-CM | POA: Diagnosis not present

## 2015-02-04 DIAGNOSIS — H33002 Unspecified retinal detachment with retinal break, left eye: Secondary | ICD-10-CM | POA: Diagnosis not present

## 2015-02-04 DIAGNOSIS — H5203 Hypermetropia, bilateral: Secondary | ICD-10-CM | POA: Diagnosis not present

## 2015-02-04 DIAGNOSIS — H524 Presbyopia: Secondary | ICD-10-CM | POA: Diagnosis not present

## 2015-02-04 DIAGNOSIS — H52223 Regular astigmatism, bilateral: Secondary | ICD-10-CM | POA: Diagnosis not present

## 2015-02-05 DIAGNOSIS — A35 Other tetanus: Secondary | ICD-10-CM | POA: Diagnosis not present

## 2015-02-05 DIAGNOSIS — S91134A Puncture wound without foreign body of right lesser toe(s) without damage to nail, initial encounter: Secondary | ICD-10-CM | POA: Diagnosis not present

## 2015-02-05 DIAGNOSIS — S90111A Contusion of right great toe without damage to nail, initial encounter: Secondary | ICD-10-CM | POA: Diagnosis not present

## 2015-02-12 DIAGNOSIS — E1165 Type 2 diabetes mellitus with hyperglycemia: Secondary | ICD-10-CM | POA: Diagnosis not present

## 2015-02-19 DIAGNOSIS — Z1389 Encounter for screening for other disorder: Secondary | ICD-10-CM | POA: Diagnosis not present

## 2015-02-19 DIAGNOSIS — E663 Overweight: Secondary | ICD-10-CM | POA: Diagnosis not present

## 2015-02-19 DIAGNOSIS — Z6827 Body mass index (BMI) 27.0-27.9, adult: Secondary | ICD-10-CM | POA: Diagnosis not present

## 2015-02-19 DIAGNOSIS — E1165 Type 2 diabetes mellitus with hyperglycemia: Secondary | ICD-10-CM | POA: Diagnosis not present

## 2015-02-25 DIAGNOSIS — D649 Anemia, unspecified: Secondary | ICD-10-CM | POA: Diagnosis not present

## 2015-02-25 DIAGNOSIS — Z79899 Other long term (current) drug therapy: Secondary | ICD-10-CM | POA: Diagnosis not present

## 2015-03-04 ENCOUNTER — Other Ambulatory Visit (HOSPITAL_COMMUNITY): Payer: Self-pay | Admitting: Family Medicine

## 2015-04-28 ENCOUNTER — Ambulatory Visit (INDEPENDENT_AMBULATORY_CARE_PROVIDER_SITE_OTHER): Payer: Medicare Other | Admitting: Orthopedic Surgery

## 2015-04-28 ENCOUNTER — Encounter: Payer: Self-pay | Admitting: Orthopedic Surgery

## 2015-04-28 ENCOUNTER — Ambulatory Visit (INDEPENDENT_AMBULATORY_CARE_PROVIDER_SITE_OTHER): Payer: Medicare Other

## 2015-04-28 VITALS — BP 141/69 | Ht <= 58 in | Wt 135.0 lb

## 2015-04-28 DIAGNOSIS — S93601A Unspecified sprain of right foot, initial encounter: Secondary | ICD-10-CM | POA: Diagnosis not present

## 2015-04-28 DIAGNOSIS — M25571 Pain in right ankle and joints of right foot: Secondary | ICD-10-CM

## 2015-04-28 NOTE — Patient Instructions (Signed)
Aleve as needed  Cam walker 6 weeks

## 2015-04-28 NOTE — Progress Notes (Signed)
Patient ID: Ann Vasquez, female   DOB: 1945/12/16, 69 y.o.   MRN: [id number]  Chief Complaint  Patient presents with  . Foot Pain    Right foot pain,     HPI ED RAYSON is a 69 y.o. female.  This is retired 69 year old female who presents with pain in her right foot 6 weeks. She jumped and landed on the ball of her right foot felt immediate pain and thought it would go away but 6 weeks later still having pain in the first second third and fourth metatarsophalangeal joints which is exacerbated by walking. The pain is mild to moderate quality is aching. The previous treatment is none  Review of systems is negative for blistering swelling fever redness  Review of Systems Review of Systems  Past Medical History  Diagnosis Date  . HTN (hypertension)   . High cholesterol   . Diabetes mellitus type 2, diet-controlled   . Hyperlipidemia   . Frequent headaches   . Cellulitis and abscess of leg     extends to foot  . Anemia   . Heart murmur     Past Surgical History  Procedure Laterality Date  . Right shoulder  2011    rotator cuff repair, Aline Brochure, [facility]  . Shoulder open rotator cuff repair  10/29/2011    Procedure: left ROTATOR CUFF REPAIR SHOULDER OPEN;  Surgeon: Arther Abbott, MD;  Location: [facility];  Service: Orthopedics;  Laterality: Left;    Family History  Problem Relation Age of Onset  . Anesthesia problems Neg Hx   . Malignant hyperthermia Neg Hx   . Hypotension Neg Hx   . Pseudochol deficiency Neg Hx     Social History Social History  Substance Use Topics  . Smoking status: Never Smoker   . Smokeless tobacco: None  . Alcohol Use: No    No Known Allergies  Current Outpatient Prescriptions  Medication Sig Dispense Refill  . HYDROcodone-acetaminophen (NORCO/VICODIN) 5-325 MG per tablet Take 1 tablet by mouth every 4 (four) hours as needed for pain. 20 tablet 0  . ibuprofen (ADVIL,MOTRIN) 200 MG tablet Take 400 mg by mouth every 6 (six) hours as  needed for pain.    Marland Kitchen lisinopril (PRINIVIL,ZESTRIL) 20 MG tablet Take 20 mg by mouth daily.    . rosuvastatin (CRESTOR) 10 MG tablet Take 10 mg by mouth at bedtime.      No current facility-administered medications for this visit.       Physical Exam Physical Exam Blood pressure 141/69, height 4\' 10"  (1.473 m), weight 135 lb (61.236 kg). Bilateral foot exam shows that the left foot is normal to palpation with normal range of motion of the MTP joints no instability muscle tone is excellent without atrophy neurovascular exam is intact  On the right foot we find no swelling but painful dorsiflexion plantar flexion of the MTP joint tenderness of the first second third and fourth MTP joint without instability. Muscle tone is normal without atrophy neurovascular exam is intact skin is normal Data Reviewed X-rays are negative personally reviewed and read by me today  Assessment    Encounter Diagnoses  Name Primary?  . Pain in joint, ankle and foot, right   . Foot sprain, right, initial encounter Yes        Plan    Short Cam Walker 6 weeks follow-up 6 weeks Aleve twice a day for pain if needed       Arther Abbott 04/28/2015, 11:04 AM

## 2015-05-23 ENCOUNTER — Other Ambulatory Visit (HOSPITAL_COMMUNITY): Payer: Self-pay | Admitting: Family Medicine

## 2015-05-23 DIAGNOSIS — Z23 Encounter for immunization: Secondary | ICD-10-CM | POA: Diagnosis not present

## 2015-05-23 DIAGNOSIS — E119 Type 2 diabetes mellitus without complications: Secondary | ICD-10-CM | POA: Diagnosis not present

## 2015-05-23 DIAGNOSIS — M8580 Other specified disorders of bone density and structure, unspecified site: Secondary | ICD-10-CM

## 2015-05-23 DIAGNOSIS — E782 Mixed hyperlipidemia: Secondary | ICD-10-CM | POA: Diagnosis not present

## 2015-05-23 DIAGNOSIS — Z1389 Encounter for screening for other disorder: Secondary | ICD-10-CM | POA: Diagnosis not present

## 2015-05-23 DIAGNOSIS — Z6827 Body mass index (BMI) 27.0-27.9, adult: Secondary | ICD-10-CM | POA: Diagnosis not present

## 2015-05-23 DIAGNOSIS — E663 Overweight: Secondary | ICD-10-CM | POA: Diagnosis not present

## 2015-06-05 ENCOUNTER — Ambulatory Visit (HOSPITAL_COMMUNITY)
Admission: RE | Admit: 2015-06-05 | Discharge: 2015-06-05 | Disposition: A | Discharge status: Home / Self Care | Payer: Medicare Other | Source: Ambulatory Visit | Attending: Family Medicine | Admitting: Family Medicine

## 2015-06-05 DIAGNOSIS — M810 Age-related osteoporosis without current pathological fracture: Secondary | ICD-10-CM | POA: Diagnosis not present

## 2015-06-05 DIAGNOSIS — Z780 Asymptomatic menopausal state: Secondary | ICD-10-CM | POA: Diagnosis not present

## 2015-06-05 DIAGNOSIS — M8580 Other specified disorders of bone density and structure, unspecified site: Secondary | ICD-10-CM

## 2015-06-05 DIAGNOSIS — M899 Disorder of bone, unspecified: Secondary | ICD-10-CM | POA: Diagnosis present

## 2015-06-09 ENCOUNTER — Ambulatory Visit (INDEPENDENT_AMBULATORY_CARE_PROVIDER_SITE_OTHER): Payer: Medicare Other | Admitting: Orthopedic Surgery

## 2015-06-09 DIAGNOSIS — S93601D Unspecified sprain of right foot, subsequent encounter: Secondary | ICD-10-CM

## 2015-06-09 NOTE — Patient Instructions (Addendum)
Soak foot in warm epsom salt water for 30 minutes daily at night time

## 2015-06-09 NOTE — Progress Notes (Signed)
Patient ID: Ann Vasquez, female   DOB: [birth year], 69 y.o.   MRN: [id number]  Follow up visit  Pain right foot   Encounter Diagnosis  Name Primary?  Marland Kitchen Foot sprain, right, subsequent encounter Yes    HPI: 69 year old female treated for sprain of the right foot after landing on the ball of her foot in mid July. We treated her with Cam Walker and Aleve she comes in for recheck.  The patient says it feels better but she still has a little soreness and swelling at the MTP joints of the foot  Review of systems no numbness or tingling  Examination inspection reveals swelling of the right foot at the MTP joint level with tenderness of all 5 MTP areas. No motor atrophy. No instability on the shuck test. The tarsometatarsal joint is nontender and stable neurovascular exam is intact. Range of motion is painful at the MTP joint but normal  She can remove the boot check with me again in 4 weeks if were not completely resolved then we might need to MRI the area to assure we don't have occult fracture

## 2015-07-07 ENCOUNTER — Encounter: Payer: Self-pay | Admitting: Orthopedic Surgery

## 2015-07-07 ENCOUNTER — Ambulatory Visit (INDEPENDENT_AMBULATORY_CARE_PROVIDER_SITE_OTHER): Payer: Medicare Other | Admitting: Orthopedic Surgery

## 2015-07-07 VITALS — BP 137/64 | Ht <= 58 in | Wt 139.0 lb

## 2015-07-07 DIAGNOSIS — S93601D Unspecified sprain of right foot, subsequent encounter: Secondary | ICD-10-CM | POA: Diagnosis not present

## 2015-07-07 NOTE — Progress Notes (Signed)
Patient ID: Ann Vasquez, female   DOB: Jan 23, 1946, 69 y.o.   MRN: [id number]  Follow up visit  Chief Complaint  Patient presents with  . Follow-up    4 week follow up right foot sprain    BP 137/64 mmHg  Ht 4\' 10"  (1.473 m)  Wt 139 lb (63.05 kg)  BMI 29.06 kg/m2  Encounter Diagnosis  Name Primary?  Marland Kitchen Foot sprain, right, subsequent encounter Yes    [name] has ongoing pain in the second metatarsal phalangeal joint after spraining her foot that improving requiring 1 Advil or 2 Advil intermittently.  Exam shows tenderness and painful extension dorsal and plantar aspect second and third metatarsal phalangeal joint but improving  Recommend continue ibuprofen twice to 3 times a day 2 tablets for the next 2-3 weeks no follow-up necessary no MRI necessary

## 2015-07-07 NOTE — Patient Instructions (Signed)
Ibuprofen two tabs three times daily for two weeks

## 2015-07-23 DIAGNOSIS — Z23 Encounter for immunization: Secondary | ICD-10-CM | POA: Diagnosis not present

## 2015-08-21 DIAGNOSIS — H5203 Hypermetropia, bilateral: Secondary | ICD-10-CM | POA: Diagnosis not present

## 2015-08-21 DIAGNOSIS — H52223 Regular astigmatism, bilateral: Secondary | ICD-10-CM | POA: Diagnosis not present

## 2015-08-21 DIAGNOSIS — H33032 Retinal detachment with giant retinal tear, left eye: Secondary | ICD-10-CM | POA: Diagnosis not present

## 2015-08-21 DIAGNOSIS — H524 Presbyopia: Secondary | ICD-10-CM | POA: Diagnosis not present

## 2015-09-17 DIAGNOSIS — Z23 Encounter for immunization: Secondary | ICD-10-CM | POA: Diagnosis not present

## 2015-10-08 DIAGNOSIS — E663 Overweight: Secondary | ICD-10-CM | POA: Diagnosis not present

## 2015-10-08 DIAGNOSIS — Z1389 Encounter for screening for other disorder: Secondary | ICD-10-CM | POA: Diagnosis not present

## 2015-10-08 DIAGNOSIS — Z6828 Body mass index (BMI) 28.0-28.9, adult: Secondary | ICD-10-CM | POA: Diagnosis not present

## 2015-10-08 DIAGNOSIS — Z23 Encounter for immunization: Secondary | ICD-10-CM | POA: Diagnosis not present

## 2015-10-08 DIAGNOSIS — Z0000 Encounter for general adult medical examination without abnormal findings: Secondary | ICD-10-CM | POA: Diagnosis not present

## 2015-10-08 DIAGNOSIS — E119 Type 2 diabetes mellitus without complications: Secondary | ICD-10-CM | POA: Diagnosis not present

## 2015-11-14 DIAGNOSIS — H6692 Otitis media, unspecified, left ear: Secondary | ICD-10-CM | POA: Diagnosis not present

## 2015-11-14 DIAGNOSIS — J329 Chronic sinusitis, unspecified: Secondary | ICD-10-CM | POA: Diagnosis not present

## 2015-11-14 DIAGNOSIS — J029 Acute pharyngitis, unspecified: Secondary | ICD-10-CM | POA: Diagnosis not present

## 2016-02-04 DIAGNOSIS — H25013 Cortical age-related cataract, bilateral: Secondary | ICD-10-CM | POA: Diagnosis not present

## 2016-02-04 DIAGNOSIS — H52223 Regular astigmatism, bilateral: Secondary | ICD-10-CM | POA: Diagnosis not present

## 2016-02-04 DIAGNOSIS — E119 Type 2 diabetes mellitus without complications: Secondary | ICD-10-CM | POA: Diagnosis not present

## 2016-02-04 DIAGNOSIS — H33002 Unspecified retinal detachment with retinal break, left eye: Secondary | ICD-10-CM | POA: Diagnosis not present

## 2016-02-04 DIAGNOSIS — H5203 Hypermetropia, bilateral: Secondary | ICD-10-CM | POA: Diagnosis not present

## 2016-02-16 ENCOUNTER — Other Ambulatory Visit (HOSPITAL_COMMUNITY): Payer: Self-pay | Admitting: Family Medicine

## 2016-02-16 DIAGNOSIS — Z1231 Encounter for screening mammogram for malignant neoplasm of breast: Secondary | ICD-10-CM

## 2016-02-18 ENCOUNTER — Ambulatory Visit (HOSPITAL_COMMUNITY): Payer: Medicare Other

## 2016-02-18 ENCOUNTER — Other Ambulatory Visit (HOSPITAL_COMMUNITY): Payer: Self-pay | Admitting: Family Medicine

## 2016-02-18 ENCOUNTER — Ambulatory Visit (HOSPITAL_COMMUNITY)
Admission: RE | Admit: 2016-02-18 | Discharge: 2016-02-18 | Disposition: A | Discharge status: Home / Self Care | Payer: Medicare Other | Source: Ambulatory Visit | Attending: Family Medicine | Admitting: Family Medicine

## 2016-02-18 DIAGNOSIS — Z1231 Encounter for screening mammogram for malignant neoplasm of breast: Secondary | ICD-10-CM | POA: Insufficient documentation

## 2016-06-16 DIAGNOSIS — E119 Type 2 diabetes mellitus without complications: Secondary | ICD-10-CM | POA: Diagnosis not present

## 2016-06-16 DIAGNOSIS — Z1389 Encounter for screening for other disorder: Secondary | ICD-10-CM | POA: Diagnosis not present

## 2016-06-16 DIAGNOSIS — E669 Obesity, unspecified: Secondary | ICD-10-CM | POA: Diagnosis not present

## 2016-06-16 DIAGNOSIS — Z6828 Body mass index (BMI) 28.0-28.9, adult: Secondary | ICD-10-CM | POA: Diagnosis not present

## 2016-06-16 DIAGNOSIS — H918X1 Other specified hearing loss, right ear: Secondary | ICD-10-CM | POA: Diagnosis not present

## 2016-06-16 DIAGNOSIS — E663 Overweight: Secondary | ICD-10-CM | POA: Diagnosis not present

## 2016-06-16 DIAGNOSIS — I10 Essential (primary) hypertension: Secondary | ICD-10-CM | POA: Diagnosis not present

## 2016-06-16 DIAGNOSIS — H9201 Otalgia, right ear: Secondary | ICD-10-CM | POA: Diagnosis not present

## 2016-07-05 ENCOUNTER — Ambulatory Visit (INDEPENDENT_AMBULATORY_CARE_PROVIDER_SITE_OTHER): Payer: Medicare Other | Admitting: Otolaryngology

## 2016-07-05 DIAGNOSIS — H608X1 Other otitis externa, right ear: Secondary | ICD-10-CM

## 2016-07-05 DIAGNOSIS — H9201 Otalgia, right ear: Secondary | ICD-10-CM | POA: Diagnosis not present

## 2016-07-05 DIAGNOSIS — H903 Sensorineural hearing loss, bilateral: Secondary | ICD-10-CM

## 2016-07-19 DIAGNOSIS — Z23 Encounter for immunization: Secondary | ICD-10-CM | POA: Diagnosis not present

## 2016-08-02 ENCOUNTER — Ambulatory Visit (INDEPENDENT_AMBULATORY_CARE_PROVIDER_SITE_OTHER): Payer: Medicare Other | Admitting: Otolaryngology

## 2016-08-02 DIAGNOSIS — H6981 Other specified disorders of Eustachian tube, right ear: Secondary | ICD-10-CM

## 2016-08-02 DIAGNOSIS — H9201 Otalgia, right ear: Secondary | ICD-10-CM | POA: Diagnosis not present

## 2016-08-05 DIAGNOSIS — E119 Type 2 diabetes mellitus without complications: Secondary | ICD-10-CM | POA: Diagnosis not present

## 2016-08-05 DIAGNOSIS — Z6827 Body mass index (BMI) 27.0-27.9, adult: Secondary | ICD-10-CM | POA: Diagnosis not present

## 2016-08-05 DIAGNOSIS — E782 Mixed hyperlipidemia: Secondary | ICD-10-CM | POA: Diagnosis not present

## 2016-08-05 DIAGNOSIS — E663 Overweight: Secondary | ICD-10-CM | POA: Diagnosis not present

## 2016-08-05 DIAGNOSIS — Z1389 Encounter for screening for other disorder: Secondary | ICD-10-CM | POA: Diagnosis not present

## 2016-08-05 DIAGNOSIS — I10 Essential (primary) hypertension: Secondary | ICD-10-CM | POA: Diagnosis not present

## 2016-08-05 DIAGNOSIS — Z79899 Other long term (current) drug therapy: Secondary | ICD-10-CM | POA: Diagnosis not present

## 2017-03-21 DIAGNOSIS — Z6827 Body mass index (BMI) 27.0-27.9, adult: Secondary | ICD-10-CM | POA: Diagnosis not present

## 2017-03-21 DIAGNOSIS — E663 Overweight: Secondary | ICD-10-CM | POA: Diagnosis not present

## 2017-03-21 DIAGNOSIS — Z1389 Encounter for screening for other disorder: Secondary | ICD-10-CM | POA: Diagnosis not present

## 2017-03-22 ENCOUNTER — Other Ambulatory Visit (HOSPITAL_COMMUNITY): Payer: Self-pay | Admitting: Family Medicine

## 2017-03-22 DIAGNOSIS — Z1231 Encounter for screening mammogram for malignant neoplasm of breast: Secondary | ICD-10-CM

## 2017-03-28 ENCOUNTER — Ambulatory Visit (HOSPITAL_COMMUNITY)
Admission: RE | Admit: 2017-03-28 | Discharge: 2017-03-28 | Disposition: A | Discharge status: Home / Self Care | Payer: Medicare Other | Source: Ambulatory Visit | Attending: Family Medicine | Admitting: Family Medicine

## 2017-03-28 DIAGNOSIS — Z1231 Encounter for screening mammogram for malignant neoplasm of breast: Secondary | ICD-10-CM | POA: Diagnosis not present

## 2017-08-16 ENCOUNTER — Other Ambulatory Visit: Payer: Self-pay

## 2017-08-16 NOTE — Patient Outreach (Signed)
[place] [facility]) Care Management  08/16/2017  Ann Vasquez 1946/08/30 [id number]   Medication Adherence call to Mrs. Ann Vasquez patient is showing past due under [place]. On Lisinopril 20 mg could not get in touch with patient but,talk to [place] they said patient already pick up this medication for a three month supply patient wont be due until February 2019.   [facility] Direct Dial [phone]  Fax [phone] [email or web address]

## 2017-12-12 ENCOUNTER — Other Ambulatory Visit (HOSPITAL_COMMUNITY): Payer: Self-pay | Admitting: Family Medicine

## 2017-12-13 ENCOUNTER — Other Ambulatory Visit (HOSPITAL_COMMUNITY): Payer: Self-pay | Admitting: Family Medicine

## 2017-12-13 DIAGNOSIS — E2839 Other primary ovarian failure: Secondary | ICD-10-CM

## 2017-12-30 ENCOUNTER — Encounter (HOSPITAL_COMMUNITY): Payer: Self-pay

## 2017-12-30 ENCOUNTER — Other Ambulatory Visit (HOSPITAL_COMMUNITY): Payer: BC Managed Care – PPO

## 2018-03-07 ENCOUNTER — Other Ambulatory Visit (HOSPITAL_COMMUNITY): Payer: Self-pay | Admitting: Physician Assistant

## 2018-03-07 DIAGNOSIS — Z1231 Encounter for screening mammogram for malignant neoplasm of breast: Secondary | ICD-10-CM

## 2018-03-30 ENCOUNTER — Encounter (HOSPITAL_COMMUNITY): Payer: Self-pay

## 2018-03-30 ENCOUNTER — Ambulatory Visit (HOSPITAL_COMMUNITY)
Admission: RE | Admit: 2018-03-30 | Discharge: 2018-03-30 | Disposition: A | Discharge status: Home / Self Care | Payer: Medicare Other | Source: Ambulatory Visit | Attending: Physician Assistant | Admitting: Physician Assistant

## 2018-03-30 DIAGNOSIS — Z1231 Encounter for screening mammogram for malignant neoplasm of breast: Secondary | ICD-10-CM | POA: Insufficient documentation

## 2019-04-04 ENCOUNTER — Other Ambulatory Visit (HOSPITAL_COMMUNITY): Payer: Self-pay | Admitting: Family Medicine

## 2019-04-04 DIAGNOSIS — Z1231 Encounter for screening mammogram for malignant neoplasm of breast: Secondary | ICD-10-CM

## 2019-04-09 ENCOUNTER — Other Ambulatory Visit: Payer: Self-pay

## 2019-04-09 ENCOUNTER — Encounter (HOSPITAL_COMMUNITY): Payer: Self-pay

## 2019-04-09 ENCOUNTER — Ambulatory Visit (HOSPITAL_COMMUNITY)
Admission: RE | Admit: 2019-04-09 | Discharge: 2019-04-09 | Disposition: A | Discharge status: Home / Self Care | Payer: Medicare Other | Source: Ambulatory Visit | Attending: Family Medicine | Admitting: Family Medicine

## 2019-04-09 DIAGNOSIS — Z1231 Encounter for screening mammogram for malignant neoplasm of breast: Secondary | ICD-10-CM

## 2019-06-08 ENCOUNTER — Emergency Department (HOSPITAL_COMMUNITY): Payer: Medicare Other

## 2019-06-08 ENCOUNTER — Emergency Department (HOSPITAL_COMMUNITY)
Admission: EM | Admit: 2019-06-08 | Discharge: 2019-06-08 | Disposition: A | Discharge status: Home / Self Care | Payer: Medicare Other | Attending: Emergency Medicine | Admitting: Emergency Medicine

## 2019-06-08 ENCOUNTER — Encounter (HOSPITAL_COMMUNITY): Payer: Self-pay

## 2019-06-08 ENCOUNTER — Other Ambulatory Visit: Payer: Self-pay

## 2019-06-08 DIAGNOSIS — E119 Type 2 diabetes mellitus without complications: Secondary | ICD-10-CM | POA: Diagnosis not present

## 2019-06-08 DIAGNOSIS — H81399 Other peripheral vertigo, unspecified ear: Secondary | ICD-10-CM

## 2019-06-08 DIAGNOSIS — Z79899 Other long term (current) drug therapy: Secondary | ICD-10-CM | POA: Insufficient documentation

## 2019-06-08 DIAGNOSIS — I10 Essential (primary) hypertension: Secondary | ICD-10-CM | POA: Diagnosis not present

## 2019-06-08 DIAGNOSIS — R42 Dizziness and giddiness: Secondary | ICD-10-CM | POA: Diagnosis not present

## 2019-06-08 DIAGNOSIS — R0981 Nasal congestion: Secondary | ICD-10-CM

## 2019-06-08 LAB — CBC
HCT: 40 % (ref 36.0–46.0)
Hemoglobin: 11.9 g/dL — ABNORMAL LOW (ref 12.0–15.0)
MCH: 23 pg — ABNORMAL LOW (ref 26.0–34.0)
MCHC: 29.8 g/dL — ABNORMAL LOW (ref 30.0–36.0)
MCV: 77.2 fL — ABNORMAL LOW (ref 80.0–100.0)
Platelets: 244 10*3/uL (ref 150–400)
RBC: 5.18 MIL/uL — ABNORMAL HIGH (ref 3.87–5.11)
RDW: 14.4 % (ref 11.5–15.5)
WBC: 5.2 10*3/uL (ref 4.0–10.5)
nRBC: 0 % (ref 0.0–0.2)

## 2019-06-08 LAB — URINALYSIS, ROUTINE W REFLEX MICROSCOPIC
Bacteria, UA: NONE SEEN
Bilirubin Urine: NEGATIVE
Glucose, UA: NEGATIVE mg/dL
Hgb urine dipstick: NEGATIVE
Ketones, ur: NEGATIVE mg/dL
Nitrite: NEGATIVE
Protein, ur: NEGATIVE mg/dL
Specific Gravity, Urine: 1.004 — ABNORMAL LOW (ref 1.005–1.030)
pH: 8 (ref 5.0–8.0)

## 2019-06-08 LAB — RAPID URINE DRUG SCREEN, HOSP PERFORMED
Amphetamines: NOT DETECTED
Barbiturates: NOT DETECTED
Benzodiazepines: NOT DETECTED
Cocaine: NOT DETECTED
Opiates: NOT DETECTED
Tetrahydrocannabinol: NOT DETECTED

## 2019-06-08 LAB — HEPATIC FUNCTION PANEL
ALT: 16 U/L (ref 0–44)
AST: 21 U/L (ref 15–41)
Albumin: 4 g/dL (ref 3.5–5.0)
Alkaline Phosphatase: 70 U/L (ref 38–126)
Bilirubin, Direct: 0.1 mg/dL (ref 0.0–0.2)
Indirect Bilirubin: 0.3 mg/dL (ref 0.3–0.9)
Total Bilirubin: 0.4 mg/dL (ref 0.3–1.2)
Total Protein: 7.6 g/dL (ref 6.5–8.1)

## 2019-06-08 LAB — BASIC METABOLIC PANEL
Anion gap: 7 (ref 5–15)
BUN: 13 mg/dL (ref 8–23)
CO2: 25 mmol/L (ref 22–32)
Calcium: 10.2 mg/dL (ref 8.9–10.3)
Chloride: 108 mmol/L (ref 98–111)
Creatinine, Ser: 0.73 mg/dL (ref 0.44–1.00)
GFR calc Af Amer: >60 mL/min (ref >60)
GFR calc non Af Amer: >60 mL/min (ref >60)
Glucose, Bld: 154 mg/dL — ABNORMAL HIGH (ref 70–99)
Potassium: 4.3 mmol/L (ref 3.5–5.1)
Sodium: 140 mmol/L (ref 135–145)

## 2019-06-08 LAB — DIFFERENTIAL
Abs Immature Granulocytes: 0.01 10*3/uL (ref 0.00–0.07)
Basophils Absolute: 0 10*3/uL (ref 0.0–0.1)
Basophils Relative: 0 %
Eosinophils Absolute: 0.1 10*3/uL (ref 0.0–0.5)
Eosinophils Relative: 1 %
Immature Granulocytes: 0 %
Lymphocytes Relative: 33 %
Lymphs Abs: 1.7 10*3/uL (ref 0.7–4.0)
Monocytes Absolute: 0.4 10*3/uL (ref 0.1–1.0)
Monocytes Relative: 7 %
Neutro Abs: 3.1 10*3/uL (ref 1.7–7.7)
Neutrophils Relative %: 59 %

## 2019-06-08 LAB — PROTIME-INR
INR: 1.1 (ref 0.8–1.2)
Prothrombin Time: 13.9 seconds (ref 11.4–15.2)

## 2019-06-08 LAB — CBG MONITORING, ED: Glucose-Capillary: 163 mg/dL — ABNORMAL HIGH (ref 70–99)

## 2019-06-08 LAB — ETHANOL: Alcohol, Ethyl (B): <10 mg/dL (ref <10)

## 2019-06-08 LAB — APTT: aPTT: 39 seconds — ABNORMAL HIGH (ref 24–36)

## 2019-06-08 MED ORDER — MECLIZINE HCL 25 MG PO TABS: 25.0000 mg | ORAL_TABLET | Freq: Two times a day (BID) | ORAL | 0 refills | Status: DC | PRN

## 2019-06-08 MED ORDER — DIAZEPAM 2 MG PO TABS
2.0000 mg | ORAL_TABLET | Freq: Once | ORAL | Status: AC
  Administered 2019-06-08: 2 mg via ORAL
  Filled 2019-06-08: qty 1

## 2019-06-08 MED ORDER — MECLIZINE HCL 12.5 MG PO TABS
25.0000 mg | ORAL_TABLET | Freq: Once | ORAL | Status: AC
  Administered 2019-06-08: 25 mg via ORAL
  Filled 2019-06-08: qty 2

## 2019-06-08 NOTE — ED Provider Notes (Signed)
[facility] EMERGENCY DEPARTMENT Provider Note   CSN: [id number] Arrival date & time: 06/08/19  1044     History   Chief Complaint Chief Complaint  Patient presents with   Dizziness    HPI Ann Vasquez is a 73 y.o. female.     HPI Pt was seen at 1120. Per pt, c/o sudden onset and persistence of waxing and waning "dizziness" that began last night approximately 2100. Pt states she stood up to walk and "then felt like everything was spinning around." Pt states the spinning improves when she sits down and stays still. Pt also c/o "pressure" and congestion to her nose and sinuses that began yesterday. Denies ears ache, no fevers, no rash, no CP/palpitations, no SOB/cough, no abd pain, no N/V/D, no back or neck pain, no visual changes, no focal motor weakness, no tingling/numbness in extremities, no slurred speech, no facial droop.     Past Medical History:  Diagnosis Date   Anemia    Cellulitis and abscess of leg    extends to foot   Diabetes mellitus type 2, diet-controlled (HCC)    Frequent headaches    Heart murmur    High cholesterol    HTN (hypertension)    Hyperlipidemia     Patient Active Problem List   Diagnosis Date Noted   Tennis Must Quervain's disease (radial styloid tenosynovitis) 08/08/2012   Shoulder pain 11/17/2011   Shoulder stiffness 11/17/2011   Muscle weakness (generalized) 11/17/2011   S/P shoulder surgery 11/01/2011   Biceps tendon rupture, proximal 10/25/2011   Rotator cuff tear, left 10/25/2011   Rotator cuff tear 10/05/2011   Bursitis of shoulder, left 09/28/2011   Back pain 01/26/2011   IMPINGEMENT SYNDROME 09/29/2009   RUPTURE ROTATOR CUFF 09/29/2009   FIBROIDS, UTERUS 06/27/2009   DM 06/27/2009   HYPERLIPIDEMIA 06/27/2009   ANEMIA, IRON DEFICIENCY, MICROCYTIC 06/27/2009   GERD 06/27/2009    Past Surgical History:  Procedure Laterality Date   right shoulder  2011   rotator cuff repair, Aline Brochure, [facility]    SHOULDER OPEN ROTATOR CUFF REPAIR  10/29/2011   Procedure: left ROTATOR CUFF REPAIR SHOULDER OPEN;  Surgeon: Arther Abbott, MD;  Location: [facility];  Service: Orthopedics;  Laterality: Left;     OB History   No obstetric history on file.      Home Medications    Prior to Admission medications   Medication Sig Start Date End Date Taking? Authorizing Provider  diclofenac sodium (VOLTAREN) 1 % GEL Apply 1 application topically 4 (four) times daily. Apply 1 application topically 4 (four) times daily AS NEEDED 01/25/19  Yes [provider]  ibuprofen (ADVIL,MOTRIN) 200 MG tablet Take 400 mg by mouth every 6 (six) hours as needed for pain.   Yes [provider]  lisinopril (PRINIVIL,ZESTRIL) 20 MG tablet Take 20 mg by mouth daily.   Yes [provider]  METFORMIN HCL PO Take 500 mg by mouth daily.    Yes [provider]  rosuvastatin (CRESTOR) 10 MG tablet Take 10 mg by mouth at bedtime.    Yes [provider]    Family History Family History  Problem Relation Age of Onset   Anesthesia problems Neg Hx    Malignant hyperthermia Neg Hx    Hypotension Neg Hx    Pseudochol deficiency Neg Hx     Social History Social History   Tobacco Use   Smoking status: Never Smoker  Substance Use Topics   Alcohol use: No   Drug use:  No     Allergies   Patient has no known allergies.   Review of Systems Review of Systems ROS: Statement: All systems negative except as marked or noted in the HPI; Constitutional: Negative for fever and chills. ; ; Eyes: Negative for eye pain, redness and discharge. ; ; ENMT: Negative for ear pain, hoarseness, sore throat. +nasal congestion, sinus pressure. ; ; Cardiovascular: Negative for chest pain, palpitations, diaphoresis, dyspnea and peripheral edema. ; ; Respiratory: Negative for cough, wheezing and stridor. ; ; Gastrointestinal: Negative for nausea, vomiting, diarrhea, abdominal pain, blood in stool,  hematemesis, jaundice and rectal bleeding. . ; ; Genitourinary: Negative for dysuria, flank pain and hematuria. ; ; Musculoskeletal: Negative for back pain and neck pain. Negative for swelling and trauma.; ; Skin: Negative for pruritus, rash, abrasions, blisters, bruising and skin lesion.; ; Neuro: +"dizziness." Negative for headache, lightheadedness and neck stiffness. Negative for weakness, altered level of consciousness, altered mental status, extremity weakness, paresthesias, involuntary movement, seizure and syncope.       Physical Exam Updated Vital Signs BP (!) 136/59    Pulse (!) 55    Temp 97.6 F (36.4 C) (Oral)    Resp 16    Ht 4\' 11"  (1.499 m)    Wt 64.9 kg    SpO2 100%    BMI 28.88 kg/m    Patient Vitals for the past 24 hrs:  BP Temp Temp src Pulse Resp SpO2 Height Weight  06/08/19 1345 -- -- -- (!) 55 16 100 % -- --  06/08/19 1330 (!) 136/59 -- -- (!) 48 19 99 % -- --  06/08/19 1315 -- -- -- (!) 48 13 100 % -- --  06/08/19 1300 131/72 -- -- (!) 56 14 100 % -- --  06/08/19 1215 -- -- -- 63 20 100 % -- --  06/08/19 1200 128/77 -- -- (!) 54 16 97 % -- --  06/08/19 1145 -- -- -- 61 20 96 % -- --  06/08/19 1130 140/65 -- -- (!) 48 13 97 % -- --  06/08/19 1115 -- -- -- (!) 54 16 100 % -- --  06/08/19 1100 (!) 151/71 -- -- -- -- -- -- --  06/08/19 1057 (!) 178/92 97.6 F (36.4 C) Oral 67 17 100 % 4\' 11"  (1.499 m) 64.9 kg   12:24 Orthostatic Vital Signs MC  Orthostatic Lying   BP- Lying: 128/56  Pulse- Lying: 58      Orthostatic Sitting  BP- Sitting: 143/58  Pulse- Sitting: 52      Orthostatic Standing at 0 minutes  BP- Standing at 0 minutes: 138/74  Pulse- Standing at 0 minutes: 64     Physical Exam 1125: Physical examination:  Nursing notes reviewed; Vital signs and O2 SAT reviewed;  Constitutional: Well developed, Well nourished, Well hydrated, In no acute distress; Head:  Normocephalic, atraumatic; Eyes: EOMI, PERRL, No scleral icterus; ENMT: TM's clear bilat.  +edemetous nasal turbinates bilat with clear rhinorrhea. Mouth and pharynx normal, Mucous membranes moist; Neck: Supple, Full range of motion, No lymphadenopathy; Cardiovascular: Regular rate and rhythm, No gallop; Respiratory: Breath sounds clear & equal bilaterally, No wheezes.  Speaking full sentences with ease, Normal respiratory effort/excursion; Chest: Nontender, Movement normal; Abdomen: Soft, Nontender, Nondistended, Normal bowel sounds; Genitourinary: No CVA tenderness; Extremities: Peripheral pulses normal, No tenderness, No edema, No calf edema or asymmetry.; Neuro: AA&Ox3, Major CN grossly intact. Speech clear.  No facial droop. +right horizontal end gaze fatigable nystagmus which reproduces pt's symptoms. Grips  equal. Strength 5/5 equal bilat UE's and LE's.  DTR 2/4 equal bilat UE's and LE's.  No gross sensory deficits.  Normal cerebellar testing bilat UE's (finger-nose) and LE's (heel-shin)..; Skin: Color normal, Warm, Dry.   ED Treatments / Results  Labs (all labs ordered are listed, but only abnormal results are displayed)   EKG EKG Interpretation  Date/Time:  Friday June 08 2019 11:04:27 EDT Ventricular Rate:  63 PR Interval:    QRS Duration: 93 QT Interval:  404 QTC Calculation: 414 R Axis:   43 Text Interpretation:  Sinus rhythm Baseline wander When compared with ECG of 10/05/2012 No significant change was found Confirmed by Francine Graven [phone]) on 06/08/2019 11:40:13 AM   Radiology   Procedures Procedures (including critical care time)  Medications Ordered in ED Medications  meclizine (ANTIVERT) tablet 25 mg (25 mg Oral Given 06/08/19 1146)  diazepam (VALIUM) tablet 2 mg (2 mg Oral Given 06/08/19 1400)     Initial Impression / Assessment and Plan / ED Course  I have reviewed the triage vital signs and the nursing notes.  Pertinent labs & imaging results that were available during my care of the patient were reviewed by me and considered in my medical  decision making (see chart for details).     MDM Reviewed: previous chart, nursing note and vitals Reviewed previous: labs and ECG Interpretation: labs, ECG, MRI and CT scan   Results for orders placed or performed during the hospital encounter of [id number]  Basic metabolic panel  Result Value Ref Range   Sodium 140 135 - 145 mmol/L   Potassium 4.3 3.5 - 5.1 mmol/L   Chloride 108 98 - 111 mmol/L   CO2 25 22 - 32 mmol/L   Glucose, Bld 154 (H) 70 - 99 mg/dL   BUN 13 8 - 23 mg/dL   Creatinine, Ser 0.73 0.44 - 1.00 mg/dL   Calcium 10.2 8.9 - 10.3 mg/dL   GFR calc non Af Amer >60 >60 mL/min   GFR calc Af Amer >60 >60 mL/min   Anion gap 7 5 - 15  CBC  Result Value Ref Range   WBC 5.2 4.0 - 10.5 K/uL   RBC 5.18 (H) 3.87 - 5.11 MIL/uL   Hemoglobin 11.9 (L) 12.0 - 15.0 g/dL   HCT 40.0 36.0 - 46.0 %   MCV 77.2 (L) 80.0 - 100.0 fL   MCH 23.0 (L) 26.0 - 34.0 pg   MCHC 29.8 (L) 30.0 - 36.0 g/dL   RDW 14.4 11.5 - 15.5 %   Platelets 244 150 - 400 K/uL   nRBC 0.0 0.0 - 0.2 %  Urinalysis, Routine w reflex microscopic  Result Value Ref Range   Color, Urine COLORLESS (A) YELLOW   APPearance CLEAR CLEAR   Specific Gravity, Urine 1.004 (L) 1.005 - 1.030   pH 8.0 5.0 - 8.0   Glucose, UA NEGATIVE NEGATIVE mg/dL   Hgb urine dipstick NEGATIVE NEGATIVE   Bilirubin Urine NEGATIVE NEGATIVE   Ketones, ur NEGATIVE NEGATIVE mg/dL   Protein, ur NEGATIVE NEGATIVE mg/dL   Nitrite NEGATIVE NEGATIVE   Leukocytes,Ua TRACE (A) NEGATIVE   RBC / HPF 0-5 0 - 5 RBC/hpf   WBC, UA 0-5 0 - 5 WBC/hpf   Bacteria, UA NONE SEEN NONE SEEN   Squamous Epithelial / LPF 0-5 0 - 5  Hepatic function panel  Result Value Ref Range   Total Protein 7.6 6.5 - 8.1 g/dL   Albumin 4.0 3.5 - 5.0 g/dL  AST 21 15 - 41 U/L   ALT 16 0 - 44 U/L   Alkaline Phosphatase 70 38 - 126 U/L   Total Bilirubin 0.4 0.3 - 1.2 mg/dL   Bilirubin, Direct 0.1 0.0 - 0.2 mg/dL   Indirect Bilirubin 0.3 0.3 - 0.9 mg/dL  Ethanol  Result Value  Ref Range   Alcohol, Ethyl (B) <10 <10 mg/dL  Protime-INR  Result Value Ref Range   Prothrombin Time 13.9 11.4 - 15.2 seconds   INR 1.1 0.8 - 1.2  APTT  Result Value Ref Range   aPTT 39 (H) 24 - 36 seconds  Differential  Result Value Ref Range   Neutrophils Relative % 59 %   Neutro Abs 3.1 1.7 - 7.7 K/uL   Lymphocytes Relative 33 %   Lymphs Abs 1.7 0.7 - 4.0 K/uL   Monocytes Relative 7 %   Monocytes Absolute 0.4 0.1 - 1.0 K/uL   Eosinophils Relative 1 %   Eosinophils Absolute 0.1 0.0 - 0.5 K/uL   Basophils Relative 0 %   Basophils Absolute 0.0 0.0 - 0.1 K/uL   Immature Granulocytes 0 %   Abs Immature Granulocytes 0.01 0.00 - 0.07 K/uL  Urine rapid drug screen (hosp performed)  Result Value Ref Range   Opiates NONE DETECTED NONE DETECTED   Cocaine NONE DETECTED NONE DETECTED   Benzodiazepines NONE DETECTED NONE DETECTED   Amphetamines NONE DETECTED NONE DETECTED   Tetrahydrocannabinol NONE DETECTED NONE DETECTED   Barbiturates NONE DETECTED NONE DETECTED  CBG monitoring, ED  Result Value Ref Range   Glucose-Capillary 163 (H) 70 - 99 mg/dL   Ct Head Wo Contrast Result Date: 06/08/2019 CLINICAL DATA:  Dizziness EXAM: CT HEAD WITHOUT CONTRAST TECHNIQUE: Contiguous axial images were obtained from the base of the skull through the vertex without intravenous contrast. COMPARISON:  Jan 02, 2013 FINDINGS: Brain: Stable right parietal white matter lucency is identified unchanged. There is no midline shift, hydrocephalus or acute hemorrhage. No acute transcortical infarct is identified. Vascular: No hyperdense vessel is noted. Skull: Normal. Negative for fracture or focal lesion. Sinuses/Orbits: No acute finding. Other: None. IMPRESSION: No focal acute intracranial abnormality identified. Electronically Signed   By: Abelardo Diesel M.D.   On: 06/08/2019 13:15   Mr Brain Wo Contrast (neuro Protocol) Result Date: 06/08/2019 CLINICAL DATA:  Vertigo, episodic, peripheral. Additional history  provided: Patient reports dizziness and slight headache since 22:00 last night. Pressure in face. EXAM: MRI HEAD WITHOUT CONTRAST TECHNIQUE: Multiplanar, multiecho pulse sequences of the brain and surrounding structures were obtained without intravenous contrast. COMPARISON:  Noncontrast head CT 06/08/2019, brain MRI 12/22/2011 FINDINGS: Brain: There is no convincing evidence of acute infarct. No evidence of intracranial mass. No midline shift or extra-axial fluid collection. No chronic intracranial blood products. Redemonstrated chronic 1 cm lacunar infarct within the right parietal periventricular white matter (series 8, image 15). A few additional small scattered foci of T2/FLAIR hyperintensity within the cerebral white matter are unchanged and nonspecific, but likely reflect minimal chronic small vessel ischemic disease. Cerebral volume is normal for age. Vascular: Flow voids maintained within the proximal large arterial vessels. Skull and upper cervical spine: No focal marrow lesion Sinuses/Orbits: Visualized orbits demonstrate no acute abnormality. Mild ethmoid sinus mucosal thickening. No significant mastoid effusion. IMPRESSION: 1. No evidence of acute intracranial abnormality including acute infarct. 2. 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 There is a background of stable and minimal chronic small vessel ischemic disease. Electronically Signed  By: Kellie Simmering   On: 06/08/2019 14:46    Ann Vasquez was evaluated in Emergency Department on 06/08/2019 for the symptoms described in the history of present illness. She was evaluated in the context of the global COVID-19 pandemic, which necessitated consideration that the patient might be at risk for infection with the SARS-CoV-2 virus that causes COVID-19. Institutional protocols and algorithms that pertain to the evaluation of patients at risk for COVID-19 are in a state of rapid change based on  information released by regulatory bodies including the CDC and federal and state organizations. These policies and algorithms were followed during the patient's care in the ED.   1355:  Antivert PO given. Pt attempted to walk: c/o "dizziness" and was escorted back to stretcher. MRI brain and PO valium ordered.   1550:  MRI reassuring. Pt has tol PO well while in the ED without N/V. Abd remains benign, resps easy, neuro exam intact/unchanged, VSS. Not orthostatic on VS. Pt has ambulated with steady gait, easy resps, NAD. Feels better and wants to go home now. Tx sinus congestion symptomatically, tx vertigo with antivert. Dx and testing, as well as incidental finding(s), d/w pt and family.  Questions answered.  Verb understanding, agreeable to d/c home with outpt f/u.    Final Clinical Impressions(s) / ED Diagnoses   Final diagnoses:  None    ED Discharge Orders    None       Francine Graven, DO 06/13/19 2312

## 2019-06-08 NOTE — ED Notes (Signed)
Pt became dizzy with sitting up in bed to ambulate, pt ambulated to door in room and stated that the room was spinning.  Assisted pt back to bed and informed EDP of events.

## 2019-06-08 NOTE — ED Triage Notes (Signed)
Pt presents to ED with complaints of dizziness and slight headache since 2200 last night. Pt also c/o pressure in face

## 2019-06-08 NOTE — ED Notes (Signed)
Pt ambulated to nurse desk and back without any c/o dizziness, pt only c/o pressure around her eyes.

## 2019-06-08 NOTE — Discharge Instructions (Addendum)
Take the prescription as directed.  Move slowly when changing positions. Take over the counter decongestant (safe for use with high blood pressure), as directed on packaging, for the next week. Use over the counter allergy nasal spray (such as Flonase, Nasacort), as directed on packaging for the next week. Call your regular medical doctor today to schedule a follow up appointment within the next 3 days. Call the ENT doctor today to schedule a follow up appointment within the next week.  Return to the Emergency Department immediately sooner if worsening.

## 2019-07-02 ENCOUNTER — Encounter (INDEPENDENT_AMBULATORY_CARE_PROVIDER_SITE_OTHER): Payer: Self-pay | Admitting: *Deleted

## 2019-11-08 ENCOUNTER — Ambulatory Visit: Payer: Medicare Other

## 2019-11-09 ENCOUNTER — Ambulatory Visit: Payer: Medicare PPO | Attending: Internal Medicine

## 2019-11-09 DIAGNOSIS — Z23 Encounter for immunization: Secondary | ICD-10-CM

## 2019-11-09 NOTE — Progress Notes (Signed)
   Covid-19 Vaccination Clinic  Name:  Ann Vasquez    MRN: [id number] DOB: [birth year]  11/09/2019  Ann Vasquez was observed post Covid-19 immunization for 15 minutes without incident. She was provided with Vaccine Information Sheet and instruction to access the V-Safe system.   Ann Vasquez was instructed to call 911 with any severe reactions post vaccine: Marland Kitchen Difficulty breathing  . Swelling of face and throat  . A fast heartbeat  . A bad rash all over body  . Dizziness and weakness   Immunizations Administered    Name Date Dose VIS Date Route   Moderna COVID-19 Vaccine 11/09/2019  8:52 AM 0.5 mL 07/31/2019 Intramuscular   Manufacturer: Moderna   Lot: [id number]   [id number]

## 2019-12-11 ENCOUNTER — Ambulatory Visit: Payer: Medicare PPO | Attending: Internal Medicine

## 2019-12-11 DIAGNOSIS — Z23 Encounter for immunization: Secondary | ICD-10-CM

## 2019-12-11 NOTE — Progress Notes (Signed)
   Covid-19 Vaccination Clinic  Name:  Ann Vasquez    MRN: [id number] DOB: 04/04/1946  12/11/2019  [name] was observed post Covid-19 immunization for 15 minutes without incident. She was provided with Vaccine Information Sheet and instruction to access the V-Safe system.   [name] was instructed to call 911 with any severe reactions post vaccine: Marland Kitchen Difficulty breathing  . Swelling of face and throat  . A fast heartbeat  . A bad rash all over body  . Dizziness and weakness   Immunizations Administered    Name Date Dose VIS Date Route   Moderna COVID-19 Vaccine 12/11/2019 10:40 AM 0.5 mL 07/31/2019 Intramuscular   Manufacturer: Moderna   [id number]   [id number]

## 2020-01-31 DIAGNOSIS — Z1389 Encounter for screening for other disorder: Secondary | ICD-10-CM | POA: Diagnosis not present

## 2020-01-31 DIAGNOSIS — M79676 Pain in unspecified toe(s): Secondary | ICD-10-CM | POA: Diagnosis not present

## 2020-01-31 DIAGNOSIS — E119 Type 2 diabetes mellitus without complications: Secondary | ICD-10-CM | POA: Diagnosis not present

## 2020-01-31 DIAGNOSIS — Z6828 Body mass index (BMI) 28.0-28.9, adult: Secondary | ICD-10-CM | POA: Diagnosis not present

## 2020-01-31 DIAGNOSIS — Z0000 Encounter for general adult medical examination without abnormal findings: Secondary | ICD-10-CM | POA: Diagnosis not present

## 2020-01-31 DIAGNOSIS — E663 Overweight: Secondary | ICD-10-CM | POA: Diagnosis not present

## 2020-01-31 DIAGNOSIS — I10 Essential (primary) hypertension: Secondary | ICD-10-CM | POA: Diagnosis not present

## 2020-01-31 DIAGNOSIS — E7849 Other hyperlipidemia: Secondary | ICD-10-CM | POA: Diagnosis not present

## 2020-02-15 ENCOUNTER — Other Ambulatory Visit: Payer: Self-pay

## 2020-02-15 ENCOUNTER — Encounter: Payer: Self-pay | Admitting: Podiatry

## 2020-02-15 ENCOUNTER — Ambulatory Visit: Payer: Medicare PPO | Admitting: Podiatry

## 2020-02-15 ENCOUNTER — Ambulatory Visit (INDEPENDENT_AMBULATORY_CARE_PROVIDER_SITE_OTHER): Payer: Medicare PPO

## 2020-02-15 DIAGNOSIS — M2042 Other hammer toe(s) (acquired), left foot: Secondary | ICD-10-CM

## 2020-02-15 DIAGNOSIS — M2041 Other hammer toe(s) (acquired), right foot: Secondary | ICD-10-CM | POA: Diagnosis not present

## 2020-02-18 ENCOUNTER — Encounter: Payer: Self-pay | Admitting: Podiatry

## 2020-02-18 NOTE — Progress Notes (Signed)
  Subjective:  Patient ID: Ann Vasquez, female    DOB: Jun 27, 1946,  MRN: [id number]  Chief Complaint  Patient presents with  . Foot Problem    both the little toes are curving in but the right is the worst    74 y.o. female presents with the above complaint.  Patient presents with bilateral fifth digit adductovarus deformity with a complaint of pain on the lateral side of the toe.  Patient states it is carving out and is causing her some pain.  There is some soreness and tenderness associated.  There is no burning.  There is some throbbing.  Or switch shoes.  She has tried some shoe gear modification but has not helped much.  There is no swelling.  She is a diabetic but she does not know her last A1c.  She denies any other acute complaints.   Review of Systems: Negative except as noted in the HPI. Denies N/V/F/Ch.  Past Medical History:  Diagnosis Date  . Anemia   . Cellulitis and abscess of leg    extends to foot  . Diabetes mellitus type 2, diet-controlled ([place])   . Frequent headaches   . Heart murmur   . High cholesterol   . HTN (hypertension)   . Hyperlipidemia     Current Outpatient Medications:  .  amLODipine-valsartan (EXFORGE) 5-320 MG tablet, , Disp: , Rfl:  .  METFORMIN HCL PO, Take 500 mg by mouth daily. , Disp: , Rfl:  .  rosuvastatin (CRESTOR) 10 MG tablet, Take 10 mg by mouth at bedtime. , Disp: , Rfl:   Social History   Tobacco Use  Smoking Status Never Smoker  Smokeless Tobacco Never Used    No Known Allergies Objective:  There were no vitals filed for this visit. There is no height or weight on file to calculate BMI. Constitutional Well developed. Well nourished.  Vascular Dorsalis pedis pulses palpable bilaterally. Posterior tibial pulses palpable bilaterally. Capillary refill normal to all digits.  No cyanosis or clubbing noted. Pedal hair growth normal.  Neurologic Normal speech. Oriented to person, place, and time. Epicritic sensation to  light touch grossly present bilaterally.  Dermatologic Nails well groomed and normal in appearance. No open wounds. No skin lesions.  Orthopedic:  Adductovarus rotation of the fifth digit with hammertoe contracture fifth digit semiflexible in nature noted.  Pain on palpation to the fifth digit noted.   Radiographs: 3 views of skeletally mature adult bilateral foot: No osseous abnormalities identified.  No bunion deformity noted.  Hammertoe contracture of fifth digit with adductovarus rotation noted.  Pes planus foot structure noted as well. Assessment:   1. Hammer toes of both feet    Plan:  Patient was evaluated and treated and all questions answered.  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  I believe patient will benefit from protector to help offload the toes for now.  I also believe that patient may be an ideal surgical candidate for derotational arthroplasty.  However for now patient would like to discuss conservative options which shoe gear modification to protectors. -In the future if there is no resolve meant will consider surgical intervention.  No follow-ups on file.

## 2020-04-08 ENCOUNTER — Other Ambulatory Visit (HOSPITAL_COMMUNITY): Payer: Self-pay | Admitting: Internal Medicine

## 2020-04-08 DIAGNOSIS — Z1231 Encounter for screening mammogram for malignant neoplasm of breast: Secondary | ICD-10-CM

## 2020-04-16 ENCOUNTER — Ambulatory Visit (HOSPITAL_COMMUNITY)
Admission: RE | Admit: 2020-04-16 | Discharge: 2020-04-16 | Disposition: A | Discharge status: Home / Self Care | Payer: Medicare PPO | Source: Ambulatory Visit | Attending: Internal Medicine | Admitting: Internal Medicine

## 2020-04-16 ENCOUNTER — Other Ambulatory Visit: Payer: Self-pay

## 2020-04-16 DIAGNOSIS — Z1231 Encounter for screening mammogram for malignant neoplasm of breast: Secondary | ICD-10-CM | POA: Diagnosis not present

## 2020-04-29 ENCOUNTER — Encounter (INDEPENDENT_AMBULATORY_CARE_PROVIDER_SITE_OTHER): Payer: Self-pay | Admitting: *Deleted

## 2020-04-29 ENCOUNTER — Other Ambulatory Visit (INDEPENDENT_AMBULATORY_CARE_PROVIDER_SITE_OTHER): Payer: Self-pay | Admitting: *Deleted

## 2020-04-29 ENCOUNTER — Telehealth (INDEPENDENT_AMBULATORY_CARE_PROVIDER_SITE_OTHER): Payer: Self-pay | Admitting: *Deleted

## 2020-04-29 MED ORDER — PLENVU 140 G PO SOLR: 1.0000 | Freq: Once | ORAL | 0 refills | Status: AC

## 2020-04-29 NOTE — Telephone Encounter (Signed)
Patient needs Plenvu (copay card) ° °

## 2020-04-29 NOTE — Telephone Encounter (Signed)
Referring MD/PCP: Delman Cheadle   Procedure: tcs room 1 with BMP  Reason/Indication:  screening  Has patient had this procedure before?  Yes, 2008  If so, when, by whom and where?    Is there a family history of colon cancer?  no  Who?  What age when diagnosed?    Is patient diabetic?   yes      Does patient have prosthetic heart valve or mechanical valve?  no  Do you have a pacemaker/defibrillator?  no  Has patient ever had endocarditis/atrial fibrillation? no  Does patient use oxygen? no  Has patient had joint replacement within last 12 months?  no  Is patient constipated or do they take laxatives? no  Does patient have a history of alcohol/drug use?  no  Is patient on blood thinner such as Coumadin, Plavix and/or Aspirin? no  Medications: metformin 500 mg daily, rosuvastatin 10 mg daily, lisinopril 40 mg daily  Allergies: nkda  Medication Adjustment per Dr Rehman/Dr Jenetta Downer: hold metformin evening before and morning of  Procedure date & time: 05/27/20 at

## 2020-04-29 NOTE — Telephone Encounter (Signed)
Ok to schedule.  Thanks,  Alnita Aybar Castaneda Mayorga, MD Gastroenterology and Hepatology [facility]  

## 2020-05-09 ENCOUNTER — Other Ambulatory Visit (INDEPENDENT_AMBULATORY_CARE_PROVIDER_SITE_OTHER): Payer: Self-pay

## 2020-05-09 DIAGNOSIS — Z1211 Encounter for screening for malignant neoplasm of colon: Secondary | ICD-10-CM

## 2020-05-20 ENCOUNTER — Encounter (HOSPITAL_COMMUNITY): Payer: Self-pay | Admitting: Gastroenterology

## 2020-05-26 ENCOUNTER — Other Ambulatory Visit (HOSPITAL_COMMUNITY)
Admission: RE | Admit: 2020-05-26 | Discharge: 2020-05-26 | Disposition: A | Discharge status: Home / Self Care | Payer: Medicare PPO | Source: Ambulatory Visit | Attending: Gastroenterology | Admitting: Gastroenterology

## 2020-05-26 ENCOUNTER — Other Ambulatory Visit: Payer: Self-pay

## 2020-05-26 DIAGNOSIS — Z20822 Contact with and (suspected) exposure to covid-19: Secondary | ICD-10-CM | POA: Insufficient documentation

## 2020-05-26 DIAGNOSIS — Z01812 Encounter for preprocedural laboratory examination: Secondary | ICD-10-CM | POA: Insufficient documentation

## 2020-05-26 LAB — SARS CORONAVIRUS 2 (TAT 6-24 HRS): SARS Coronavirus 2: NEGATIVE

## 2020-05-27 ENCOUNTER — Encounter (HOSPITAL_COMMUNITY)
Admission: RE | Disposition: A | Discharge status: Home / Self Care | Payer: Self-pay | Source: Home / Self Care | Attending: Gastroenterology

## 2020-05-27 ENCOUNTER — Ambulatory Visit (HOSPITAL_COMMUNITY)
Admission: RE | Admit: 2020-05-27 | Discharge: 2020-05-27 | Disposition: A | Discharge status: Home / Self Care | Payer: Medicare PPO | Attending: Gastroenterology | Admitting: Gastroenterology

## 2020-05-27 ENCOUNTER — Encounter (HOSPITAL_COMMUNITY): Payer: Self-pay | Admitting: Gastroenterology

## 2020-05-27 ENCOUNTER — Other Ambulatory Visit: Payer: Self-pay

## 2020-05-27 ENCOUNTER — Ambulatory Visit (HOSPITAL_COMMUNITY): Payer: Medicare PPO | Admitting: Anesthesiology

## 2020-05-27 DIAGNOSIS — E7800 Pure hypercholesterolemia, unspecified: Secondary | ICD-10-CM | POA: Diagnosis not present

## 2020-05-27 DIAGNOSIS — Z1211 Encounter for screening for malignant neoplasm of colon: Secondary | ICD-10-CM

## 2020-05-27 DIAGNOSIS — K649 Unspecified hemorrhoids: Secondary | ICD-10-CM | POA: Diagnosis not present

## 2020-05-27 DIAGNOSIS — Z79899 Other long term (current) drug therapy: Secondary | ICD-10-CM | POA: Diagnosis not present

## 2020-05-27 DIAGNOSIS — E785 Hyperlipidemia, unspecified: Secondary | ICD-10-CM | POA: Insufficient documentation

## 2020-05-27 DIAGNOSIS — D122 Benign neoplasm of ascending colon: Secondary | ICD-10-CM

## 2020-05-27 DIAGNOSIS — I10 Essential (primary) hypertension: Secondary | ICD-10-CM | POA: Diagnosis not present

## 2020-05-27 DIAGNOSIS — Z09 Encounter for follow-up examination after completed treatment for conditions other than malignant neoplasm: Secondary | ICD-10-CM

## 2020-05-27 DIAGNOSIS — Z86010 Personal history of colonic polyps: Secondary | ICD-10-CM | POA: Diagnosis not present

## 2020-05-27 DIAGNOSIS — K635 Polyp of colon: Secondary | ICD-10-CM | POA: Diagnosis not present

## 2020-05-27 DIAGNOSIS — E119 Type 2 diabetes mellitus without complications: Secondary | ICD-10-CM | POA: Insufficient documentation

## 2020-05-27 DIAGNOSIS — D123 Benign neoplasm of transverse colon: Secondary | ICD-10-CM | POA: Diagnosis not present

## 2020-05-27 DIAGNOSIS — Z7984 Long term (current) use of oral hypoglycemic drugs: Secondary | ICD-10-CM | POA: Insufficient documentation

## 2020-05-27 HISTORY — PX: COLONOSCOPY WITH PROPOFOL: SHX5780

## 2020-05-27 HISTORY — PX: POLYPECTOMY: SHX5525

## 2020-05-27 LAB — GLUCOSE, CAPILLARY: Glucose-Capillary: 129 mg/dL — ABNORMAL HIGH (ref 70–99)

## 2020-05-27 LAB — HM COLONOSCOPY

## 2020-05-27 SURGERY — COLONOSCOPY WITH PROPOFOL
Anesthesia: General

## 2020-05-27 MED ORDER — CHLORHEXIDINE GLUCONATE CLOTH 2 % EX PADS: 6.0000 | MEDICATED_PAD | Freq: Once | CUTANEOUS | Status: DC

## 2020-05-27 MED ORDER — LACTATED RINGERS IV SOLN: Freq: Once | INTRAVENOUS | Status: AC

## 2020-05-27 MED ORDER — LIDOCAINE HCL (CARDIAC) PF 100 MG/5ML IV SOSY
PREFILLED_SYRINGE | INTRAVENOUS | Status: DC | PRN
  Administered 2020-05-27: 50 mg via INTRATRACHEAL

## 2020-05-27 MED ORDER — KETAMINE HCL 10 MG/ML IJ SOLN
INTRAMUSCULAR | Status: DC | PRN
  Administered 2020-05-27: 10 mg via INTRAVENOUS

## 2020-05-27 MED ORDER — KETAMINE HCL 50 MG/5ML IJ SOSY
PREFILLED_SYRINGE | INTRAMUSCULAR | Status: AC
  Filled 2020-05-27: qty 5

## 2020-05-27 MED ORDER — PROPOFOL 500 MG/50ML IV EMUL
INTRAVENOUS | Status: DC | PRN
  Administered 2020-05-27: 150 ug/kg/min via INTRAVENOUS

## 2020-05-27 MED ORDER — LACTATED RINGERS IV SOLN: INTRAVENOUS | Status: DC | PRN

## 2020-05-27 MED ORDER — GLYCOPYRROLATE 0.2 MG/ML IJ SOLN
INTRAMUSCULAR | Status: DC | PRN
  Administered 2020-05-27: .1 mg via INTRAVENOUS

## 2020-05-27 MED ORDER — PROPOFOL 10 MG/ML IV BOLUS
INTRAVENOUS | Status: DC | PRN
  Administered 2020-05-27: 30 mg via INTRAVENOUS

## 2020-05-27 NOTE — Anesthesia Preprocedure Evaluation (Addendum)
Anesthesia Evaluation  Patient identified by MRN, date of birth, ID band Patient awake    Reviewed: Allergy & Precautions, NPO status , Patient's Chart, lab work & pertinent test results  History of Anesthesia Complications (+) history of anesthetic complications  Airway Mallampati: II  TM Distance: >3 FB Neck ROM: Full    Dental  (+) Dental Advisory Given, Teeth Intact   Pulmonary neg pulmonary ROS,    Pulmonary exam normal breath sounds clear to auscultation       Cardiovascular Exercise Tolerance: Good hypertension, Pt. on medications Normal cardiovascular exam+ Valvular Problems/Murmurs  Rhythm:Regular Rate:Normal     Neuro/Psych  Headaches, negative psych ROS   GI/Hepatic GERD  Controlled,  Endo/Other  diabetes, Well Controlled, Type 2, Oral Hypoglycemic Agents  Renal/GU   negative genitourinary   Musculoskeletal negative musculoskeletal ROS (+)   Abdominal   Peds  Hematology  (+) anemia ,   Anesthesia Other Findings   Reproductive/Obstetrics negative OB ROS                            Anesthesia Physical Anesthesia Plan  ASA: II  Anesthesia Plan: General   Post-op Pain Management:    Induction: Intravenous  PONV Risk Score and Plan: Treatment may vary due to age or medical condition  Airway Management Planned: Nasal Cannula and Natural Airway  Additional Equipment:   Intra-op Plan:   Post-operative Plan:   Informed Consent: I have reviewed the patients History and Physical, chart, labs and discussed the procedure including the risks, benefits and alternatives for the proposed anesthesia with the patient or authorized representative who has indicated his/her understanding and acceptance.     Dental advisory given  Plan Discussed with: CRNA and Surgeon  Anesthesia Plan Comments:         Anesthesia Quick Evaluation

## 2020-05-27 NOTE — Discharge Instructions (Signed)
You are being discharged to home.  Resume your previous diet.  We are waiting for your pathology results.  Your physician has recommended a repeat colonoscopy for surveillance based on pathology results.   PATIENT INSTRUCTIONS POST-ANESTHESIA  IMMEDIATELY FOLLOWING SURGERY:  Do not drive or operate machinery for the first twenty four hours after surgery.  Do not make any important decisions for twenty four hours after surgery or while taking narcotic pain medications or sedatives.  If you develop intractable nausea and vomiting or a severe headache please notify your doctor immediately.  FOLLOW-UP:  Please make an appointment with your surgeon as instructed. You do not need to follow up with anesthesia unless specifically instructed to do so.  WOUND CARE INSTRUCTIONS (if applicable):  Keep a dry clean dressing on the anesthesia/puncture wound site if there is drainage.  Once the wound has quit draining you may leave it open to air.  Generally you should leave the bandage intact for twenty four hours unless there is drainage.  If the epidural site drains for more than 36-48 hours please call the anesthesia department.  QUESTIONS?:  Please feel free to call your physician or the hospital operator if you have any questions, and they will be happy to assist you.      Colonoscopy, Adult, Care After This sheet gives you information about how to care for yourself after your procedure. Your doctor may also give you more specific instructions. If you have problems or questions, call your doctor. What can I expect after the procedure? After the procedure, it is common to have:  A small amount of blood in your poop (stool) for 24 hours.  Some gas.  Mild cramping or bloating in your belly (abdomen). Follow these instructions at home: Eating and drinking   Drink enough fluid to keep your pee (urine) pale yellow.  Follow instructions from your doctor about what you cannot eat or drink.  Return  to your normal diet as told by your doctor. Avoid heavy or fried foods that are hard to digest. Activity  Rest as told by your doctor.  Do not sit for a long time without moving. Get up to take short walks every 1-2 hours. This is important. Ask for help if you feel weak or unsteady.  Return to your normal activities as told by your doctor. Ask your doctor what activities are safe for you. To help cramping and bloating:   Try walking around.  Put heat on your belly as told by your doctor. Use the heat source that your doctor recommends, such as a moist heat pack or a heating pad. ? Put a towel between your skin and the heat source. ? Leave the heat on for 20-30 minutes. ? Remove the heat if your skin turns bright red. This is very important if you are unable to feel pain, heat, or cold. You may have a greater risk of getting burned. General instructions  For the first 24 hours after the procedure: ? Do not drive or use machinery. ? Do not sign important documents. ? Do not drink alcohol. ? Do your daily activities more slowly than normal. ? Eat foods that are soft and easy to digest.  Take over-the-counter or prescription medicines only as told by your doctor.  Keep all follow-up visits as told by your doctor. This is important. Contact a doctor if:  You have blood in your poop 2-3 days after the procedure. Get help right away if:  You have more  than a small amount of blood in your poop.  You see large clumps of tissue (blood clots) in your poop.  Your belly is swollen.  You feel like you may vomit (nauseous).  You vomit.  You have a fever.  You have belly pain that gets worse, and medicine does not help your pain. Summary  After the procedure, it is common to have a small amount of blood in your poop. You may also have mild cramping and bloating in your belly.  For the first 24 hours after the procedure, do not drive or use machinery, do not sign important  documents, and do not drink alcohol.  Get help right away if you have a lot of blood in your poop, feel like you may vomit, have a fever, or have more belly pain. This information is not intended to replace advice given to you by your health care provider. Make sure you discuss any questions you have with your health care provider. Document Revised: 03/12/2019 Document Reviewed: 03/12/2019 Elsevier Patient Education  [place].   Colon Polyps  Polyps are tissue growths inside the body. Polyps can grow in many places, including the large intestine (colon). A polyp may be a round bump or a mushroom-shaped growth. You could have one polyp or several. Most colon polyps are noncancerous (benign). However, some colon polyps can become cancerous over time. Finding and removing the polyps early can help prevent this. What are the causes? The exact cause of colon polyps is not known. What increases the risk? You are more likely to develop this condition if you:  Have a family history of colon cancer or colon polyps.  Are older than 54 or older than 45 if you are African American.  Have inflammatory bowel disease, such as ulcerative colitis or Crohn's disease.  Have certain hereditary conditions, such as: ? Familial adenomatous polyposis. ? Lynch syndrome. ? Turcot syndrome. ? Peutz-Jeghers syndrome.  Are overweight.  Smoke cigarettes.  Do not get enough exercise.  Drink too much alcohol.  Eat a diet that is high in fat and red meat and low in fiber.  Had childhood cancer that was treated with abdominal radiation. What are the signs or symptoms? Most polyps do not cause symptoms. If you have symptoms, they may include:  Blood coming from your rectum when having a bowel movement.  Blood in your stool. The stool may look dark red or black.  Abdominal pain.  A change in bowel habits, such as constipation or diarrhea. How is this diagnosed? This condition is diagnosed  with a colonoscopy. This is a procedure in which a lighted, flexible scope is inserted into the anus and then passed into the colon to examine the area. Polyps are sometimes found when a colonoscopy is done as part of routine cancer screening tests. How is this treated? Treatment for this condition involves removing any polyps that are found. Most polyps can be removed during a colonoscopy. Those polyps will then be tested for cancer. Additional treatment may be needed depending on the results of testing. Follow these instructions at home: Lifestyle  Maintain a healthy weight, or lose weight if recommended by your health care provider.  Exercise every day or as told by your health care provider.  Do not use any products that contain nicotine or tobacco, such as cigarettes and e-cigarettes. If you need help quitting, ask your health care provider.  If you drink alcohol, limit how much you have: ? 0-1 drink a  day for women. ? 0-2 drinks a day for men.  Be aware of how much alcohol is in your drink. In the [place] one drink equals one 12 oz bottle of beer (355 mL), one 5 oz glass of wine (148 mL), or one 1 oz shot of hard liquor (44 mL). Eating and drinking   Eat foods that are high in fiber, such as fruits, vegetables, and whole grains.  Eat foods that are high in calcium and vitamin D, such as milk, cheese, yogurt, eggs, liver, fish, and broccoli.  Limit foods that are high in fat, such as fried foods and desserts.  Limit the amount of red meat and processed meat you eat, such as hot dogs, sausage, bacon, and lunch meats. General instructions  Keep all follow-up visits as told by your health care provider. This is important. ? This includes having regularly scheduled colonoscopies. ? Talk to your health care provider about when you need a colonoscopy. Contact a health care provider if:  You have new or worsening bleeding during a bowel movement.  You have new or increased blood in  your stool.  You have a change in bowel habits.  You lose weight for no known reason. Summary  Polyps are tissue growths inside the body. Polyps can grow in many places, including the colon.  Most colon polyps are noncancerous (benign), but some can become cancerous over time.  This condition is diagnosed with a colonoscopy.  Treatment for this condition involves removing any polyps that are found. Most polyps can be removed during a colonoscopy. This information is not intended to replace advice given to you by your health care provider. Make sure you discuss any questions you have with your health care provider. Document Revised: 12/01/2017 Document Reviewed: 12/01/2017 Elsevier Patient Education  [place].

## 2020-05-27 NOTE — Anesthesia Postprocedure Evaluation (Signed)
Anesthesia Post Note  Patient: Ann Vasquez  Procedure(s) Performed: COLONOSCOPY WITH PROPOFOL (N/A ) POLYPECTOMY  Patient location during evaluation: PACU Anesthesia Type: General Level of consciousness: awake Pain management: pain level controlled Vital Signs Assessment: post-procedure vital signs reviewed and stable Respiratory status: spontaneous breathing, nonlabored ventilation and respiratory function stable Cardiovascular status: stable Postop Assessment: no apparent nausea or vomiting Anesthetic complications: no   No complications documented.   Last Vitals:  Vitals:   05/27/20 0722 05/27/20 0915  BP: (!) 143/72 110/62  Pulse:  65  Resp: (!) 23 14  Temp: 36.7 C 36.7 C  SpO2: 99% 97%    Last Pain:  Vitals:   05/27/20 0915  TempSrc: Oral  PainSc: 0-No pain                 Brasen Bundren Hristova

## 2020-05-27 NOTE — Op Note (Signed)
[facility] Patient Name: Ann Vasquez Procedure Date: 05/27/2020 8:22 AM MRN: [id number] Date of Birth: 1946-05-19 Attending MD: Maylon Peppers ,  CSN: [id number] Age: 74 Admit Type: Outpatient Procedure:                Colonoscopy Indications:              High risk colon cancer surveillance: Personal                            history of colonic polyps Providers:                Maylon Peppers, [place] Sharon Seller, RN, Casimer Bilis, Technician Referring MD:              Medicines:                Monitored Anesthesia Care Complications:            No immediate complications. Estimated Blood Loss:     Estimated blood loss: none. Procedure:                Pre-Anesthesia Assessment:                           - Prior to the procedure, a History and Physical                            was performed, and patient medications, allergies                            and sensitivities were reviewed. The patient's                            tolerance of previous anesthesia was reviewed.                           - The risks and benefits of the procedure and the                            sedation options and risks were discussed with the                            patient. All questions were answered and informed                            consent was obtained.                           - ASA Grade Assessment: II - A patient with mild                            systemic disease.                           After obtaining informed consent, the colonoscope  was passed under direct vision. Throughout the                            procedure, the patient's blood pressure, pulse, and                            oxygen saturations were monitored continuously. The                            [id number]-[id number] ([id number]) scope was introduced through                            the anus and advanced to the the cecum, identified                             by appendiceal orifice and ileocecal valve. The                            colonoscopy was performed without difficulty. The                            patient tolerated the procedure well. The quality                            of the bowel preparation was excellent. Scope                            withdrawal time was 10 minutes. Scope In: 8:45:52 AM Scope Out: 9:06:34 AM Scope Withdrawal Time: 0 hours 12 minutes 43 seconds  Total Procedure Duration: 0 hours 20 minutes 42 seconds  Findings:      Hemorrhoids were found on perianal exam.      Two sessile polyps were found in the ascending colon. The polyps were 4       to 5 mm in size. These polyps were removed with a cold snare. Resection       and retrieval were complete.      Two sessile polyps were found in the transverse colon. The polyps were 3       to 5 mm in size. These polyps were removed with a cold snare. Resection       and retrieval were complete.      The retroflexed view of the distal rectum and anal verge was normal and       showed no anal or rectal abnormalities. Impression:               - Hemorrhoids found on perianal exam.                           - Two 4 to 5 mm polyps in the ascending colon,                            removed with a cold snare. Resected and retrieved.                           - Two 3 to  5 mm polyps in the transverse colon,                            removed with a cold snare. Resected and retrieved.                           - The distal rectum and anal verge are normal on                            retroflexion view. Moderate Sedation:      Per Anesthesia Care Recommendation:           - Discharge patient to home (ambulatory).                           - Resume previous diet.                           - Await pathology results.                           - Repeat colonoscopy for surveillance based on                            pathology results. Procedure Code(s):        ---  Professional ---                           [phone], GC, Colonoscopy, flexible; with removal of                            tumor(s), polyp(s), or other lesion(s) by snare                            technique Diagnosis Code(s):        --- Professional ---                           Z86.010, Personal history of colonic polyps                           K63.5, Polyp of colon                           K64.9, Unspecified hemorrhoids CPT copyright 2019 American Medical Association. All rights reserved. The codes documented in this report are preliminary and upon coder review may  be revised to meet current compliance requirements. Maylon Peppers, MD Maylon Peppers,  05/27/2020 9:12:34 AM This report has been signed electronically. Number of Addenda: 0

## 2020-05-27 NOTE — Transfer of Care (Signed)
Immediate Anesthesia Transfer of Care Note  Patient: Ann Vasquez  Procedure(s) Performed: COLONOSCOPY WITH PROPOFOL (N/A ) POLYPECTOMY  Patient Location: PACU  Anesthesia Type:General  Level of Consciousness: awake  Airway & Oxygen Therapy: Patient Spontanous Breathing  Post-op Assessment: Report given to RN and Post -op Vital signs reviewed and stable  Post vital signs: Reviewed and stable  Last Vitals:  Vitals Value Taken Time  BP    Temp    Pulse    Resp    SpO2      Last Pain:  Vitals:   05/27/20 0843  TempSrc:   PainSc: 0-No pain      Patients Stated Pain Goal: 10 (36/85/99 2341)  Complications: No complications documented.

## 2020-05-27 NOTE — H&P (Signed)
Ann Vasquez is an 74 y.o. female.   Chief Complaint: screening colonoscopy and history of colon polyps HPI: 74 y/o F with Past medical history of diabetes, hypertension, hyperlipidemia and anemia, , who comes to the hospital to undergo screening colonoscopy.  The patient  had a colonoscopy in 2006 , reports had one polyp.  She denies having any complaints such as melena, hematochezia, abdominal pain or distention, change in her bowel movement consistency or frequency, no changes in her weight recently.  No family history of colorectal cancer.   Past Medical History:  Diagnosis Date  . Anemia   . Cellulitis and abscess of leg    extends to foot  . Diabetes mellitus type 2, diet-controlled ([place])   . Frequent headaches   . Heart murmur   . High cholesterol   . HTN (hypertension)   . Hyperlipidemia     Past Surgical History:  Procedure Laterality Date  . EYE SURGERY Right    detached retina  . right shoulder  2011   rotator cuff repair, Aline Brochure, [facility]  . SHOULDER OPEN ROTATOR CUFF REPAIR  10/29/2011   Procedure: left ROTATOR CUFF REPAIR SHOULDER OPEN;  Surgeon: Arther Abbott, MD;  Location: [facility];  Service: Orthopedics;  Laterality: Left;    Family History  Problem Relation Age of Onset  . Anesthesia problems Neg Hx   . Malignant hyperthermia Neg Hx   . Hypotension Neg Hx   . Pseudochol deficiency Neg Hx    Social History:  reports that she has never smoked. She has never used smokeless tobacco. She reports that she does not drink alcohol and does not use drugs.  Allergies: No Known Allergies  Medications Prior to Admission  Medication Sig Dispense Refill  . amLODipine-valsartan (EXFORGE) 5-320 MG tablet Take 0.5 tablets by mouth daily.     . metFORMIN (GLUCOPHAGE-XR) 500 MG 24 hr tablet Take 500 mg by mouth every morning.    . rosuvastatin (CRESTOR) 10 MG tablet Take 10 mg by mouth at bedtime.       Results for orders placed or performed during the hospital  encounter of 05/27/20 (from the past 48 hour(s))  Glucose, capillary     Status: Abnormal   Collection Time: 05/27/20  7:31 AM  Result Value Ref Range   Glucose-Capillary 129 (H) 70 - 99 mg/dL    Comment: Glucose reference range applies only to samples taken after fasting for at least 8 hours.   No results found.  Review of Systems  Constitutional: Negative.   HENT: Negative.   Eyes: Negative.   Respiratory: Negative.   Cardiovascular: Negative.   Gastrointestinal: Negative.   Endocrine: Negative.   Genitourinary: Negative.   Musculoskeletal: Negative.   Skin: Negative.   Allergic/Immunologic: Negative.   Neurological: Negative.   Hematological: Negative.   Psychiatric/Behavioral: Negative.     Blood pressure (!) 143/72, temperature 98.1 F (36.7 C), temperature source Oral, resp. rate (!) 23, height 4\' 11"  (1.499 m), weight 64.4 kg, SpO2 99 %. Physical Exam  GENERAL: The patient is AO x3, in no acute distress. HEENT: Head is normocephalic and atraumatic. EOMI are intact. Mouth is well hydrated and without lesions. NECK: Supple. No masses LUNGS: Clear to auscultation. No presence of rhonchi/wheezing/rales. Adequate chest expansion HEART: RRR, normal s1 and s2. ABDOMEN: Soft, nontender, no guarding, no peritoneal signs, and nondistended. BS +. No masses. EXTREMITIES: Without any cyanosis, clubbing, rash, lesions or edema. NEUROLOGIC: AOx3, no focal motor deficit. SKIN: no jaundice, no rashes  Assessment/Plan 74 y/o F with Past medical history of diabetes, hypertension, hyperlipidemia and anemia, , who comes to the hospital to undergo screening colonoscopy.  Patient had a history of previous colon polyps..  We will proceed with colonoscopy for surveillance of colon polyps.  [name], MD 05/27/2020, 8:39 AM

## 2020-05-28 LAB — SURGICAL PATHOLOGY

## 2020-06-02 ENCOUNTER — Encounter (HOSPITAL_COMMUNITY): Payer: Self-pay | Admitting: Gastroenterology

## 2020-06-18 ENCOUNTER — Emergency Department (HOSPITAL_COMMUNITY): Payer: Medicare PPO

## 2020-06-18 ENCOUNTER — Encounter (HOSPITAL_COMMUNITY): Payer: Self-pay

## 2020-06-18 ENCOUNTER — Emergency Department (HOSPITAL_COMMUNITY)
Admission: EM | Admit: 2020-06-18 | Discharge: 2020-06-18 | Disposition: A | Discharge status: Home / Self Care | Payer: Medicare PPO | Attending: Emergency Medicine | Admitting: Emergency Medicine

## 2020-06-18 ENCOUNTER — Other Ambulatory Visit: Payer: Self-pay

## 2020-06-18 DIAGNOSIS — Z7984 Long term (current) use of oral hypoglycemic drugs: Secondary | ICD-10-CM | POA: Insufficient documentation

## 2020-06-18 DIAGNOSIS — M160 Bilateral primary osteoarthritis of hip: Secondary | ICD-10-CM | POA: Diagnosis not present

## 2020-06-18 DIAGNOSIS — M545 Low back pain, unspecified: Secondary | ICD-10-CM | POA: Diagnosis not present

## 2020-06-18 DIAGNOSIS — I10 Essential (primary) hypertension: Secondary | ICD-10-CM | POA: Insufficient documentation

## 2020-06-18 DIAGNOSIS — R202 Paresthesia of skin: Secondary | ICD-10-CM | POA: Diagnosis not present

## 2020-06-18 DIAGNOSIS — Z79899 Other long term (current) drug therapy: Secondary | ICD-10-CM | POA: Insufficient documentation

## 2020-06-18 DIAGNOSIS — E119 Type 2 diabetes mellitus without complications: Secondary | ICD-10-CM | POA: Insufficient documentation

## 2020-06-18 MED ORDER — PREDNISONE 50 MG PO TABS: ORAL_TABLET | ORAL | 0 refills | Status: DC

## 2020-06-18 MED ORDER — METHOCARBAMOL 500 MG PO TABS: 500.0000 mg | ORAL_TABLET | Freq: Four times a day (QID) | ORAL | 0 refills | Status: DC

## 2020-06-18 NOTE — Discharge Instructions (Addendum)
Return if any problems.

## 2020-06-18 NOTE — ED Triage Notes (Signed)
Pt to er, pt states that three weeks ago she bent over and had a sudden onset of back pain, states that since then she has had off and on back pain, states that movement makes the pain worse and it is relieved by rest. Reports some occasional tingling in her upper legs.

## 2020-06-19 NOTE — ED Provider Notes (Signed)
[facility] EMERGENCY DEPARTMENT Provider Note   CSN: [id number] Arrival date & time: 06/18/20  7829     History Chief Complaint  Patient presents with  . Back Pain    Ann Vasquez is a 74 y.o. female.  The history is provided by the patient. No language interpreter was used.  Back Pain Location:  Lumbar spine Quality:  Aching Radiates to:  Does not radiate Pain severity:  Moderate Pain is:  Same all the time Duration:  3 weeks Timing:  Constant Progression:  Worsening Chronicity:  New Relieved by:  Nothing Worsened by:  Nothing Ineffective treatments:  None tried Associated symptoms: no fever   Pt reports she has pain in her back after bending over 3 weeks ago.       Past Medical History:  Diagnosis Date  . Anemia   . Cellulitis and abscess of leg    extends to foot  . Diabetes mellitus type 2, diet-controlled ([place])   . Frequent headaches   . Heart murmur   . High cholesterol   . HTN (hypertension)   . Hyperlipidemia     Patient Active Problem List   Diagnosis Date Noted  . De Quervain's disease (radial styloid tenosynovitis) 08/08/2012  . Shoulder pain 11/17/2011  . Shoulder stiffness 11/17/2011  . Muscle weakness (generalized) 11/17/2011  . S/P shoulder surgery 11/01/2011  . Biceps tendon rupture, proximal 10/25/2011  . Rotator cuff tear, left 10/25/2011  . Rotator cuff tear 10/05/2011  . Bursitis of shoulder, left 09/28/2011  . Back pain 01/26/2011  . IMPINGEMENT SYNDROME 09/29/2009  . RUPTURE ROTATOR CUFF 09/29/2009  . FIBROIDS, UTERUS 06/27/2009  . DM 06/27/2009  . HYPERLIPIDEMIA 06/27/2009  . ANEMIA, IRON DEFICIENCY, MICROCYTIC 06/27/2009  . GERD 06/27/2009    Past Surgical History:  Procedure Laterality Date  . COLONOSCOPY WITH PROPOFOL N/A 05/27/2020   Procedure: COLONOSCOPY WITH PROPOFOL;  Surgeon: [name], MD;  Location: [facility];  Service: Gastroenterology;  Laterality: N/A;  815  . EYE SURGERY Right      detached retina  . POLYPECTOMY  05/27/2020   Procedure: POLYPECTOMY;  Surgeon: [name], MD;  Location: [facility];  Service: Gastroenterology;;  . right shoulder  2011   rotator cuff repair, Aline Brochure, [facility]  . SHOULDER OPEN ROTATOR CUFF REPAIR  10/29/2011   Procedure: left ROTATOR CUFF REPAIR SHOULDER OPEN;  Surgeon: Arther Abbott, MD;  Location: [facility];  Service: Orthopedics;  Laterality: Left;     OB History   No obstetric history on file.     Family History  Problem Relation Age of Onset  . Anesthesia problems Neg Hx   . Malignant hyperthermia Neg Hx   . Hypotension Neg Hx   . Pseudochol deficiency Neg Hx     Social History   Tobacco Use  . Smoking status: Never Smoker  . Smokeless tobacco: Never Used  Substance Use Topics  . Alcohol use: No  . Drug use: No    Home Medications Prior to Admission medications   Medication Sig Start Date End Date Taking? Authorizing Provider  amLODipine-valsartan (EXFORGE) 5-320 MG tablet Take 0.5 tablets by mouth daily.  02/03/20   [provider]  metFORMIN (GLUCOPHAGE-XR) 500 MG 24 hr tablet Take 500 mg by mouth every morning. 03/21/20   [provider]  methocarbamol (ROBAXIN) 500 MG tablet Take 1 tablet (500 mg total) by mouth 4 (four) times daily. 06/18/20   Fransico Meadow, PA-C  predniSONE (DELTASONE) 50  MG tablet One tablet a day 06/18/20   Caryl Ada K, PA-C  rosuvastatin (CRESTOR) 10 MG tablet Take 10 mg by mouth at bedtime.     [provider]    Allergies    Patient has no known allergies.  Review of Systems   Review of Systems  Constitutional: Negative for fever.  Musculoskeletal: Positive for back pain.  All other systems reviewed and are negative.   Physical Exam Updated Vital Signs BP (!) 164/60   Pulse (!) 53   Temp 97.9 F (36.6 C) (Oral)   Resp 18   Ht 4\' 11"  (1.499 m)   Wt 64.4 kg   SpO2 98%   BMI 28.68 kg/m   Physical Exam Vitals and nursing note  reviewed.  Constitutional:      Appearance: She is well-developed.  HENT:     Head: Normocephalic.  Cardiovascular:     Rate and Rhythm: Normal rate.  Pulmonary:     Effort: Pulmonary effort is normal.  Musculoskeletal:        General: Tenderness present. Normal range of motion.     Cervical back: Normal range of motion.     Comments: Tender lower lumbar area, pain with movement  dtr's normal    Skin:    General: Skin is warm.  Neurological:     General: No focal deficit present.     Mental Status: She is alert and oriented to person, place, and time.  Psychiatric:        Mood and Affect: Mood normal.     ED Results / Procedures / Treatments   Labs (all labs ordered are listed, but only abnormal results are displayed) Labs Reviewed - No data to display  EKG None  Radiology DG Lumbar Spine Complete  Result Date: 06/18/2020 CLINICAL DATA:  Low back pain for several weeks. History of diabetes and hypertension. EXAM: LUMBAR SPINE - COMPLETE 4+ VIEW COMPARISON:  Radiographs 03/10/2013. FINDINGS: The bones appear mildly demineralized. There are 5 lumbar type vertebral bodies. No evidence of acute fracture or traumatic subluxation. There is a mild convex right scoliosis with interval disc space narrowing and a grade 1 anterolisthesis at L4-5. The additional disc spaces are largely preserved. There are mild facet degenerative changes inferiorly. IMPRESSION: Interval worsening of L4-5 spondylosis with grade 1 anterolisthesis. No acute osseous findings. Electronically Signed   By: Richardean Sale M.D.   On: 06/18/2020 13:23   DG Pelvis 1-2 Views  Result Date: 06/18/2020 CLINICAL DATA:  Low back pain for several weeks. Occasional tingling in the upper legs. EXAM: PELVIS - 1-2 VIEW COMPARISON:  Radiographs of the sacroiliac joints 03/10/2013. FINDINGS: The bones appear mildly demineralized. There is no evidence of acute fracture or dislocation. Stable minimal hip joint space narrowing  bilaterally. The sacroiliac joints appear normal. There is mild osteitis pubis. Left pelvic calcifications are similar to previous study and likely vascular. IMPRESSION: Stable minimal hip degenerative changes. No acute osseous findings. Electronically Signed   By: Richardean Sale M.D.   On: 06/18/2020 13:24    Procedures Procedures (including critical care time)  Medications Ordered in ED Medications - No data to display  ED Course  I have reviewed the triage vital signs and the nursing notes.  Pertinent labs & imaging results that were available during my care of the patient were reviewed by me and considered in my medical decision making (see chart for details).    MDM Rules/Calculators/A&P  MDM:  Pt counseled on spondylitises and need for follow up  Final Clinical Impression(s) / ED Diagnoses Final diagnoses:  Acute low back pain without sciatica, unspecified back pain laterality    Rx / DC Orders ED Discharge Orders         Ordered    predniSONE (DELTASONE) 50 MG tablet        06/18/20 1423    methocarbamol (ROBAXIN) 500 MG tablet  4 times daily        06/18/20 1423        An After Visit Summary was printed and given to the patient.    Fransico Meadow, [place] 06/19/20 1029    Hayden Rasmussen, MD 06/19/20 1246

## 2020-07-03 ENCOUNTER — Encounter: Payer: Self-pay | Admitting: Orthopedic Surgery

## 2020-07-03 ENCOUNTER — Ambulatory Visit: Payer: Medicare PPO | Admitting: Orthopedic Surgery

## 2020-07-03 ENCOUNTER — Other Ambulatory Visit: Payer: Self-pay

## 2020-07-03 VITALS — BP 153/77 | HR 78 | Ht 59.0 in | Wt 139.0 lb

## 2020-07-03 DIAGNOSIS — M545 Low back pain, unspecified: Secondary | ICD-10-CM

## 2020-07-03 DIAGNOSIS — G8929 Other chronic pain: Secondary | ICD-10-CM

## 2020-07-03 DIAGNOSIS — M5136 Other intervertebral disc degeneration, lumbar region: Secondary | ICD-10-CM

## 2020-07-03 MED ORDER — METHOCARBAMOL 500 MG PO TABS: 500.0000 mg | ORAL_TABLET | Freq: Three times a day (TID) | ORAL | 1 refills | Status: DC

## 2020-07-03 MED ORDER — MELOXICAM 7.5 MG PO TABS: 7.5000 mg | ORAL_TABLET | Freq: Every day | ORAL | 5 refills | Status: DC

## 2020-07-03 NOTE — Patient Instructions (Addendum)
Arrange physical therapy    Chronic Back Pain When back pain lasts longer than 3 months, it is called chronic back pain. Pain may get worse at certain times (flare-ups). There are things you can do at home to manage your pain. Follow these instructions at home: Activity      Avoid bending and other activities that make pain worse.  When standing: ? Keep your upper back and neck straight. ? Keep your shoulders pulled back. ? Avoid slouching.  When sitting: ? Keep your back straight. ? Relax your shoulders. Do not round your shoulders or pull them backward.  Do not sit or stand in one place for long periods of time.  Take short rest breaks during the day. Lying down or standing is usually better than sitting. Resting can help relieve pain.  When sitting or lying down for a long time, do some mild activity or stretching. This will help to prevent stiffness and pain.  Get regular exercise. Ask your doctor what activities are safe for you.  Do not lift anything that is heavier than 10 lb (4.5 kg). To prevent injury when you lift things: ? Bend your knees. ? Keep the weight close to your body. ? Avoid twisting. Managing pain  If told, put ice on the painful area. Your doctor may tell you to use ice for 24-48 hours after a flare-up starts. ? Put ice in a plastic bag. ? Place a towel between your skin and the bag. ? Leave the ice on for 20 minutes, 2-3 times a day.  If told, put heat on the painful area as often as told by your doctor. Use the heat source that your doctor recommends, such as a moist heat pack or a heating pad. ? Place a towel between your skin and the heat source. ? Leave the heat on for 20-30 minutes. ? Remove the heat if your skin turns bright red. This is especially important if you are unable to feel pain, heat, or cold. You may have a greater risk of getting burned.  Soak in a warm bath. This can help relieve pain.  Take over-the-counter and prescription  medicines only as told by your doctor. General instructions  Sleep on a firm mattress. Try lying on your side with your knees slightly bent. If you lie on your back, put a pillow under your knees.  Keep all follow-up visits as told by your doctor. This is important. Contact a doctor if:  You have pain that does not get better with rest or medicine. Get help right away if:  One or both of your arms or legs feel weak.  One or both of your arms or legs lose feeling (numbness).  You have trouble controlling when you poop (bowel movement) or pee (urinate).  You feel sick to your stomach (nauseous).  You throw up (vomit).  You have belly (abdominal) pain.  You have shortness of breath.  You pass out (faint). Summary  When back pain lasts longer than 3 months, it is called chronic back pain.  Pain may get worse at certain times (flare-ups).  Use ice and heat as told by your doctor. Your doctor may tell you to use ice after flare-ups. This information is not intended to replace advice given to you by your health care provider. Make sure you discuss any questions you have with your health care provider. Document Revised: 12/07/2018 Document Reviewed: 03/31/2017 Elsevier Patient Education  2020 [employer].

## 2020-07-03 NOTE — Progress Notes (Signed)
NEW PROBLEM//OFFICE VISIT  Chief Complaint  Patient presents with   Back Pain    since September has had xrays     74 year old female with history of lower back pain which started in September 3 week of September on the 19th.  She bent over to pick something up and felt a snap in her back went to the ER had x-rays.  She complains of lower back pain no leg pain no red flags she is diabetic she took Robaxin and prednisone and got a little bit of relief but still symptomatic interfering with her activities of daily living   Review of Systems  Gastrointestinal: Positive for heartburn.  Musculoskeletal: Positive for back pain and myalgias.     Past Medical History:  Diagnosis Date   Anemia    Cellulitis and abscess of leg    extends to foot   Diabetes mellitus type 2, diet-controlled ([facility])    Frequent headaches    Heart murmur    High cholesterol    HTN (hypertension)    Hyperlipidemia     Past Surgical History:  Procedure Laterality Date   COLONOSCOPY WITH PROPOFOL N/A 05/27/2020   Procedure: COLONOSCOPY WITH PROPOFOL;  Surgeon: [name], MD;  [facility]: [facility];  Service: Gastroenterology;  Laterality: N/A;  815   EYE SURGERY Right    detached retina   POLYPECTOMY  05/27/2020   Procedure: POLYPECTOMY;  Surgeon: [name], MD;  Location: [facility];  Service: Gastroenterology;;   right shoulder  2011   rotator cuff repair, Aline Brochure, [facility]   SHOULDER OPEN ROTATOR CUFF REPAIR  10/29/2011   Procedure: left ROTATOR CUFF REPAIR SHOULDER OPEN;  Surgeon: Arther Abbott, MD;  Location: [facility];  Service: Orthopedics;  Laterality: Left;    Family History  Problem Relation Age of Onset   Cancer Paternal Grandmother    Anesthesia problems Neg Hx    Malignant hyperthermia Neg Hx    Hypotension Neg Hx    Pseudochol deficiency Neg Hx    Social History   Tobacco Use   Smoking status: Never Smoker   Smokeless tobacco: Never  Used  Substance Use Topics   Alcohol use: No   Drug use: No    No Known Allergies  Current Meds  Medication Sig   amLODipine-valsartan (EXFORGE) 5-320 MG tablet Take 0.5 tablets by mouth daily.    metFORMIN (GLUCOPHAGE-XR) 500 MG 24 hr tablet Take 500 mg by mouth every morning.   rosuvastatin (CRESTOR) 10 MG tablet Take 10 mg by mouth at bedtime.     BP (!) 153/77    Pulse 78    Ht 4\' 11"  (1.499 m)    Wt 139 lb (63 kg)    BMI 28.07 kg/m   Physical Exam Vitals and nursing note reviewed.  Constitutional:      Appearance: Normal appearance.  Neurological:     Mental Status: She is alert and oriented to person, place, and time.  Psychiatric:        Mood and Affect: Mood normal.     Ortho Exam  Left and right lower extremity strength motor exam normal bilaterally Neurologic exam normal bilaterally Hip joints are negative for any reactive pain on range of motion No tenderness in the lower extremities  Tenderness in the lower back midline right and left    MEDICAL DECISION MAKING  A.  Encounter Diagnoses  Name Primary?   DDD (degenerative disc disease), lumbar    Chronic midline low back  pain without sciatica Yes    B. DATA ANALYSED:   IMAGING: Interpretation of images: First image for interpretation is the AP pelvis AP pelvis is normal  Second image for interpretation of lumbar spine dated October 25 views patient has L4-L5 degenerative disc changes and grade 1 spondylolisthesis   Orders: Physical therapy as an outpatient  Outside records reviewed: ER record  CLINICAL DATA:  Low back pain for several weeks. History of diabetes and hypertension.   EXAM: LUMBAR SPINE - COMPLETE 4+ VIEW   COMPARISON:  Radiographs 03/10/2013.   FINDINGS: The bones appear mildly demineralized. There are 5 lumbar type vertebral bodies. No evidence of acute fracture or traumatic subluxation. There is a mild convex right scoliosis with interval disc space narrowing  and a grade 1 anterolisthesis at L4-5. The additional disc spaces are largely preserved. There are mild facet degenerative changes inferiorly.   IMPRESSION: Interval worsening of L4-5 spondylosis with grade 1 anterolisthesis. No acute osseous findings.     Electronically Signed   By: Richardean Sale M.D.   On: 06/18/2020 13:23   CLINICAL DATA:  Low back pain for several weeks. Occasional tingling in the upper legs.   EXAM: PELVIS - 1-2 VIEW   COMPARISON:  Radiographs of the sacroiliac joints 03/10/2013.   FINDINGS: The bones appear mildly demineralized. There is no evidence of acute fracture or dislocation. Stable minimal hip joint space narrowing bilaterally. The sacroiliac joints appear normal. There is mild osteitis pubis. Left pelvic calcifications are similar to previous study and likely vascular.   IMPRESSION: Stable minimal hip degenerative changes. No acute osseous findings.     Electronically Signed   By: Richardean Sale M.D.   On: 06/18/2020 13:24    Result History  DG Pelvis 1-2 Views (Order #[id number]) on 06/18/2020 - Order Result History Report MyChart Results Release  MyChart Status: Code Expired  Results Release  Encounter-Level Documents - 06/18/2020:  Document on 06/22/2020 2:39 PM by Turner Daniels, RN: ED PB Billing Extract Electronic signature on 06/18/2020 2:34 PM - E-signed Document on 06/18/2020 2:24 PM by Fransico Meadow, PA-C: ED After Visit Summary Electronic signature on 06/18/2020 12:57 PM - 1 of 3 e-signatures recorded      Order-Level Documents:  There are no order-level documents. Hospital account-Level Documents:  There are no hospital account-level documents. Vitals  Height Weight BMI (Calculated)  4\' 11"  (1.499 m) 139 lb (63 kg) 28.06  Imaging  Imaging Information  DG Pelvis 1-2 Views: Patient Communication  Add Comments  Not seen  Resulted by:  Signed Date/Time  Phone Pager  Richardean Sale 06/18/2020  1:24 PM  [phone]  Study Notes   Nani Ravens, RT on 06/18/2020  1:17 PM  Pt states she has had lower back pain onset for several weeks. Pt states that it hurts worse when she exerts herself. Pt denies any injury or surgery to lower back. Hx of diabetes and hypertension.    Er notes: pt states that three weeks ago she bent over and had a sudden onset of back pain, states that since then she has had off and on back pain, states that movement makes the pain worse and it is relieved by rest. Reports some occasional tingling in her upper legs  Original Order  Ordered On Ordered By   06/18/2020 12:59 PM Fransico Meadow, PA-C        External Result Report  External Result Report    C. MANAGEMENT   Recommend physical  therapy  Continue Robaxin and continue or start meloxicam follow-up after 6 to 8 weeks  Chronic problem at this point will have at least a year or more/medication management/  Meds ordered this encounter  Medications   meloxicam (MOBIC) 7.5 MG tablet    Sig: Take 1 tablet (7.5 mg total) by mouth daily.    Dispense:  30 tablet    Refill:  5   methocarbamol (ROBAXIN) 500 MG tablet    Sig: Take 1 tablet (500 mg total) by mouth 3 (three) times daily.    Dispense:  60 tablet    Refill:  1      Arther Abbott, MD  07/03/2020 9:09 AM

## 2020-07-03 NOTE — Addendum Note (Signed)
Addended byCandice Camp on: 07/03/2020 09:23 AM   Modules accepted: Orders

## 2020-07-16 ENCOUNTER — Ambulatory Visit (HOSPITAL_COMMUNITY): Payer: Medicare PPO | Attending: Orthopedic Surgery | Admitting: Physical Therapy

## 2020-07-16 ENCOUNTER — Other Ambulatory Visit: Payer: Self-pay

## 2020-07-16 ENCOUNTER — Encounter (HOSPITAL_COMMUNITY): Payer: Self-pay | Admitting: Physical Therapy

## 2020-07-16 DIAGNOSIS — R29898 Other symptoms and signs involving the musculoskeletal system: Secondary | ICD-10-CM | POA: Diagnosis not present

## 2020-07-16 DIAGNOSIS — M545 Low back pain, unspecified: Secondary | ICD-10-CM | POA: Insufficient documentation

## 2020-07-16 DIAGNOSIS — R2689 Other abnormalities of gait and mobility: Secondary | ICD-10-CM | POA: Diagnosis not present

## 2020-07-16 DIAGNOSIS — M6281 Muscle weakness (generalized): Secondary | ICD-10-CM | POA: Diagnosis not present

## 2020-07-16 NOTE — Therapy (Signed)
Ann Vasquez, [place], [address] Phone: [phone]   Fax:  [phone]  Physical Therapy Evaluation  Patient Details  Name: Ann Vasquez MRN: [id number] Date of Birth: Jul 18, 1946 Referring Provider (PT): Arther Abbott MD   Encounter Date: 07/16/2020   PT End of Session - 07/16/20 1219    Visit Number 1    Number of Visits 12    Date for PT Re-Evaluation 08/27/20    Authorization Type [employer]    Authorization Time Period 12 visits requested 11/17-12/29 - check auth    Progress Note Due on Visit 10    PT Start Time 1134    PT Stop Time 1217    PT Time Calculation (min) 43 min    Activity Tolerance Patient tolerated treatment well    Behavior During Therapy [facility] for tasks assessed/performed           Past Medical History:  Diagnosis Date  . Anemia   . Cellulitis and abscess of leg    extends to foot  . Diabetes mellitus type 2, diet-controlled (Ann Vasquez)   . Frequent headaches   . Heart murmur   . High cholesterol   . HTN (hypertension)   . Hyperlipidemia     Past Surgical History:  Procedure Laterality Date  . COLONOSCOPY WITH PROPOFOL N/A 05/27/2020   Procedure: COLONOSCOPY WITH PROPOFOL;  Surgeon: [name], MD;  [facility];  Service: Gastroenterology;  Laterality: N/A;  815  . EYE SURGERY Right    detached retina  . POLYPECTOMY  05/27/2020   Procedure: POLYPECTOMY;  Surgeon: [name], MD;  Location: [facility];  Service: Gastroenterology;;  . right shoulder  2011   rotator cuff repair, Aline Brochure, [facility]  . SHOULDER OPEN ROTATOR CUFF REPAIR  10/29/2011   Procedure: left ROTATOR CUFF REPAIR SHOULDER OPEN;  Surgeon: Arther Abbott, MD;  Location: [facility];  Service: Orthopedics;  Laterality: Left;    There were no vitals filed for this visit.    Subjective Assessment - 07/16/20 1142    Subjective Patient is a 74 y.o. female who presents to  physical therapy with c/o low back pain. Patient states the week of September 19 she bent to reach for her gloves and felt a pop in her back. Pain gradually improved over the next couple days but then started to get painful again. Pain increases with bending, sweeping, sitting, standing, household chores. Symptoms are decreased with changing positions. She denies history of back pain. She has pain in the upper part of her back/neck intermittently. Her main goal for physical therapy is to decrease her pain. She states increased frequency with going to the bathroom. She denies numbness tingling in groin/LE.    Limitations House hold activities;Standing;Sitting;Walking    How long can you walk comfortably? 100 feet    Patient Stated Goals decrease pain    Currently in Pain? Yes    Pain Score 1    worst 6/10   Pain Location Back    Pain Descriptors / Indicators Other (Comment)   uncomfortable   Pain Type Acute pain    Pain Onset More than a month ago              [facility] PT Assessment - 07/16/20 0001      Assessment   Medical Diagnosis DDD, LBP    Referring Provider (PT) Arther Abbott MD    Onset Date/Surgical Date 05/18/20    Next  MD Visit End of December    Prior Therapy Shoulder      Precautions   Precautions None      Restrictions   Weight Bearing Restrictions No      Balance Screen   Has the patient fallen in the past 6 months No    Has the patient had a decrease in activity level because of a fear of falling?  No    Is the patient reluctant to leave their home because of a fear of falling?  No      Prior Function   Level of Independence Independent    Vocation Retired      Charity fundraiser Status Within Functional Limits for tasks assessed      Observation/Other Assessments   Observations Ambulates without AD    Focus on Therapeutic Outcomes (FOTO)  41% limited      Sensation   Light Touch Appears Intact      ROM / Strength   AROM / PROM / Strength  AROM;Strength      AROM   Overall AROM Comments worst pain with extension    AROM Assessment Site Lumbar    Lumbar Flexion 25% limited     Lumbar Extension 50% limited    Lumbar - Right Side Bend 25% limited    Lumbar - Left Side Bend 25% limited    Lumbar - Right Rotation 25% limited    Lumbar - Left Rotation 25% limited      Strength   Strength Assessment Site Hip;Knee;Ankle    Right/Left Hip Right;Left    Right Hip Flexion 4+/5    Right Hip Extension 3+/5    Left Hip Flexion 4+/5    Left Hip Extension 4-/5    Right/Left Knee Right;Left    Right Knee Flexion 5/5    Right Knee Extension 5/5    Left Knee Flexion 5/5    Left Knee Extension 5/5    Right/Left Ankle Right;Left    Right Ankle Dorsiflexion 5/5    Left Ankle Dorsiflexion 5/5      Palpation   Spinal mobility grossly hypomobile thoracic and lumbar spine, L4-L5 CPA most painful    SI assessment  painful UPA of sacrum    Palpation comment TTP L4, L5 at midline, bilateral glutes, lower lumbar paraspinals      Ambulation/Gait   Ambulation/Gait Yes    Ambulation/Gait Assistance 7: Independent    Ambulation Distance (Feet) 375 Feet    Assistive device None    Gait Pattern Decreased trunk rotation    Ambulation Surface Level;Indoor    Gait velocity decreased    Gait Comments 2MWT; same pain throughout                      Objective measurements completed on examination: See above findings.               PT Education - 07/16/20 1133    Education Details Patient educated on exam findings, POC, scope of PT, lumbar anatomy and pathology    Person(s) Educated Patient    Methods Explanation;Demonstration    Comprehension Verbalized understanding;Returned demonstration            PT Short Term Goals - 07/16/20 1226      PT SHORT TERM GOAL #1   Title Patient will be independent with HEP in order to improve functional outcomes.    Time 3    Period Weeks    Status  New    Target Date  08/06/20      PT SHORT TERM GOAL #2   Title Patient will report at least 25% improvement in symptoms for improved quality of life.    Time 3    Period Weeks    Status New    Target Date 08/06/20             PT Long Term Goals - 07/16/20 1226      PT LONG TERM GOAL #1   Title Patient will report at least 75% improvement in symptoms for improved quality of life.    Time 6    Period Weeks    Status New    Target Date 08/27/20      PT LONG TERM GOAL #2   Title Patient will improve FOTO score by at least 10 points in order to indicate improved tolerance to activity.    Time 6    Period Weeks    Status New    Target Date 08/27/20      PT LONG TERM GOAL #3   Title Patient will demonstrate at least 25% improvement in lumbar ROM in all planes for improved ability to move trunk while completing chores    Time 6    Period Weeks    Status New    Target Date 08/27/20                  Plan - 07/16/20 1223    Clinical Impression Statement Patient is a 74 y.o. female who presents to physical therapy with c/o low back pain. She presents with pain limited deficits in lumbar and bilateral hip strength, ROM, endurance, postural impairments, spinal mobility and functional mobility with ADL. She is having to modify and restrict ADL as indicated by FOTO score as well as subjective information and objective measures which is affecting overall participation. Patient will benefit from skilled physical therapy in order to improve function and reduce impairment.    Personal Factors and Comorbidities Age;Comorbidity 3+;Time since onset of injury/illness/exacerbation;Past/Current Experience;Fitness    Comorbidities Arthritis, Asthma, Back pain, Diabetes Type I or II, High Blood Pressure    Examination-Activity Limitations Bend;Lift;Locomotion Level;Squat;Stairs;Stand;Transfers    Examination-Participation Restrictions Cleaning;Community Activity;Laundry;Yard Work;Volunteer;Shop;Meal Prep     Stability/Clinical Decision Making Stable/Uncomplicated    Clinical Decision Making Low    Rehab Potential Good    PT Frequency 2x / week    PT Duration 6 weeks    PT Treatment/Interventions ADLs/Self Care Home Management;Aquatic Therapy;Cryotherapy;Electrical Stimulation;Iontophoresis 4mg /ml Dexamethasone;Moist Heat;Ultrasound;Traction;DME Instruction;Gait training;Stair training;Functional mobility training;Therapeutic activities;Therapeutic exercise;Balance training;Neuromuscular re-education;Orthotic Fit/Training;Patient/family education;Manual techniques;Dry needling;Splinting;Energy conservation;Spinal Manipulations;Joint Manipulations    PT Next Visit Plan begin core strengthing and hip abd/extensor strengthening, lumbar mobility exercises    PT Home Exercise Plan initiate next session    Consulted and Agree with Plan of Care Patient           Patient will benefit from skilled therapeutic intervention in order to improve the following deficits and impairments:  Abnormal gait, Decreased range of motion, Difficulty walking, Decreased endurance, Decreased activity tolerance, Impaired perceived functional ability, Pain, Decreased balance, Improper body mechanics, Impaired flexibility, Hypomobility, Decreased mobility, Decreased strength, Postural dysfunction  Visit Diagnosis: Low back pain, unspecified back pain laterality, unspecified chronicity, unspecified whether sciatica present  Muscle weakness (generalized)  Other abnormalities of gait and mobility  Other symptoms and signs involving the musculoskeletal system     Problem List Patient Active Problem List   Diagnosis Date Noted  .  De Quervain's disease (radial styloid tenosynovitis) 08/08/2012  . Shoulder pain 11/17/2011  . Shoulder stiffness 11/17/2011  . Muscle weakness (generalized) 11/17/2011  . S/P shoulder surgery 11/01/2011  . Biceps tendon rupture, proximal 10/25/2011  . Rotator cuff tear, left 10/25/2011  .  Rotator cuff tear 10/05/2011  . Bursitis of shoulder, left 09/28/2011  . Back pain 01/26/2011  . IMPINGEMENT SYNDROME 09/29/2009  . RUPTURE ROTATOR CUFF 09/29/2009  . FIBROIDS, UTERUS 06/27/2009  . DM 06/27/2009  . HYPERLIPIDEMIA 06/27/2009  . ANEMIA, IRON DEFICIENCY, MICROCYTIC 06/27/2009  . GERD 06/27/2009   12:29 PM, 07/16/20 Ann Vasquez PT, DPT Physical Therapist at [facility] [place], [place], [address] Phone: [phone]   Fax:  [phone]  Name: MAEVEN MCDOUGALL MRN: [id number] Date of Birth: 04/24/1946

## 2020-07-28 ENCOUNTER — Encounter (INDEPENDENT_AMBULATORY_CARE_PROVIDER_SITE_OTHER): Payer: Self-pay | Admitting: *Deleted

## 2020-07-29 ENCOUNTER — Encounter (HOSPITAL_COMMUNITY): Payer: Self-pay

## 2020-07-29 ENCOUNTER — Ambulatory Visit (HOSPITAL_COMMUNITY): Payer: Medicare PPO

## 2020-07-29 ENCOUNTER — Other Ambulatory Visit: Payer: Self-pay

## 2020-07-29 DIAGNOSIS — M545 Low back pain, unspecified: Secondary | ICD-10-CM | POA: Diagnosis not present

## 2020-07-29 DIAGNOSIS — R2689 Other abnormalities of gait and mobility: Secondary | ICD-10-CM

## 2020-07-29 DIAGNOSIS — R29898 Other symptoms and signs involving the musculoskeletal system: Secondary | ICD-10-CM

## 2020-07-29 DIAGNOSIS — M6281 Muscle weakness (generalized): Secondary | ICD-10-CM

## 2020-07-29 NOTE — Patient Instructions (Addendum)
Isometric Abdominal    Lying on back with knees bent, tighten stomach by pressing elbows down. Hold 5 seconds. Repeat 10 times per set. Do 2 sets per session. Do 1 sessions per day, 4 session per week.  http://orth.exer.us/1087   Copyright  VHI. All rights reserved.   Lower Trunk Rotation Stretch    Keeping back flat and feet together, rotate knees to left side. Hold 10 seconds. Repeat 5 times per set.  Do 1 sessions per day.  http://orth.exer.us/123   Copyright  VHI. All rights reserved.   Bridge    Lie back, legs bent. Inhale, pressing hips up. Keeping ribs in, lengthen lower back. Exhale, rolling down along spine from top. Repeat 10 times. Do 1-2 sessions per day.  http://pm.exer.us/55   Copyright  VHI. All rights reserved.   Abduction: Clam (Eccentric) - Side-Lying    Lie on side with knees bent. Lift top knee, keeping feet together. Keep trunk steady.  Slowly lower for 3-5 seconds. 10 reps per set, 1 set per day, 4 days per week.  http://ecce.exer.us/65   Copyright  VHI. All rights reserved.

## 2020-07-29 NOTE — Therapy (Signed)
[place], [place], [address] Phone: [phone]   Fax:  [phone]  Physical Therapy Treatment  Patient Details  Name: Ann Vasquez MRN: [id number] Date of Birth: [birth year] Referring Provider (PT): Arther Abbott MD   Encounter Date: 07/29/2020   PT End of Session - 07/29/20 1132    Visit Number 2    Number of Visits 12    Date for PT Re-Evaluation 08/27/20    Authorization Type [employer]    Authorization Time Period [place] approved 12 pt visits from 07/16/20 thru 08/27/20 [id number]    Progress Note Due on Visit 10    PT Start Time 1130    PT Stop Time 1210    PT Time Calculation (min) 40 min    Activity Tolerance Patient tolerated treatment well    Behavior During Therapy [facility] for tasks assessed/performed           Past Medical History:  Diagnosis Date  . Anemia   . Cellulitis and abscess of leg    extends to foot  . Diabetes mellitus type 2, diet-controlled ([place])   . Frequent headaches   . Heart murmur   . High cholesterol   . HTN (hypertension)   . Hyperlipidemia     Past Surgical History:  Procedure Laterality Date  . COLONOSCOPY WITH PROPOFOL N/A 05/27/2020   Procedure: COLONOSCOPY WITH PROPOFOL;  Surgeon: [name], MD;  [facility];  Service: Gastroenterology;  Laterality: N/A;  815  . EYE SURGERY Right    detached retina  . POLYPECTOMY  05/27/2020   Procedure: POLYPECTOMY;  Surgeon: [name], MD;  [facility];  Service: Gastroenterology;;  . right shoulder  2011   rotator cuff repair, Aline Brochure, [facility]  . SHOULDER OPEN ROTATOR CUFF REPAIR  10/29/2011   Procedure: left ROTATOR CUFF REPAIR SHOULDER OPEN;  Surgeon: Arther Abbott, MD;  Location: [facility];  Service: Orthopedics;  Laterality: Left;    There were no vitals filed for this visit.   Subjective Assessment - 07/29/20 1128    Subjective Pt reports she is  feeling good today, no reports of pain currently.  Reports a cramp in Lt anterior hip for a couple days.  No reports of radicular symptoms down LE today.    Patient Stated Goals decrease pain    Currently in Pain? No/denies                             [facility] Adult PT Treatment/Exercise - 07/29/20 0001      Exercises   Exercises Lumbar      Lumbar Exercises: Standing   Functional Squats 5 reps    Functional Squats Limitations cueing for mechanics with HHA on counter      Lumbar Exercises: Supine   Ab Set 10 reps;3 seconds    AB Set Limitations paired contraction with exhalation    Bent Knee Raise 10 reps;3 seconds    Bent Knee Raise Limitations with ab set    Bridge 10 reps      Lumbar Exercises: Sidelying   Clam 10 reps    Hip Abduction 10 reps    Hip Abduction Limitations with ab set to keep good form                  PT Education - 07/29/20 1142    Education Details Reviewed eval findings and goals, educated  importance of HEP complaince that was established this session with verbal cueing, printout and correct demonstration/    Person(s) Educated Patient    Methods Explanation;Demonstration;Verbal cues;Handout    Comprehension Verbalized understanding;Returned demonstration            PT Short Term Goals - 07/16/20 1226      PT SHORT TERM GOAL #1   Title Patient will be independent with HEP in order to improve functional outcomes.    Time 3    Period Weeks    Status New    Target Date 08/06/20      PT SHORT TERM GOAL #2   Title Patient will report at least 25% improvement in symptoms for improved quality of life.    Time 3    Period Weeks    Status New    Target Date 08/06/20             PT Long Term Goals - 07/16/20 1226      PT LONG TERM GOAL #1   Title Patient will report at least 75% improvement in symptoms for improved quality of life.    Time 6    Period Weeks    Status New    Target Date 08/27/20      PT LONG TERM  GOAL #2   Title Patient will improve FOTO score by at least 10 points in order to indicate improved tolerance to activity.    Time 6    Period Weeks    Status New    Target Date 08/27/20      PT LONG TERM GOAL #3   Title Patient will demonstrate at least 25% improvement in lumbar ROM in all planes for improved ability to move trunk while completing chores    Time 6    Period Weeks    Status New    Target Date 08/27/20                 Plan - 07/29/20 1148    Clinical Impression Statement Reviewed eval findings and goals.  Educated importance of HEP compliance for maximal benefits that was established this session.  Session focus on core and proximal strengthening as well as lumbar mobility.  Paired isometric abdominal sets with exhalation to reduce holding breath.  Pt able to complete all exercises with no report of increased pain and cramping resolved.  Noted gluteal weakness with inability to complete full range with bridges.    Personal Factors and Comorbidities Age;Comorbidity 3+;Time since onset of injury/illness/exacerbation;Past/Current Experience;Fitness    Comorbidities Arthritis, Asthma, Back pain, Diabetes Type I or II, High Blood Pressure    Examination-Activity Limitations Bend;Lift;Locomotion Level;Squat;Stairs;Stand;Transfers    Examination-Participation Restrictions Cleaning;Community Activity;Laundry;Yard Work;Volunteer;Shop;Meal Prep    Stability/Clinical Decision Making Stable/Uncomplicated    Clinical Decision Making Low    Rehab Potential Good    PT Frequency 2x / week    PT Duration 6 weeks    PT Treatment/Interventions ADLs/Self Care Home Management;Aquatic Therapy;Cryotherapy;Electrical Stimulation;Iontophoresis 4mg /ml Dexamethasone;Moist Heat;Ultrasound;Traction;DME Instruction;Gait training;Stair training;Functional mobility training;Therapeutic activities;Therapeutic exercise;Balance training;Neuromuscular re-education;Orthotic Fit/Training;Patient/family  education;Manual techniques;Dry needling;Splinting;Energy conservation;Spinal Manipulations;Joint Manipulations    PT Next Visit Plan Progress core and proximal strengthening, lumbar mobility exercises.    PT Home Exercise Plan 11/30: isometric ab set, LTR, bridge           Patient will benefit from skilled therapeutic intervention in order to improve the following deficits and impairments:  Abnormal gait, Decreased range of motion, Difficulty walking, Decreased endurance, Decreased activity tolerance,  Impaired perceived functional ability, Pain, Decreased balance, Improper body mechanics, Impaired flexibility, Hypomobility, Decreased mobility, Decreased strength, Postural dysfunction  Visit Diagnosis: Low back pain, unspecified back pain laterality, unspecified chronicity, unspecified whether sciatica present  Muscle weakness (generalized)  Other abnormalities of gait and mobility  Other symptoms and signs involving the musculoskeletal system     Problem List Patient Active Problem List   Diagnosis Date Noted  . De Quervain's disease (radial styloid tenosynovitis) 08/08/2012  . Shoulder pain 11/17/2011  . Shoulder stiffness 11/17/2011  . Muscle weakness (generalized) 11/17/2011  . S/P shoulder surgery 11/01/2011  . Biceps tendon rupture, proximal 10/25/2011  . Rotator cuff tear, left 10/25/2011  . Rotator cuff tear 10/05/2011  . Bursitis of shoulder, left 09/28/2011  . Back pain 01/26/2011  . IMPINGEMENT SYNDROME 09/29/2009  . RUPTURE ROTATOR CUFF 09/29/2009  . FIBROIDS, UTERUS 06/27/2009  . DM 06/27/2009  . HYPERLIPIDEMIA 06/27/2009  . ANEMIA, IRON DEFICIENCY, MICROCYTIC 06/27/2009  . GERD 06/27/2009   Ihor Austin, LPTA/CLT; CBIS [phone]  Ann Vasquez 07/29/2020, 12:15 PM  [place], [place], [address] Phone: [phone]   Fax:  [phone]  Name: Ann Vasquez MRN:  [id number] Date of Birth: 09/29/1945

## 2020-07-31 ENCOUNTER — Other Ambulatory Visit: Payer: Self-pay

## 2020-07-31 ENCOUNTER — Ambulatory Visit (HOSPITAL_COMMUNITY): Payer: Medicare PPO | Attending: Orthopedic Surgery | Admitting: Physical Therapy

## 2020-07-31 DIAGNOSIS — R29898 Other symptoms and signs involving the musculoskeletal system: Secondary | ICD-10-CM | POA: Insufficient documentation

## 2020-07-31 DIAGNOSIS — M545 Low back pain, unspecified: Secondary | ICD-10-CM | POA: Diagnosis not present

## 2020-07-31 DIAGNOSIS — M6281 Muscle weakness (generalized): Secondary | ICD-10-CM | POA: Insufficient documentation

## 2020-07-31 DIAGNOSIS — G8929 Other chronic pain: Secondary | ICD-10-CM | POA: Insufficient documentation

## 2020-07-31 DIAGNOSIS — R2689 Other abnormalities of gait and mobility: Secondary | ICD-10-CM | POA: Diagnosis not present

## 2020-07-31 NOTE — Therapy (Signed)
[place], [place], [address] Phone: [phone]   Fax:  [phone]  Physical Therapy Treatment  Patient Details  Name: Ann Vasquez MRN: [id number] Date of Birth: 08-02-1946 Referring Provider (PT): Arther Abbott MD   Encounter Date: 07/31/2020   PT End of Session - 07/31/20 1326    Visit Number 3    Number of Visits 12    Date for PT Re-Evaluation 08/27/20    Authorization Type [employer]    Authorization Time Period Cohere approved 12 pt visits from 07/16/20 thru 08/27/20 [id number]    Progress Note Due on Visit 10    PT Start Time 1136    PT Stop Time 1214    PT Time Calculation (min) 38 min    Activity Tolerance Patient tolerated treatment well    Behavior During Therapy [facility] for tasks assessed/performed           Past Medical History:  Diagnosis Date  . Anemia   . Cellulitis and abscess of leg    extends to foot  . Diabetes mellitus type 2, diet-controlled ([place])   . Frequent headaches   . Heart murmur   . High cholesterol   . HTN (hypertension)   . Hyperlipidemia     Past Surgical History:  Procedure Laterality Date  . COLONOSCOPY WITH PROPOFOL N/A 05/27/2020   Procedure: COLONOSCOPY WITH PROPOFOL;  Surgeon: [name], MD;  [facility];  Service: Gastroenterology;  Laterality: N/A;  815  . EYE SURGERY Right    detached retina  . POLYPECTOMY  05/27/2020   Procedure: POLYPECTOMY;  Surgeon: [name], MD;  [facility];  Service: Gastroenterology;;  . right shoulder  2011   rotator cuff repair, Aline Brochure, [facility]  . SHOULDER OPEN ROTATOR CUFF REPAIR  10/29/2011   Procedure: left ROTATOR CUFF REPAIR SHOULDER OPEN;  Surgeon: Arther Abbott, MD;  Location: [facility];  Service: Orthopedics;  Laterality: Left;    There were no vitals filed for this visit.                      [facility] Adult PT Treatment/Exercise -  07/31/20 0001      Lumbar Exercises: Standing   Heel Raises 20 reps    Other Standing Lumbar Exercises hip abduction, hip extension 10 reps each    Other Standing Lumbar Exercises hip excursions 10 reps each      Lumbar Exercises: Supine   Ab Set 10 reps;3 seconds    Bridge 10 reps    Straight Leg Raise 10 reps      Lumbar Exercises: Sidelying   Hip Abduction 10 reps      Lumbar Exercises: Prone   Straight Leg Raise 10 reps    Other Prone Lumbar Exercises heelsqueeze 10 reps 5" holds    Other Prone Lumbar Exercises Press ups 10X                    PT Short Term Goals - 07/16/20 1226      PT SHORT TERM GOAL #1   Title Patient will be independent with HEP in order to improve functional outcomes.    Time 3    Period Weeks    Status New    Target Date 08/06/20      PT SHORT TERM GOAL #2   Title Patient will report at least 25% improvement in symptoms for improved quality of life.  Time 3    Period Weeks    Status New    Target Date 08/06/20             PT Long Term Goals - 07/16/20 1226      PT LONG TERM GOAL #1   Title Patient will report at least 75% improvement in symptoms for improved quality of life.    Time 6    Period Weeks    Status New    Target Date 08/27/20      PT LONG TERM GOAL #2   Title Patient will improve FOTO score by at least 10 points in order to indicate improved tolerance to activity.    Time 6    Period Weeks    Status New    Target Date 08/27/20      PT LONG TERM GOAL #3   Title Patient will demonstrate at least 25% improvement in lumbar ROM in all planes for improved ability to move trunk while completing chores    Time 6    Period Weeks    Status New    Target Date 08/27/20                 Plan - 07/31/20 1325    Clinical Impression Statement Began standing hip strengthening exercises and hip exursions to improve lumbar ROM.  Continued with established therex with minimal cues needed for form.  Pt reported  overall improvement since beginning therapy and the exercises always make her feel better.    Personal Factors and Comorbidities Age;Comorbidity 3+;Time since onset of injury/illness/exacerbation;Past/Current Experience;Fitness    Comorbidities Arthritis, Asthma, Back pain, Diabetes Type I or II, High Blood Pressure    Examination-Activity Limitations Bend;Lift;Locomotion Level;Squat;Stairs;Stand;Transfers    Examination-Participation Restrictions Cleaning;Community Activity;Laundry;Yard Work;Volunteer;Shop;Meal Prep    Stability/Clinical Decision Making Stable/Uncomplicated    Rehab Potential Good    PT Frequency 2x / week    PT Duration 6 weeks    PT Treatment/Interventions ADLs/Self Care Home Management;Aquatic Therapy;Cryotherapy;Electrical Stimulation;Iontophoresis 4mg /ml Dexamethasone;Moist Heat;Ultrasound;Traction;DME Instruction;Gait training;Stair training;Functional mobility training;Therapeutic activities;Therapeutic exercise;Balance training;Neuromuscular re-education;Orthotic Fit/Training;Patient/family education;Manual techniques;Dry needling;Splinting;Energy conservation;Spinal Manipulations;Joint Manipulations    PT Next Visit Plan Progress core and proximal strengthening, lumbar mobility exercises.    PT Home Exercise Plan 11/30: isometric ab set, LTR, bridge           Patient will benefit from skilled therapeutic intervention in order to improve the following deficits and impairments:  Abnormal gait, Decreased range of motion, Difficulty walking, Decreased endurance, Decreased activity tolerance, Impaired perceived functional ability, Pain, Decreased balance, Improper body mechanics, Impaired flexibility, Hypomobility, Decreased mobility, Decreased strength, Postural dysfunction  Visit Diagnosis: Low back pain, unspecified back pain laterality, unspecified chronicity, unspecified whether sciatica present  Muscle weakness (generalized)  Other abnormalities of gait and  mobility  Other symptoms and signs involving the musculoskeletal system     Problem List Patient Active Problem List   Diagnosis Date Noted  . De Quervain's disease (radial styloid tenosynovitis) 08/08/2012  . Shoulder pain 11/17/2011  . Shoulder stiffness 11/17/2011  . Muscle weakness (generalized) 11/17/2011  . S/P shoulder surgery 11/01/2011  . Biceps tendon rupture, proximal 10/25/2011  . Rotator cuff tear, left 10/25/2011  . Rotator cuff tear 10/05/2011  . Bursitis of shoulder, left 09/28/2011  . Back pain 01/26/2011  . IMPINGEMENT SYNDROME 09/29/2009  . RUPTURE ROTATOR CUFF 09/29/2009  . FIBROIDS, UTERUS 06/27/2009  . DM 06/27/2009  . HYPERLIPIDEMIA 06/27/2009  . ANEMIA, IRON DEFICIENCY, MICROCYTIC 06/27/2009  . GERD 06/27/2009  Teena Irani, PTA/CLT [phone]  Teena Irani 07/31/2020, 1:27 PM  [place] [address] [place], [place], [address] Phone: [phone]   Fax:  [phone]  Name: Ann Vasquez MRN: [id number] Date of Birth: Aug 24, 1946

## 2020-08-05 ENCOUNTER — Ambulatory Visit (HOSPITAL_COMMUNITY): Payer: Medicare PPO

## 2020-08-05 ENCOUNTER — Encounter (HOSPITAL_COMMUNITY): Payer: Self-pay

## 2020-08-05 ENCOUNTER — Other Ambulatory Visit: Payer: Self-pay

## 2020-08-05 DIAGNOSIS — M6281 Muscle weakness (generalized): Secondary | ICD-10-CM

## 2020-08-05 DIAGNOSIS — G8929 Other chronic pain: Secondary | ICD-10-CM | POA: Diagnosis not present

## 2020-08-05 DIAGNOSIS — R29898 Other symptoms and signs involving the musculoskeletal system: Secondary | ICD-10-CM | POA: Diagnosis not present

## 2020-08-05 DIAGNOSIS — R2689 Other abnormalities of gait and mobility: Secondary | ICD-10-CM | POA: Diagnosis not present

## 2020-08-05 DIAGNOSIS — M545 Low back pain, unspecified: Secondary | ICD-10-CM | POA: Diagnosis not present

## 2020-08-05 NOTE — Patient Instructions (Signed)
Back Hyperextension: Using Arms    Lying face down with arms bent, inhale. Then while exhaling, straighten arms. Hold 10 seconds. Slowly return to starting position. Repeat 5 times per set. Do 1 sets per day.  Copyright  VHI. All rights reserved.

## 2020-08-05 NOTE — Therapy (Signed)
[name], [place], [address] Phone: [phone]   Fax:  [phone]  Physical Therapy Treatment  Patient Details  Name: Ann Vasquez MRN: [id number] Date of Birth: 08-24-1946 Referring Provider (PT): Arther Abbott MD   Encounter Date: 08/05/2020   PT End of Session - 08/05/20 1128    Visit Number 4    Number of Visits 12    Date for PT Re-Evaluation 08/27/20    Authorization Type [employer] Medicare    Authorization Time Period Cohere approved 12 pt visits from 07/16/20 thru 08/27/20 [id number]    Progress Note Due on Visit 10    PT Start Time 1124    PT Stop Time 1203    PT Time Calculation (min) 39 min    Activity Tolerance Patient tolerated treatment well    Behavior During Therapy [facility] for tasks assessed/performed           Past Medical History:  Diagnosis Date  . Anemia   . Cellulitis and abscess of leg    extends to foot  . Diabetes mellitus type 2, diet-controlled ([place])   . Frequent headaches   . Heart murmur   . High cholesterol   . HTN (hypertension)   . Hyperlipidemia     Past Surgical History:  Procedure Laterality Date  . COLONOSCOPY WITH PROPOFOL N/A 05/27/2020   Procedure: COLONOSCOPY WITH PROPOFOL;  Surgeon: [name], MD;  [facility];  Service: Gastroenterology;  Laterality: N/A;  815  . EYE SURGERY Right    detached retina  . POLYPECTOMY  05/27/2020   Procedure: POLYPECTOMY;  Surgeon: [name], MD;  [facility];  Service: Gastroenterology;;  . right shoulder  2011   rotator cuff repair, Aline Brochure, [facility]  . SHOULDER OPEN ROTATOR CUFF REPAIR  10/29/2011   Procedure: left ROTATOR CUFF REPAIR SHOULDER OPEN;  Surgeon: Arther Abbott, MD;  Location: [facility];  Service: Orthopedics;  Laterality: Left;    There were no vitals filed for this visit.   Subjective Assessment - 08/05/20 1126    Subjective Pt stated she is  feeling good today, no reports of pain.  Reports compliance with HEP daily.    Patient Stated Goals decrease pain    Currently in Pain? No/denies                             [facility] Adult PT Treatment/Exercise - 08/05/20 0001      Exercises   Exercises Lumbar      Lumbar Exercises: Stretches   Prone on Elbows Stretch --   2 minutes   Press Ups 5 reps;10 seconds      Lumbar Exercises: Standing   Heel Raises 20 reps    Heel Raises Limitations minimal HHA    Functional Squats 10 reps    Functional Squats Limitations 3D hip excursion, squats complete front of chair for mechanics    Forward Lunge 10 reps    Forward Lunge Limitations 6in step, no HHA    Other Standing Lumbar Exercises hip abduction, hip extension 15 reps each    Other Standing Lumbar Exercises SLS x1 Lt 55", Rt 33"; Vector stance 3x 5"      Lumbar Exercises: Quadruped   Single Arm Raise Right;Left;5 reps;3 seconds    Straight Leg Raise 5 reps;3 seconds  PT Short Term Goals - 07/16/20 1226      PT SHORT TERM GOAL #1   Title Patient will be independent with HEP in order to improve functional outcomes.    Time 3    Period Weeks    Status New    Target Date 08/06/20      PT SHORT TERM GOAL #2   Title Patient will report at least 25% improvement in symptoms for improved quality of life.    Time 3    Period Weeks    Status New    Target Date 08/06/20             PT Long Term Goals - 07/16/20 1226      PT LONG TERM GOAL #1   Title Patient will report at least 75% improvement in symptoms for improved quality of life.    Time 6    Period Weeks    Status New    Target Date 08/27/20      PT LONG TERM GOAL #2   Title Patient will improve FOTO score by at least 10 points in order to indicate improved tolerance to activity.    Time 6    Period Weeks    Status New    Target Date 08/27/20      PT LONG TERM GOAL #3   Title Patient will demonstrate at least 25%  improvement in lumbar ROM in all planes for improved ability to move trunk while completing chores    Time 6    Period Weeks    Status New    Target Date 08/27/20                 Plan - 08/05/20 1135    Clinical Impression Statement Session focus on stability exercises for core, hip and functional strengthening with additional standing exercises.  Pt able to complete all exercises with good form following initial cueing.  Majority of standing exercises complete with HHA to improve stablity with tasks.    Personal Factors and Comorbidities Age;Comorbidity 3+;Time since onset of injury/illness/exacerbation;Past/Current Experience;Fitness    Comorbidities Arthritis, Asthma, Back pain, Diabetes Type I or II, High Blood Pressure    Examination-Activity Limitations Bend;Lift;Locomotion Level;Squat;Stairs;Stand;Transfers    Examination-Participation Restrictions Cleaning;Community Activity;Laundry;Yard Work;Volunteer;Shop;Meal Prep    Stability/Clinical Decision Making Stable/Uncomplicated    Clinical Decision Making Low    Rehab Potential Good    PT Frequency 2x / week    PT Duration 6 weeks    PT Treatment/Interventions ADLs/Self Care Home Management;Aquatic Therapy;Cryotherapy;Electrical Stimulation;Iontophoresis 4mg /ml Dexamethasone;Moist Heat;Ultrasound;Traction;DME Instruction;Gait training;Stair training;Functional mobility training;Therapeutic activities;Therapeutic exercise;Balance training;Neuromuscular re-education;Orthotic Fit/Training;Patient/family education;Manual techniques;Dry needling;Splinting;Energy conservation;Spinal Manipulations;Joint Manipulations    PT Next Visit Plan Progress core and proximal strengthening, lumbar mobility exercises.    PT Home Exercise Plan 11/30: isometric ab set, LTR, bridge           Patient will benefit from skilled therapeutic intervention in order to improve the following deficits and impairments:  Abnormal gait, Decreased range of  motion, Difficulty walking, Decreased endurance, Decreased activity tolerance, Impaired perceived functional ability, Pain, Decreased balance, Improper body mechanics, Impaired flexibility, Hypomobility, Decreased mobility, Decreased strength, Postural dysfunction  Visit Diagnosis: Low back pain, unspecified back pain laterality, unspecified chronicity, unspecified whether sciatica present  Muscle weakness (generalized)  Other abnormalities of gait and mobility  Other symptoms and signs involving the musculoskeletal system     Problem List Patient Active Problem List   Diagnosis Date Noted  . De Quervain's disease (radial  styloid tenosynovitis) 08/08/2012  . Shoulder pain 11/17/2011  . Shoulder stiffness 11/17/2011  . Muscle weakness (generalized) 11/17/2011  . S/P shoulder surgery 11/01/2011  . Biceps tendon rupture, proximal 10/25/2011  . Rotator cuff tear, left 10/25/2011  . Rotator cuff tear 10/05/2011  . Bursitis of shoulder, left 09/28/2011  . Back pain 01/26/2011  . IMPINGEMENT SYNDROME 09/29/2009  . RUPTURE ROTATOR CUFF 09/29/2009  . FIBROIDS, UTERUS 06/27/2009  . DM 06/27/2009  . HYPERLIPIDEMIA 06/27/2009  . ANEMIA, IRON DEFICIENCY, MICROCYTIC 06/27/2009  . GERD 06/27/2009   Ihor Austin, LPTA/CLT; CBIS [phone]   Aldona Lento 08/05/2020, 12:04 PM  [place] [address] [place], [place], [address] Phone: [phone]   Fax:  [phone]  Name: KERINGTON HILDEBRANT MRN: [id number] Date of Birth: 06-24-1946

## 2020-08-07 ENCOUNTER — Encounter (HOSPITAL_COMMUNITY): Payer: Self-pay | Admitting: Physical Therapy

## 2020-08-07 ENCOUNTER — Other Ambulatory Visit: Payer: Self-pay

## 2020-08-07 ENCOUNTER — Ambulatory Visit (HOSPITAL_COMMUNITY): Payer: Medicare PPO | Admitting: Physical Therapy

## 2020-08-07 DIAGNOSIS — Z23 Encounter for immunization: Secondary | ICD-10-CM | POA: Diagnosis not present

## 2020-08-07 DIAGNOSIS — M545 Low back pain, unspecified: Secondary | ICD-10-CM

## 2020-08-07 DIAGNOSIS — R2689 Other abnormalities of gait and mobility: Secondary | ICD-10-CM

## 2020-08-07 DIAGNOSIS — R29898 Other symptoms and signs involving the musculoskeletal system: Secondary | ICD-10-CM

## 2020-08-07 DIAGNOSIS — G8929 Other chronic pain: Secondary | ICD-10-CM | POA: Diagnosis not present

## 2020-08-07 DIAGNOSIS — M6281 Muscle weakness (generalized): Secondary | ICD-10-CM | POA: Diagnosis not present

## 2020-08-07 NOTE — Therapy (Signed)
[place], [place], [address] Phone: [phone]   Fax:  [phone]  Physical Therapy Treatment  Patient Details  Name: Ann Vasquez MRN: [id number] Date of Birth: 1946/01/14 Referring Provider (PT): Arther Abbott MD   Encounter Date: 08/07/2020   PT End of Session - 08/07/20 1326    Visit Number 5    Number of Visits 12    Date for PT Re-Evaluation 08/27/20    Authorization Type [employer] Medicare    Authorization Time Period Cohere approved 12 pt visits from 07/16/20 thru 08/27/20 [id number]    Progress Note Due on Visit 10    PT Start Time 1316    PT Stop Time 1354    PT Time Calculation (min) 38 min    Activity Tolerance Patient tolerated treatment well    Behavior During Therapy [facility] for tasks assessed/performed           Past Medical History:  Diagnosis Date  . Anemia   . Cellulitis and abscess of leg    extends to foot  . Diabetes mellitus type 2, diet-controlled ([place])   . Frequent headaches   . Heart murmur   . High cholesterol   . HTN (hypertension)   . Hyperlipidemia     Past Surgical History:  Procedure Laterality Date  . COLONOSCOPY WITH PROPOFOL N/A 05/27/2020   Procedure: COLONOSCOPY WITH PROPOFOL;  Surgeon: [name], MD;  [facility];  Service: Gastroenterology;  Laterality: N/A;  815  . EYE SURGERY Right    detached retina  . POLYPECTOMY  05/27/2020   Procedure: POLYPECTOMY;  Surgeon: [name], MD;  [facility];  Service: Gastroenterology;;  . right shoulder  2011   rotator cuff repair, Aline Brochure, [facility]  . SHOULDER OPEN ROTATOR CUFF REPAIR  10/29/2011   Procedure: left ROTATOR CUFF REPAIR SHOULDER OPEN;  Surgeon: Arther Abbott, MD;  Location[facility];  Service: Orthopedics;  Laterality: Left;    There were no vitals filed for this visit.   Subjective Assessment - 08/07/20 1317    Subjective Pt stated she is  feeling good today, no reports of pain. Patient reports greatest discomfort is experienced when performing tasks such as sweeping or picking up items from ground and reports that the repeated bending and twisting aggravate symptoms    Limitations House hold activities;Standing;Sitting;Walking    How long can you walk comfortably? 100 feet    Patient Stated Goals decrease pain    Currently in Pain? No/denies    Pain Score 0-No pain    Pain Onset More than a month ago              [facility] PT Assessment - 08/07/20 0001      Assessment   Medical Diagnosis DDD, LBP    Referring Provider (PT) Arther Abbott MD    Onset Date/Surgical Date 05/18/20      Observation/Other Assessments   Focus on Therapeutic Outcomes (FOTO)  60% function                         [facility] Adult PT Treatment/Exercise - 08/07/20 0001      Exercises   Exercises Knee/Hip      Lumbar Exercises: Stretches   Other Lumbar Stretch Exercise seated lateral flexion ROM/stretch to decrease pain with sidebending    Other Lumbar Stretch Exercise seated forward flexion 5x5sec hold, trunk rotations in sitting 5x  Lumbar Exercises: Standing   Other Standing Lumbar Exercises lateral steps with band resistance   paloff position red t-band for anti-rotation     Knee/Hip Exercises: Standing   Lateral Step Up Step Height: 4";10 reps;3 sets   no UE support   Other Standing Knee Exercises lateral stepping x 10 ft left/right   x 2 min intervals                 PT Education - 08/07/20 1323    Education Details Review of FOTO survey and demonstrates one-point improvement from 59 to 60    Person(s) Educated Patient    Methods Explanation;Demonstration    Comprehension Verbalized understanding            PT Short Term Goals - 08/07/20 1337      PT SHORT TERM GOAL #1   Status On-going      PT SHORT TERM GOAL #2   Status On-going             PT Long Term Goals - 08/07/20 1338      PT  LONG TERM GOAL #1   Status On-going      PT LONG TERM GOAL #2   Status On-going      PT LONG TERM GOAL #3   Status On-going                 Plan - 08/07/20 1327    Clinical Impression Statement Treatment session with focus on functional strengthening activities performed in dynamic nature to improve carryover of bracing and postural correction to decrease pain with ADL and housekeeping tasks. Patient demonstrates improved performance with decreased complaints of pain with exercise and activity. Continues to exhibit trunk/core weakness with greatest discomfort noted with rotation-biased movements    Personal Factors and Comorbidities Age;Comorbidity 3+;Time since onset of injury/illness/exacerbation;Past/Current Experience;Fitness    Comorbidities Arthritis, Asthma, Back pain, Diabetes Type I or II, High Blood Pressure    Examination-Activity Limitations Bend;Lift;Locomotion Level;Squat;Stairs;Stand;Transfers    Examination-Participation Restrictions Cleaning;Community Activity;Laundry;Yard Work;Volunteer;Shop;Meal Prep    Stability/Clinical Decision Making Stable/Uncomplicated    Rehab Potential Good    PT Frequency 2x / week    PT Duration 6 weeks    PT Treatment/Interventions ADLs/Self Care Home Management;Aquatic Therapy;Cryotherapy;Electrical Stimulation;Iontophoresis 4mg /ml Dexamethasone;Moist Heat;Ultrasound;Traction;DME Instruction;Gait training;Stair training;Functional mobility training;Therapeutic activities;Therapeutic exercise;Balance training;Neuromuscular re-education;Orthotic Fit/Training;Patient/family education;Manual techniques;Dry needling;Splinting;Energy conservation;Spinal Manipulations;Joint Manipulations    PT Next Visit Plan Progress core and proximal strengthening, lumbar mobility exercises.    PT Home Exercise Plan 11/30: isometric ab set, LTR, bridge; 12/9 later step ups, side stepping, backward walking, lumbar stretches: side bending, flexion    Consulted  and Agree with Plan of Care Patient           Patient will benefit from skilled therapeutic intervention in order to improve the following deficits and impairments:  Abnormal gait,Decreased range of motion,Difficulty walking,Decreased endurance,Decreased activity tolerance,Impaired perceived functional ability,Pain,Decreased balance,Improper body mechanics,Impaired flexibility,Hypomobility,Decreased mobility,Decreased strength,Postural dysfunction  Visit Diagnosis: Low back pain, unspecified back pain laterality, unspecified chronicity, unspecified whether sciatica present  Muscle weakness (generalized)  Other abnormalities of gait and mobility  Other symptoms and signs involving the musculoskeletal system     Problem List Patient Active Problem List   Diagnosis Date Noted  . De Quervain's disease (radial styloid tenosynovitis) 08/08/2012  . Shoulder pain 11/17/2011  . Shoulder stiffness 11/17/2011  . Muscle weakness (generalized) 11/17/2011  . S/P shoulder surgery 11/01/2011  . Biceps tendon rupture, proximal 10/25/2011  . Rotator cuff  tear, left 10/25/2011  . Rotator cuff tear 10/05/2011  . Bursitis of shoulder, left 09/28/2011  . Back pain 01/26/2011  . IMPINGEMENT SYNDROME 09/29/2009  . RUPTURE ROTATOR CUFF 09/29/2009  . FIBROIDS, UTERUS 06/27/2009  . DM 06/27/2009  . HYPERLIPIDEMIA 06/27/2009  . ANEMIA, IRON DEFICIENCY, MICROCYTIC 06/27/2009  . GERD 06/27/2009   Documentation by:  2:00 PM, 08/07/20 M. Sherlyn Lees, PT, DPT Physical Therapist- [place] Office Number: [phone]  treatment by: 2:01 PM, 08/07/20 Jerene Pitch, PT, DPT Physical Therapist- [place] Office Number: [phone]  [place] [address] [place], [place], [address] Phone: [phone]   Fax:  [phone]  Name: Ann Vasquez MRN: [id number] Date of Birth: December 01, 1945

## 2020-08-07 NOTE — Patient Instructions (Signed)
Access Code: [email or web address] URL: [email or web address] Date: 08/07/2020 Prepared by: Sherlyn Lees  Exercises Lateral Step Up - 1 x daily - 7 x weekly - 3 sets - 10 reps Seated Sidebending - 1 x daily - 7 x weekly - 3 sets - 5 reps - 3 hold Seated Flexion Stretch - 1 x daily - 7 x weekly - 3 sets - 5 reps - 5 hold Sidestepping - 1 x daily - 7 x weekly Backwards Walking - 1 x daily - 7 x weekly

## 2020-08-12 ENCOUNTER — Other Ambulatory Visit: Payer: Self-pay

## 2020-08-12 ENCOUNTER — Ambulatory Visit (HOSPITAL_COMMUNITY): Payer: Medicare PPO | Admitting: Physical Therapy

## 2020-08-12 DIAGNOSIS — M6281 Muscle weakness (generalized): Secondary | ICD-10-CM | POA: Diagnosis not present

## 2020-08-12 DIAGNOSIS — G8929 Other chronic pain: Secondary | ICD-10-CM | POA: Diagnosis not present

## 2020-08-12 DIAGNOSIS — R2689 Other abnormalities of gait and mobility: Secondary | ICD-10-CM | POA: Diagnosis not present

## 2020-08-12 DIAGNOSIS — M545 Low back pain, unspecified: Secondary | ICD-10-CM

## 2020-08-12 DIAGNOSIS — R29898 Other symptoms and signs involving the musculoskeletal system: Secondary | ICD-10-CM | POA: Diagnosis not present

## 2020-08-12 NOTE — Therapy (Signed)
[place], [place], [address] Phone: [phone]   Fax:  [phone]  Physical Therapy Treatment  Patient Details  Name: Ann Vasquez MRN: [id number] Date of Birth: 13-Jan-1946 Referring Provider (PT): Arther Abbott MD   Encounter Date: 08/12/2020   PT End of Session - 08/12/20 1139    Visit Number 6    Number of Visits 12    Date for PT Re-Evaluation 08/27/20    Authorization Type [employer]    Authorization Time Period Cohere approved 12 pt visits from 07/16/20 thru 08/27/20 [id number]    Progress Note Due on Visit 10    PT Start Time 1130    PT Stop Time 1210    PT Time Calculation (min) 40 min    Activity Tolerance Patient tolerated treatment well    Behavior During Therapy [facility] for tasks assessed/performed           Past Medical History:  Diagnosis Date   Anemia    Cellulitis and abscess of leg    extends to foot   Diabetes mellitus type 2, diet-controlled ([place])    Frequent headaches    Heart murmur    High cholesterol    HTN (hypertension)    Hyperlipidemia     Past Surgical History:  Procedure Laterality Date   COLONOSCOPY WITH PROPOFOL N/A 05/27/2020   Procedure: COLONOSCOPY WITH PROPOFOL;  Surgeon: [name], MD;  [facility];  Service: Gastroenterology;  Laterality: N/A;  815   EYE SURGERY Right    detached retina   POLYPECTOMY  05/27/2020   Procedure: POLYPECTOMY;  Surgeon: [name], MD;  Location[facility];  Service: Gastroenterology;;   right shoulder  2011   rotator cuff repair, Aline Brochure, [facility]   SHOULDER OPEN ROTATOR CUFF REPAIR  10/29/2011   Procedure: left ROTATOR CUFF REPAIR SHOULDER OPEN;  Surgeon: Arther Abbott, MD;  Location: [facility];  Service: Orthopedics;  Laterality: Left;    There were no vitals filed for this visit.   Subjective Assessment - 08/12/20 1130    Subjective PT states that she  has no questions on her HEP; no pain at this time    Limitations House hold activities;Standing;Sitting;Walking    How long can you walk comfortably? 100 feet    Patient Stated Goals decrease pain    Currently in Pain? No/denies    Pain Onset More than a month ago                             [facility] Adult PT Treatment/Exercise - 08/12/20 0001      Exercises   Exercises Lumbar      Lumbar Exercises: Stretches   Active Hamstring Stretch 3 reps;30 seconds    Single Knee to Chest Stretch 3 reps;30 seconds    Prone on Elbows Stretch --   2 minutes   Other Lumbar Stretch Exercise hip excursions x3      Lumbar Exercises: Standing   Functional Squats 10 reps    Forward Lunge 10 reps    Other Standing Lumbar Exercises side stepping x 2 RT; 4" step up x 10" no Hand hold,    Other Standing Lumbar Exercises vector stance x 15" x 3; Paloff red tband x 10 each      Lumbar Exercises: Seated   Sit to Stand 10 reps      Lumbar Exercises: Prone  Straight Leg Raise 10 reps    Opposite Arm/Leg Raise Left arm/Right leg;Right arm/Left leg;10 reps                    PT Short Term Goals - 08/12/20 1214      PT SHORT TERM GOAL #1   Title Patient will be independent with HEP in order to improve functional outcomes.    Time 3    Period Weeks    Status Achieved    Target Date 08/06/20      PT SHORT TERM GOAL #2   Title Patient will report at least 25% improvement in symptoms for improved quality of life.    Time 3    Period Weeks    Status Achieved    Target Date 08/06/20             PT Long Term Goals - 08/12/20 1214      PT LONG TERM GOAL #1   Title Patient will report at least 75% improvement in symptoms for improved quality of life.    Time 6    Period Weeks    Status On-going      PT LONG TERM GOAL #2   Title Patient will improve FOTO score by at least 10 points in order to indicate improved tolerance to activity.    Time 6    Period Weeks     Status On-going      PT LONG TERM GOAL #3   Title Patient will demonstrate at least 25% improvement in lumbar ROM in all planes for improved ability to move trunk while completing chores    Time 6    Period Weeks    Status On-going                 Plan - 08/12/20 1140    Clinical Impression Statement Treatment focused both on improving lumbar ROM as well as improving gluteal maximus strength.  PT has good form with new exercises when verbally cued.     PT verbalizes that she not longer has pain with sweeping.    Personal Factors and Comorbidities Age;Comorbidity 3+;Time since onset of injury/illness/exacerbation;Past/Current Experience;Fitness    Comorbidities Arthritis, Asthma, Back pain, Diabetes Type I or II, High Blood Pressure    Examination-Activity Limitations Bend;Lift;Locomotion Level;Squat;Stairs;Stand;Transfers    Examination-Participation Restrictions Cleaning;Community Activity;Laundry;Yard Work;Volunteer;Shop;Meal Prep    Stability/Clinical Decision Making Stable/Uncomplicated    Rehab Potential Good    PT Frequency 2x / week    PT Duration 6 weeks    PT Treatment/Interventions ADLs/Self Care Home Management;Aquatic Therapy;Cryotherapy;Electrical Stimulation;Iontophoresis 4mg /ml Dexamethasone;Moist Heat;Ultrasound;Traction;DME Instruction;Gait training;Stair training;Functional mobility training;Therapeutic activities;Therapeutic exercise;Balance training;Neuromuscular re-education;Orthotic Fit/Training;Patient/family education;Manual techniques;Dry needling;Splinting;Energy conservation;Spinal Manipulations;Joint Manipulations    PT Next Visit Plan Progress core and proximal strengthening, lumbar mobility exercises.    PT Home Exercise Plan 11/30: isometric ab set, LTR, bridge; 12/9 later step ups, side stepping, backward walking, lumbar stretches: side bending, flexion    Consulted and Agree with Plan of Care Patient           Patient will benefit from skilled  therapeutic intervention in order to improve the following deficits and impairments:  Abnormal gait,Decreased range of motion,Difficulty walking,Decreased endurance,Decreased activity tolerance,Impaired perceived functional ability,Pain,Decreased balance,Improper body mechanics,Impaired flexibility,Hypomobility,Decreased mobility,Decreased strength,Postural dysfunction  Visit Diagnosis: Muscle weakness (generalized)  Chronic bilateral low back pain without sciatica  Other abnormalities of gait and mobility     Problem List Patient Active Problem List   Diagnosis Date Noted  De Quervain's disease (radial styloid tenosynovitis) 08/08/2012   Shoulder pain 11/17/2011   Shoulder stiffness 11/17/2011   Muscle weakness (generalized) 11/17/2011   S/P shoulder surgery 11/01/2011   Biceps tendon rupture, proximal 10/25/2011   Rotator cuff tear, left 10/25/2011   Rotator cuff tear 10/05/2011   Bursitis of shoulder, left 09/28/2011   Back pain 01/26/2011   IMPINGEMENT SYNDROME 09/29/2009   RUPTURE ROTATOR CUFF 09/29/2009   FIBROIDS, UTERUS 06/27/2009   DM 06/27/2009   HYPERLIPIDEMIA 06/27/2009   ANEMIA, IRON DEFICIENCY, MICROCYTIC 06/27/2009   GERD 06/27/2009   Rayetta Humphrey, PT CLT [phone] 08/12/2020, 12:14 PM  [place] [address] [place], [place], [address] Phone: [phone]   Fax:  [phone]  Name: Ann Vasquez MRN: [id number] Date of Birth: 09-Jun-1946

## 2020-08-13 DIAGNOSIS — Z23 Encounter for immunization: Secondary | ICD-10-CM | POA: Diagnosis not present

## 2020-08-14 ENCOUNTER — Telehealth (HOSPITAL_COMMUNITY): Payer: Self-pay | Admitting: Physical Therapy

## 2020-08-14 ENCOUNTER — Ambulatory Visit (HOSPITAL_COMMUNITY): Payer: Medicare PPO | Admitting: Physical Therapy

## 2020-08-14 NOTE — Telephone Encounter (Signed)
pt called to cx today's appt due to she had gotten her booster shot yesterday and her arm is sore.

## 2020-08-19 ENCOUNTER — Other Ambulatory Visit: Payer: Self-pay

## 2020-08-19 ENCOUNTER — Encounter (HOSPITAL_COMMUNITY): Payer: Self-pay | Admitting: Physical Therapy

## 2020-08-19 ENCOUNTER — Ambulatory Visit (HOSPITAL_COMMUNITY): Payer: Medicare PPO | Admitting: Physical Therapy

## 2020-08-19 DIAGNOSIS — R2689 Other abnormalities of gait and mobility: Secondary | ICD-10-CM | POA: Diagnosis not present

## 2020-08-19 DIAGNOSIS — M6281 Muscle weakness (generalized): Secondary | ICD-10-CM

## 2020-08-19 DIAGNOSIS — R29898 Other symptoms and signs involving the musculoskeletal system: Secondary | ICD-10-CM

## 2020-08-19 DIAGNOSIS — G8929 Other chronic pain: Secondary | ICD-10-CM | POA: Diagnosis not present

## 2020-08-19 DIAGNOSIS — M545 Low back pain, unspecified: Secondary | ICD-10-CM

## 2020-08-19 NOTE — Therapy (Signed)
[place] °[facility] °[address] °[place], [place], [address] °Phone: [phone]   Fax:  [phone] ° °Physical Therapy Treatment and Discharge Summary ° °Patient Details  °Name: Ann Vasquez °MRN: [id number] °Date of Birth: 02/11/1946 °Referring Provider (PT): Stanley Harrison MD ° °PHYSICAL THERAPY DISCHARGE SUMMARY ° °Visits from Start of Care: 7 ° °Current functional level related to goals / functional outcomes: °Patient met 3/3 STG and 3/3 LTG °  °Remaining deficits: °None °  °Education / Equipment: °Patient educated and provided with HEP materials/instructions °Plan: °Patient agrees to discharge.  Patient goals were met. Patient is being discharged due to meeting the stated rehab goals.  ?????  ° ° ° ° ° °Encounter Date: 08/19/2020 ° ° PT End of Session - 08/19/20 1152   ° Visit Number 7   ° Number of Visits 12   ° Date for PT Re-Evaluation 08/27/20   ° Authorization Type [employer]   ° Authorization Time Period Cohere approved 12 pt visits from 07/16/20 thru 08/27/20 auth#[id number]   ° Progress Note Due on Visit 10   ° PT Start Time 1135   ° PT Stop Time 1215   ° PT Time Calculation (min) 40 min   ° Activity Tolerance Patient tolerated treatment well   ° Behavior During Therapy WFL for tasks assessed/performed   °  °  °  ° ° °Past Medical History:  °Diagnosis Date  °• Anemia   °• Cellulitis and abscess of leg   ° extends to foot  °• Diabetes mellitus type 2, diet-controlled ([facility])   °• Frequent headaches   °• Heart murmur   °• High cholesterol   °• HTN (hypertension)   °• Hyperlipidemia   ° ° °Past Surgical History:  °Procedure Laterality Date  °• COLONOSCOPY WITH PROPOFOL N/A 05/27/2020  ° Procedure: COLONOSCOPY WITH PROPOFOL;  Surgeon: Castaneda Mayorga, Daniel, MD;  [facility];  Service: Gastroenterology;  Laterality: N/A;  815  °• EYE SURGERY Right   ° detached retina  °• POLYPECTOMY  05/27/2020  ° Procedure: POLYPECTOMY;  Surgeon: Castaneda  Mayorga, Daniel, MD;  [facility]: [facility];  Service: Gastroenterology;;  °• right shoulder  2011  ° rotator cuff repair, Harrison, [facility]  °• SHOULDER OPEN ROTATOR CUFF REPAIR  10/29/2011  ° Procedure: left ROTATOR CUFF REPAIR SHOULDER OPEN;  Surgeon: Stanley Harrison, MD;  Location: [facility];  Service: Orthopedics;  Laterality: Left;  ° ° °There were no vitals filed for this visit. ° ° Subjective Assessment - 08/19/20 1141   ° Subjective Patient reports she is feeling better overall and has not had any back pain in a couple weeks. Patient reports she has MD f/u on 12/30 for her back but does not feel any limitations at this time.   ° Limitations House hold activities;Standing;Sitting;Walking   ° How long can you walk comfortably? no issues   ° Patient Stated Goals decrease pain   ° Currently in Pain? No/denies   ° Pain Score 0-No pain   ° Pain Onset More than a month ago   °  °  °  ° ° ° ° ° [facility] PT Assessment - 08/19/20 0001   °  ° Assessment  ° Medical Diagnosis DDD, LBP   ° Referring Provider (PT) Stanley Harrison MD   ° Onset Date/Surgical Date 05/18/20   ° Next MD Visit 08/28/20   °  ° Observation/Other Assessments  ° Focus on Therapeutic Outcomes (FOTO)  94% function   °  °   AROM  ° AROM Assessment Site Lumbar   ° Lumbar Flexion WNL   ° Lumbar Extension WNL   ° Lumbar - Right Side Bend WNL   ° Lumbar - Left Side Bend WNL   ° Lumbar - Right Rotation WNL   ° Lumbar - Left Rotation WNL   °  ° Strength  ° Right/Left Hip Right;Left   ° Right Hip Flexion 5/5   ° Right Hip Extension 5/5   ° Left Hip Flexion 5/5   ° Left Hip Extension 5/5   °  ° Palpation  ° Palpation comment no tenderness to touch   °  °  °  ° ° ° ° ° ° ° ° ° ° ° ° ° ° ° ° [facility] Adult PT Treatment/Exercise - 08/19/20 0001   °  ° Lumbar Exercises: Stretches  ° Active Hamstring Stretch 3 reps;30 seconds   ° Single Knee to Chest Stretch 3 reps;30 seconds   ° Prone on Elbows Stretch Other (comment)   2-4 min  ° Other Lumbar Stretch Exercise seated forward  flexion 5x5sec hold, trunk rotations in sitting 5x   °  ° Lumbar Exercises: Aerobic  ° Nustep level 3, 5 minute warmup   °  ° Lumbar Exercises: Standing  ° Functional Squats 10 reps   ° Functional Squats Limitations 3D hip excursion, squats complete front of chair for mechanics   ° Other Standing Lumbar Exercises side stepping x 2 RT; 4" step up x 10" no Hand hold,   ° Other Standing Lumbar Exercises vector stance x 15" x 3; Paloff red tband x 10 each   °  ° Lumbar Exercises: Seated  ° Sit to Stand 10 reps   °  °  °  ° ° ° ° ° ° ° ° ° PT Education - 08/19/20 1157   ° Education Details Pt education in FOTO survey and educated on importance of maintaining HEP   ° Person(s) Educated Patient   ° Methods Explanation   ° Comprehension Verbalized understanding   °  °  °  ° ° ° PT Short Term Goals - 08/19/20 1150   °  ° PT SHORT TERM GOAL #1  ° Title Patient will be independent with HEP in order to improve functional outcomes.   ° Time 3   ° Period Weeks   ° Status Achieved   ° Target Date 08/06/20   °  ° PT SHORT TERM GOAL #2  ° Title Patient will report at least 25% improvement in symptoms for improved quality of life.   ° Baseline pt reports feeling 100% better, no pain   ° Time 3   ° Period Weeks   ° Status Achieved   ° Target Date 08/06/20   °  °  °  ° ° ° ° PT Long Term Goals - 08/19/20 1150   °  ° PT LONG TERM GOAL #1  ° Title Patient will report at least 75% improvement in symptoms for improved quality of life.   ° Baseline Patient reports 100% improvement, no pain   ° Time 6   ° Period Weeks   ° Status Achieved   °  ° PT LONG TERM GOAL #2  ° Title Patient will improve FOTO score by at least 10 points in order to indicate improved tolerance to activity.   ° Baseline current level: 94% function   ° Time 6   ° Period Weeks   ° Status Achieved   °  °   PT LONG TERM GOAL #3   Title Patient will demonstrate at least 25% improvement in lumbar ROM in all planes for improved ability to move trunk while completing chores     Baseline WNL, no pain    Time 6    Period Weeks    Status Achieved                 Plan - 08/19/20 1155    Clinical Impression Statement Patient reports global improvements and she has not had a back pain episode in a couple of weeks now and reports no functional limitations or restrictions due to her back.  Patient demonstrates proficiency with her HEP and has been compliant with home-based activities and is ready for D/C to HEP    Personal Factors and Comorbidities Age;Comorbidity 3+;Time since onset of injury/illness/exacerbation;Past/Current Experience;Fitness    Comorbidities Arthritis, Asthma, Back pain, Diabetes Type I or II, High Blood Pressure    Examination-Activity Limitations Bend;Lift;Locomotion Level;Squat;Stairs;Stand;Transfers    Examination-Participation Restrictions Cleaning;Community Activity;Laundry;Yard Work;Volunteer;Shop;Meal Prep    Stability/Clinical Decision Making Stable/Uncomplicated    Rehab Potential Good    PT Frequency 2x / week    PT Duration 6 weeks    PT Treatment/Interventions ADLs/Self Care Home Management;Aquatic Therapy;Cryotherapy;Electrical Stimulation;Iontophoresis 10m/ml Dexamethasone;Moist Heat;Ultrasound;Traction;DME Instruction;Gait training;Stair training;Functional mobility training;Therapeutic activities;Therapeutic exercise;Balance training;Neuromuscular re-education;Orthotic Fit/Training;Patient/family education;Manual techniques;Dry needling;Splinting;Energy conservation;Spinal Manipulations;Joint Manipulations    PT Next Visit Plan Progress core and proximal strengthening,DC to HEP     PT Home Exercise Plan 11/30: isometric ab set, LTR, bridge; 12/9 later step ups, side stepping, backward walking, lumbar stretches: side bending, flexion    Consulted and Agree with Plan of Care Patient           Patient will benefit from skilled therapeutic intervention in order to improve the following deficits and impairments:  Abnormal  gait,Decreased range of motion,Difficulty walking,Decreased endurance,Decreased activity tolerance,Impaired perceived functional ability,Pain,Decreased balance,Improper body mechanics,Impaired flexibility,Hypomobility,Decreased mobility,Decreased strength,Postural dysfunction  Visit Diagnosis: Chronic bilateral low back pain without sciatica  Muscle weakness (generalized)  Other abnormalities of gait and mobility  Low back pain, unspecified back pain laterality, unspecified chronicity, unspecified whether sciatica present  Other symptoms and signs involving the musculoskeletal system     Problem List Patient Active Problem List   Diagnosis Date Noted   DTennis MustQuervain's disease (radial styloid tenosynovitis) 08/08/2012   Shoulder pain 11/17/2011   Shoulder stiffness 11/17/2011   Muscle weakness (generalized) 11/17/2011   S/P shoulder surgery 11/01/2011   Biceps tendon rupture, proximal 10/25/2011   Rotator cuff tear, left 10/25/2011   Rotator cuff tear 10/05/2011   Bursitis of shoulder, left 09/28/2011   Back pain 01/26/2011   IMPINGEMENT SYNDROME 09/29/2009   RUPTURE ROTATOR CUFF 09/29/2009   FIBROIDS, UTERUS 06/27/2009   DM 06/27/2009   HYPERLIPIDEMIA 06/27/2009   ANEMIA, IRON DEFICIENCY, MICROCYTIC 06/27/2009   GERD 06/27/2009    12:20 PM, 08/19/20 M. KSherlyn Lees PT, DPT Physical Therapist- [place] Office Number: [phone] [id number] [address]Phone: [phone]  Fax:  [phone] Name: Ann PORADAMRN: [id number] of Birth: 903-15-1947

## 2020-08-21 ENCOUNTER — Ambulatory Visit (HOSPITAL_COMMUNITY): Payer: Medicare PPO | Admitting: Physical Therapy

## 2020-08-26 ENCOUNTER — Ambulatory Visit (HOSPITAL_COMMUNITY): Payer: Medicare PPO | Admitting: Physical Therapy

## 2020-08-28 ENCOUNTER — Ambulatory Visit (HOSPITAL_COMMUNITY): Payer: Medicare PPO | Admitting: Physical Therapy

## 2020-08-28 ENCOUNTER — Ambulatory Visit (INDEPENDENT_AMBULATORY_CARE_PROVIDER_SITE_OTHER): Payer: Medicare PPO | Admitting: Orthopedic Surgery

## 2020-08-28 ENCOUNTER — Encounter: Payer: Self-pay | Admitting: Orthopedic Surgery

## 2020-08-28 ENCOUNTER — Other Ambulatory Visit: Payer: Self-pay

## 2020-08-28 VITALS — BP 156/62 | HR 76 | Ht 59.0 in | Wt 139.0 lb

## 2020-08-28 DIAGNOSIS — M545 Low back pain, unspecified: Secondary | ICD-10-CM | POA: Diagnosis not present

## 2020-08-28 DIAGNOSIS — G8929 Other chronic pain: Secondary | ICD-10-CM

## 2020-08-28 DIAGNOSIS — M5136 Other intervertebral disc degeneration, lumbar region: Secondary | ICD-10-CM | POA: Diagnosis not present

## 2020-08-28 NOTE — Patient Instructions (Signed)
Do exercises

## 2020-08-28 NOTE — Progress Notes (Signed)
FOLLOW-UP OFFICE VISIT   Encounter Diagnoses  Name Primary?  . Chronic midline low back pain without sciatica Yes  . DDD (degenerative disc disease), lumbar     74 year old female here for follow-up regarding her lower back pain.  She says she is doing very well and if she has any symptoms she will do her exercises and it seems to take care of the problem  She was seen by physical therapy. She bent over to pick something up and felt a snap in her back went to the ER had x-rays.  She complains of lower back pain no leg pain no red flags she is diabetic she took Robaxin and prednisone and got a little bit of relief but still symptomatic interfering with her activities of daily living     Review of Systems  Gastrointestinal: Positive for heartburn.  Musculoskeletal: Positive for back pain and myalgias.   Physical Therapy Treatment and Discharge Summary   Patient Details  Name: Ann Vasquez MRN: [id number] Date of Birth: Oct 17, 1945 Referring Provider (PT): Arther Abbott MD   PHYSICAL THERAPY DISCHARGE SUMMARY   Visits from Start of Care: 7   Current functional level related to goals / functional outcomes: Patient met 3/3 STG and 3/3 LTG  (and prior treatment)  + EXAM FINDINGS:   Normal gait  No tenderness in the back  Normal flexion she can touch her toes and normal extension without pain  Tiptoe test was normal and straight leg raises were negative  ASSESSMENT AND PLAN Encounter Diagnoses  Name Primary?  . Chronic midline low back pain without sciatica Yes  . DDD (degenerative disc disease), lumbar     Improved  Exercises as needed follow-up as needed

## 2020-10-31 DIAGNOSIS — E1165 Type 2 diabetes mellitus with hyperglycemia: Secondary | ICD-10-CM | POA: Diagnosis not present

## 2020-10-31 DIAGNOSIS — I10 Essential (primary) hypertension: Secondary | ICD-10-CM | POA: Diagnosis not present

## 2020-10-31 DIAGNOSIS — E119 Type 2 diabetes mellitus without complications: Secondary | ICD-10-CM | POA: Diagnosis not present

## 2020-10-31 DIAGNOSIS — Z6828 Body mass index (BMI) 28.0-28.9, adult: Secondary | ICD-10-CM | POA: Diagnosis not present

## 2020-10-31 DIAGNOSIS — E7849 Other hyperlipidemia: Secondary | ICD-10-CM | POA: Diagnosis not present

## 2020-11-03 DIAGNOSIS — E7849 Other hyperlipidemia: Secondary | ICD-10-CM | POA: Diagnosis not present

## 2020-11-03 DIAGNOSIS — I10 Essential (primary) hypertension: Secondary | ICD-10-CM | POA: Diagnosis not present

## 2020-11-03 DIAGNOSIS — E1165 Type 2 diabetes mellitus with hyperglycemia: Secondary | ICD-10-CM | POA: Diagnosis not present

## 2020-11-03 DIAGNOSIS — Z6828 Body mass index (BMI) 28.0-28.9, adult: Secondary | ICD-10-CM | POA: Diagnosis not present

## 2020-11-03 DIAGNOSIS — R5383 Other fatigue: Secondary | ICD-10-CM | POA: Diagnosis not present

## 2020-11-28 DIAGNOSIS — E119 Type 2 diabetes mellitus without complications: Secondary | ICD-10-CM | POA: Diagnosis not present

## 2021-01-16 DIAGNOSIS — I10 Essential (primary) hypertension: Secondary | ICD-10-CM | POA: Diagnosis not present

## 2021-01-16 DIAGNOSIS — Z6828 Body mass index (BMI) 28.0-28.9, adult: Secondary | ICD-10-CM | POA: Diagnosis not present

## 2021-01-16 DIAGNOSIS — R600 Localized edema: Secondary | ICD-10-CM | POA: Diagnosis not present

## 2021-01-16 DIAGNOSIS — E119 Type 2 diabetes mellitus without complications: Secondary | ICD-10-CM | POA: Diagnosis not present

## 2021-01-16 DIAGNOSIS — E663 Overweight: Secondary | ICD-10-CM | POA: Diagnosis not present

## 2021-02-28 DIAGNOSIS — Z20822 Contact with and (suspected) exposure to covid-19: Secondary | ICD-10-CM | POA: Diagnosis not present

## 2021-04-16 DIAGNOSIS — B349 Viral infection, unspecified: Secondary | ICD-10-CM | POA: Diagnosis not present

## 2021-05-19 HISTORY — PX: WISDOM TOOTH EXTRACTION: SHX21

## 2021-05-30 ENCOUNTER — Emergency Department (HOSPITAL_COMMUNITY)
Admission: EM | Admit: 2021-05-30 | Discharge: 2021-05-31 | Disposition: A | Discharge status: Home / Self Care | Payer: Medicare Other | Attending: Emergency Medicine | Admitting: Emergency Medicine

## 2021-05-30 ENCOUNTER — Encounter (HOSPITAL_COMMUNITY): Payer: Self-pay

## 2021-05-30 ENCOUNTER — Other Ambulatory Visit: Payer: Self-pay

## 2021-05-30 DIAGNOSIS — R0981 Nasal congestion: Secondary | ICD-10-CM | POA: Diagnosis present

## 2021-05-30 DIAGNOSIS — I10 Essential (primary) hypertension: Secondary | ICD-10-CM | POA: Insufficient documentation

## 2021-05-30 DIAGNOSIS — R42 Dizziness and giddiness: Secondary | ICD-10-CM | POA: Diagnosis not present

## 2021-05-30 DIAGNOSIS — E119 Type 2 diabetes mellitus without complications: Secondary | ICD-10-CM | POA: Diagnosis not present

## 2021-05-30 DIAGNOSIS — Z20822 Contact with and (suspected) exposure to covid-19: Secondary | ICD-10-CM | POA: Diagnosis not present

## 2021-05-30 DIAGNOSIS — J019 Acute sinusitis, unspecified: Secondary | ICD-10-CM | POA: Diagnosis not present

## 2021-05-30 LAB — CBG MONITORING, ED: Glucose-Capillary: 130 mg/dL — ABNORMAL HIGH (ref 70–99)

## 2021-05-30 NOTE — ED Triage Notes (Signed)
Pt arrived via POV c/o dizzy spells at home and hyperglycemia. Pts CBG in Triage is 130 and Pt reports taking Metformin for DM II control. Pt ambulatory to Triage and denies falling recently. Pt endorses sore throat in the morning which is relieved with OTC Corticidin. Pt endorses Hx of Vertigo as well but reports not taking any medication for this currently.

## 2021-05-31 DIAGNOSIS — R42 Dizziness and giddiness: Secondary | ICD-10-CM | POA: Diagnosis not present

## 2021-05-31 DIAGNOSIS — J019 Acute sinusitis, unspecified: Secondary | ICD-10-CM | POA: Diagnosis not present

## 2021-05-31 LAB — RESP PANEL BY RT-PCR (FLU A&B, COVID) ARPGX2
Influenza A by PCR: NEGATIVE
Influenza B by PCR: NEGATIVE
SARS Coronavirus 2 by RT PCR: NEGATIVE

## 2021-05-31 NOTE — Discharge Instructions (Addendum)
You were evaluated in the Emergency Department and after careful evaluation, we did not find any emergent condition requiring admission or further testing in the hospital.  Your exam/testing today was overall reassuring.  Symptoms likely due to a sinus infection, likely due to a virus.  Recommend warm humidified air as we discussed, Tylenol or Motrin for discomfort.  Check your COVID test results in the morning.  Please return to the Emergency Department if you experience any worsening of your condition.  Thank you for allowing [place] to be a part of your care.

## 2021-05-31 NOTE — ED Provider Notes (Signed)
[facility] Emergency Department Provider Note MRN:  [id number]  Arrival date & time: 05/31/21     Chief Complaint   Nasal congestion History of Present Illness   Ann Vasquez is a 75 y.o. year-old female with a history of hypertension, diabetes presenting to the ED with chief complaint of nasal congestion.  4 days of nasal congestion, trouble breathing out of the nose, pressure feeling in the front of the face and the maxillary teeth.  Sometimes gets lightheaded when she stands up during this time period.  Denies fever.  Mild cough.  No headache or vision change, no chest pain or shortness of breath, no abdominal pain.  Symptoms mild to moderate, constant, no exacerbating or alleviating factors.  Review of Systems  A complete 10 system review of systems was obtained and all systems are negative except as noted in the HPI and PMH.   Patient's Health History    Past Medical History:  Diagnosis Date   Anemia    Cellulitis and abscess of leg    extends to foot   Diabetes mellitus type 2, diet-controlled (HCC)    Frequent headaches    Heart murmur    High cholesterol    HTN (hypertension)    Hyperlipidemia     Past Surgical History:  Procedure Laterality Date   COLONOSCOPY WITH PROPOFOL N/A 05/27/2020   Procedure: COLONOSCOPY WITH PROPOFOL;  Surgeon: [name], MD;  [facility];  Service: Gastroenterology;  Laterality: N/A;  815   EYE SURGERY Right    detached retina   POLYPECTOMY  05/27/2020   Procedure: POLYPECTOMY;  Surgeon: [name], MD;  [facility];  Service: Gastroenterology;;   right shoulder  2011   rotator cuff repair, Aline Brochure, [facility]   SHOULDER OPEN ROTATOR CUFF REPAIR  10/29/2011   Procedure: left ROTATOR CUFF REPAIR SHOULDER OPEN;  Surgeon: Arther Abbott, MD;  Location: [facility];  Service: Orthopedics;  Laterality: Left;   WISDOM TOOTH EXTRACTION Right 05/19/2021    Family  History  Problem Relation Age of Onset   Cancer Paternal Grandmother    Anesthesia problems Neg Hx    Malignant hyperthermia Neg Hx    Hypotension Neg Hx    Pseudochol deficiency Neg Hx     Social History   Socioeconomic History   Marital status: Widowed    Spouse name: Not on file   Number of children: Not on file   Years of education: 12th grade   Highest education level: Not on file  Occupational History   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    Employer: [employer]  Tobacco Use   Smoking status: Never   Smokeless tobacco: Never  Vaping Use   Vaping Use: Never used  Substance and Sexual Activity   Alcohol use: No   Drug use: No   Sexual activity: Never  Other Topics Concern   Not on file  Social History Narrative   Not on file   Social Determinants of Health   Financial Resource Strain: Not on file  Food Insecurity: Not on file  Transportation Needs: Not on file  Physical Activity: Not on file  Stress: Not on file  Social Connections: Not on file  Intimate Partner Violence: Not on file     Physical Exam   Vitals:   05/31/21 0052 05/31/21 0100  BP: 123/64 (!) 150/68  Pulse: 64 60  Resp: 18 20  Temp: 98.3 F (36.8 C)   SpO2: 100%  100%    CONSTITUTIONAL: Well-appearing, NAD NEURO:  Alert and oriented x 3, no focal deficits EYES:  eyes equal and reactive ENT/NECK:  no LAD, no JVD CARDIO: Regular rate, well-perfused, normal S1 and S2 PULM:  CTAB no wheezing or rhonchi GI/GU:  normal bowel sounds, non-distended, non-tender MSK/SPINE:  No gross deformities, no edema SKIN:  no rash, atraumatic PSYCH:  Appropriate speech and behavior  *Additional and/or pertinent findings included in MDM below  Diagnostic and Interventional Summary    EKG Interpretation  Date/Time:  Sunday May 31 2021 01:02:28 EDT Ventricular Rate:  72 PR Interval:  144 QRS Duration: 91 QT Interval:  406 QTC Calculation: 445 R Axis:   43 Text  Interpretation: Sinus rhythm Baseline wander in lead(s) I III aVL Confirmed by Gerlene Fee [phone]) on 05/31/2021 1:11:07 AM       Labs Reviewed  CBG MONITORING, ED - Abnormal; Notable for the following components:      Result Value   Glucose-Capillary 130 (*)    All other components within normal limits  RESP PANEL BY RT-PCR (FLU A&B, COVID) [id number]    No orders to display    Medications - No data to display   Procedures  /  Critical Care Procedures  ED Course and Medical Decision Making  I have reviewed the triage vital signs, the nursing notes, and pertinent available records from the EMR.  Listed above are laboratory and imaging tests that I personally ordered, reviewed, and interpreted and then considered in my medical decision making (see below for details).  Symptoms seem consistent with a sinusitis, likely viral.  Well-appearing, overall normal exam, no neurological deficits, the dizziness is a lightheadedness that seems most likely due to orthostatic hypotension.  EKG and blood sugar reassuring.  Patient is appropriate for discharge.       Barth Kirks. Sedonia Small, MD [place] [email or web address]  Final Clinical Impressions(s) / ED Diagnoses     ICD-10-CM   1. Acute sinusitis, recurrence not specified, unspecified location  J01.90       ED Discharge Orders     None        Discharge Instructions Discussed with and Provided to Patient:    Discharge Instructions      You were evaluated in the Emergency Department and after careful evaluation, we did not find any emergent condition requiring admission or further testing in the hospital.  Your exam/testing today was overall reassuring.  Symptoms likely due to a sinus infection, likely due to a virus.  Recommend warm humidified air as we discussed, Tylenol or Motrin for discomfort.  Check your COVID test results in the morning.  Please return to the Emergency  Department if you experience any worsening of your condition.  Thank you for allowing [place] to be a part of your care.        Maudie Flakes, MD 05/31/21 [phone]

## 2021-06-02 DIAGNOSIS — J019 Acute sinusitis, unspecified: Secondary | ICD-10-CM | POA: Diagnosis not present

## 2021-07-08 ENCOUNTER — Other Ambulatory Visit (HOSPITAL_COMMUNITY): Payer: Self-pay | Admitting: Family Medicine

## 2021-07-08 DIAGNOSIS — Z1231 Encounter for screening mammogram for malignant neoplasm of breast: Secondary | ICD-10-CM

## 2021-07-15 ENCOUNTER — Other Ambulatory Visit: Payer: Self-pay

## 2021-07-15 ENCOUNTER — Ambulatory Visit (HOSPITAL_COMMUNITY)
Admission: RE | Admit: 2021-07-15 | Discharge: 2021-07-15 | Disposition: A | Discharge status: Home / Self Care | Payer: Medicare Other | Source: Ambulatory Visit | Attending: Family Medicine | Admitting: Family Medicine

## 2021-07-15 DIAGNOSIS — Z1231 Encounter for screening mammogram for malignant neoplasm of breast: Secondary | ICD-10-CM | POA: Diagnosis not present

## 2021-08-06 DIAGNOSIS — Z20822 Contact with and (suspected) exposure to covid-19: Secondary | ICD-10-CM | POA: Diagnosis not present

## 2021-08-06 DIAGNOSIS — Z23 Encounter for immunization: Secondary | ICD-10-CM | POA: Diagnosis not present

## 2021-08-18 DIAGNOSIS — Z20822 Contact with and (suspected) exposure to covid-19: Secondary | ICD-10-CM | POA: Diagnosis not present

## 2021-08-28 ENCOUNTER — Ambulatory Visit: Payer: Medicare Other

## 2021-08-28 ENCOUNTER — Encounter: Payer: Self-pay | Admitting: Orthopedic Surgery

## 2021-08-28 ENCOUNTER — Other Ambulatory Visit: Payer: Self-pay

## 2021-08-28 ENCOUNTER — Ambulatory Visit (INDEPENDENT_AMBULATORY_CARE_PROVIDER_SITE_OTHER): Payer: Medicare Other | Admitting: Orthopedic Surgery

## 2021-08-28 VITALS — BP 155/74 | HR 70 | Ht 59.0 in | Wt 139.0 lb

## 2021-08-28 DIAGNOSIS — S9031XA Contusion of right foot, initial encounter: Secondary | ICD-10-CM | POA: Diagnosis not present

## 2021-08-28 DIAGNOSIS — M79671 Pain in right foot: Secondary | ICD-10-CM

## 2021-08-28 NOTE — Progress Notes (Signed)
New Patient Visit  Assessment: Ann Vasquez is a 75 y.o. female with the following: 1. Contusion of right foot, initial encounter  Plan: Reviewed radiographs with patient in clinic today.  These are negative for any acute injury.  No evidence of chronic injury, or degenerative changes.  Reassured the patient that this is likely a bruise, which will continue to improve, but may linger if the bone is bruised.  She stated understanding.  Elevate the foot to assist with swelling.  Medications as needed.  Regards to her small toe, I recommended to adjust her footwear, and consider a toe spacer to prevent continued curling of the small toe and associated pain.  Follow-up as needed.   Follow-up: Return if symptoms worsen or fail to improve.  Subjective:  Chief Complaint  Patient presents with   Foot Injury    RT foot Large can fell on foot DOI 08/21/21    History of Present Illness: Ann Vasquez is a 75 y.o. female who presents for evaluation of her right foot pain.  Approximately 1 week ago, she was shopping in the grocery store, when someone dropped a large can directly onto her right foot.  She noted immediate pain.  She has been able to walk.  She has not changed footwear.  She has tried taking some medications, but does not like taking medications.  She states that she is taking a couple of Tylenol.  In addition, she has noticed that her right small toe has been curling underneath.  This causes her some discomfort.  She saw another provider, who recommended a toe sleeve, but this was not effective.   Review of Systems: No fevers or chills No numbness or tingling No chest pain No shortness of breath No bowel or bladder dysfunction No GI distress No headaches   Medical History:  Past Medical History:  Diagnosis Date   Anemia    Cellulitis and abscess of leg    extends to foot   Diabetes mellitus type 2, diet-controlled (HCC)    Frequent headaches    Heart  murmur    High cholesterol    HTN (hypertension)    Hyperlipidemia     Past Surgical History:  Procedure Laterality Date   COLONOSCOPY WITH PROPOFOL N/A 05/27/2020   Procedure: COLONOSCOPY WITH PROPOFOL;  Surgeon: [name], MD;  [facility];  Service: Gastroenterology;  Laterality: N/A;  815   EYE SURGERY Right    detached retina   POLYPECTOMY  05/27/2020   Procedure: POLYPECTOMY;  Surgeon: [name], MD;  [facility];  Service: Gastroenterology;;   right shoulder  2011   rotator cuff repair, Aline Brochure, [facility]   SHOULDER OPEN ROTATOR CUFF REPAIR  10/29/2011   Procedure: left ROTATOR CUFF REPAIR SHOULDER OPEN;  Surgeon: Arther Abbott, MD;  [facility];  Service: Orthopedics;  Laterality: Left;   WISDOM TOOTH EXTRACTION Right 05/19/2021    Family History  Problem Relation Age of Onset   Cancer Paternal Grandmother    Anesthesia problems Neg Hx    Malignant hyperthermia Neg Hx    Hypotension Neg Hx    Pseudochol deficiency Neg Hx    Social History   Tobacco Use   Smoking status: Never   Smokeless tobacco: Never  Vaping Use   Vaping Use: Never used  Substance Use Topics   Alcohol use: No   Drug use: No    No Known Allergies  Current Meds  Medication Sig  amLODipine-valsartan (EXFORGE) 5-320 MG tablet Take 0.5 tablets by mouth daily.    metFORMIN (GLUCOPHAGE-XR) 500 MG 24 hr tablet Take 500 mg by mouth every morning.   rosuvastatin (CRESTOR) 10 MG tablet Take 10 mg by mouth at bedtime.     Objective: BP (!) 155/74    Pulse 70    Ht 4\' 11"  (1.499 m)    Wt 139 lb (63 kg)    BMI 28.07 kg/m   Physical Exam:  General: Alert and oriented. and No acute distress. Gait: Normal gait.  Evaluation of the right foot demonstrates no swelling.  No bruising is appreciated.  Mild tenderness to palpation along the distal aspect of the first metatarsal shaft.  Active motion intact to the great toe.  Sensation is intact  throughout the right foot.  Evaluation of the lateral border of her foot is without obvious callus formation.  No tenderness to palpation.  Mild deformity of the small toe.   IMAGING: I personally ordered and reviewed the following images  X-ray of the right foot demonstrates no acute injury.  No dislocations are noted.  No evidence of degenerative changes.  These are nonweightbearing x-rays.  Impression: Normal right foot x-rays  New Medications:  No orders of the defined types were placed in this encounter.     Mordecai Rasmussen, MD  08/28/2021 12:38 PM

## 2021-09-14 DIAGNOSIS — Z6828 Body mass index (BMI) 28.0-28.9, adult: Secondary | ICD-10-CM | POA: Diagnosis not present

## 2021-09-14 DIAGNOSIS — E663 Overweight: Secondary | ICD-10-CM | POA: Diagnosis not present

## 2021-09-14 DIAGNOSIS — Z1389 Encounter for screening for other disorder: Secondary | ICD-10-CM | POA: Diagnosis not present

## 2021-09-14 DIAGNOSIS — E559 Vitamin D deficiency, unspecified: Secondary | ICD-10-CM | POA: Diagnosis not present

## 2021-09-20 DIAGNOSIS — Z20822 Contact with and (suspected) exposure to covid-19: Secondary | ICD-10-CM | POA: Diagnosis not present

## 2021-09-28 DIAGNOSIS — Z20822 Contact with and (suspected) exposure to covid-19: Secondary | ICD-10-CM | POA: Diagnosis not present

## 2021-10-19 DIAGNOSIS — Z20822 Contact with and (suspected) exposure to covid-19: Secondary | ICD-10-CM | POA: Diagnosis not present

## 2021-11-13 DIAGNOSIS — Z20822 Contact with and (suspected) exposure to covid-19: Secondary | ICD-10-CM | POA: Diagnosis not present

## 2021-11-17 DIAGNOSIS — Z20822 Contact with and (suspected) exposure to covid-19: Secondary | ICD-10-CM | POA: Diagnosis not present

## 2021-11-19 DIAGNOSIS — Z20822 Contact with and (suspected) exposure to covid-19: Secondary | ICD-10-CM | POA: Diagnosis not present

## 2021-11-22 DIAGNOSIS — U071 COVID-19: Secondary | ICD-10-CM | POA: Diagnosis not present

## 2021-11-30 DIAGNOSIS — Z20822 Contact with and (suspected) exposure to covid-19: Secondary | ICD-10-CM | POA: Diagnosis not present

## 2021-12-02 DIAGNOSIS — Z20822 Contact with and (suspected) exposure to covid-19: Secondary | ICD-10-CM | POA: Diagnosis not present

## 2021-12-04 DIAGNOSIS — Z20822 Contact with and (suspected) exposure to covid-19: Secondary | ICD-10-CM | POA: Diagnosis not present

## 2021-12-16 DIAGNOSIS — Z20822 Contact with and (suspected) exposure to covid-19: Secondary | ICD-10-CM | POA: Diagnosis not present

## 2021-12-20 DIAGNOSIS — Z20822 Contact with and (suspected) exposure to covid-19: Secondary | ICD-10-CM | POA: Diagnosis not present

## 2021-12-21 DIAGNOSIS — U071 COVID-19: Secondary | ICD-10-CM | POA: Diagnosis not present

## 2021-12-25 DIAGNOSIS — Z20822 Contact with and (suspected) exposure to covid-19: Secondary | ICD-10-CM | POA: Diagnosis not present

## 2021-12-28 DIAGNOSIS — Z20822 Contact with and (suspected) exposure to covid-19: Secondary | ICD-10-CM | POA: Diagnosis not present

## 2021-12-31 DIAGNOSIS — U071 COVID-19: Secondary | ICD-10-CM | POA: Diagnosis not present

## 2022-01-14 DIAGNOSIS — E663 Overweight: Secondary | ICD-10-CM | POA: Diagnosis not present

## 2022-01-14 DIAGNOSIS — Z6827 Body mass index (BMI) 27.0-27.9, adult: Secondary | ICD-10-CM | POA: Diagnosis not present

## 2022-01-14 DIAGNOSIS — J069 Acute upper respiratory infection, unspecified: Secondary | ICD-10-CM | POA: Diagnosis not present

## 2022-01-14 DIAGNOSIS — J019 Acute sinusitis, unspecified: Secondary | ICD-10-CM | POA: Diagnosis not present

## 2022-06-18 DIAGNOSIS — Z1331 Encounter for screening for depression: Secondary | ICD-10-CM | POA: Diagnosis not present

## 2022-06-18 DIAGNOSIS — H6980 Other specified disorders of Eustachian tube, unspecified ear: Secondary | ICD-10-CM | POA: Diagnosis not present

## 2022-06-18 DIAGNOSIS — E1165 Type 2 diabetes mellitus with hyperglycemia: Secondary | ICD-10-CM | POA: Diagnosis not present

## 2022-06-18 DIAGNOSIS — E782 Mixed hyperlipidemia: Secondary | ICD-10-CM | POA: Diagnosis not present

## 2022-06-18 DIAGNOSIS — I10 Essential (primary) hypertension: Secondary | ICD-10-CM | POA: Diagnosis not present

## 2022-06-18 DIAGNOSIS — Z6827 Body mass index (BMI) 27.0-27.9, adult: Secondary | ICD-10-CM | POA: Diagnosis not present

## 2022-06-18 DIAGNOSIS — Z0001 Encounter for general adult medical examination with abnormal findings: Secondary | ICD-10-CM | POA: Diagnosis not present

## 2022-06-18 DIAGNOSIS — E663 Overweight: Secondary | ICD-10-CM | POA: Diagnosis not present

## 2022-06-19 LAB — COMPREHENSIVE METABOLIC PANEL: Calcium: 10.5 (ref 8.7–10.7)

## 2022-06-24 DIAGNOSIS — Z23 Encounter for immunization: Secondary | ICD-10-CM | POA: Diagnosis not present

## 2022-08-17 ENCOUNTER — Other Ambulatory Visit (HOSPITAL_COMMUNITY): Payer: Self-pay | Admitting: Family Medicine

## 2022-08-17 DIAGNOSIS — Z1231 Encounter for screening mammogram for malignant neoplasm of breast: Secondary | ICD-10-CM

## 2022-08-20 ENCOUNTER — Ambulatory Visit (HOSPITAL_COMMUNITY)
Admission: RE | Admit: 2022-08-20 | Discharge: 2022-08-20 | Disposition: A | Discharge status: Home / Self Care | Payer: Medicare Other | Source: Ambulatory Visit | Attending: Family Medicine | Admitting: Family Medicine

## 2022-08-20 DIAGNOSIS — Z1231 Encounter for screening mammogram for malignant neoplasm of breast: Secondary | ICD-10-CM | POA: Diagnosis not present

## 2022-08-31 ENCOUNTER — Other Ambulatory Visit (HOSPITAL_COMMUNITY): Payer: Self-pay | Admitting: Family Medicine

## 2022-08-31 DIAGNOSIS — R928 Other abnormal and inconclusive findings on diagnostic imaging of breast: Secondary | ICD-10-CM

## 2022-09-02 ENCOUNTER — Ambulatory Visit (HOSPITAL_COMMUNITY)
Admission: RE | Admit: 2022-09-02 | Discharge: 2022-09-02 | Disposition: A | Discharge status: Home / Self Care | Payer: Medicare Other | Source: Ambulatory Visit | Attending: Family Medicine | Admitting: Family Medicine

## 2022-09-02 DIAGNOSIS — R928 Other abnormal and inconclusive findings on diagnostic imaging of breast: Secondary | ICD-10-CM | POA: Insufficient documentation

## 2022-09-02 DIAGNOSIS — N6012 Diffuse cystic mastopathy of left breast: Secondary | ICD-10-CM | POA: Diagnosis not present

## 2022-10-16 DIAGNOSIS — J069 Acute upper respiratory infection, unspecified: Secondary | ICD-10-CM | POA: Diagnosis not present

## 2022-10-16 DIAGNOSIS — R030 Elevated blood-pressure reading, without diagnosis of hypertension: Secondary | ICD-10-CM | POA: Diagnosis not present

## 2022-10-16 DIAGNOSIS — E663 Overweight: Secondary | ICD-10-CM | POA: Diagnosis not present

## 2022-10-16 DIAGNOSIS — Z6827 Body mass index (BMI) 27.0-27.9, adult: Secondary | ICD-10-CM | POA: Diagnosis not present

## 2022-10-28 ENCOUNTER — Encounter: Payer: Self-pay | Admitting: Radiology

## 2022-10-31 ENCOUNTER — Ambulatory Visit
Admission: EM | Admit: 2022-10-31 | Discharge: 2022-10-31 | Disposition: A | Discharge status: Home / Self Care | Payer: Medicare Other | Attending: Nurse Practitioner | Admitting: Nurse Practitioner

## 2022-10-31 ENCOUNTER — Ambulatory Visit (INDEPENDENT_AMBULATORY_CARE_PROVIDER_SITE_OTHER): Payer: Medicare Other

## 2022-10-31 DIAGNOSIS — Z8639 Personal history of other endocrine, nutritional and metabolic disease: Secondary | ICD-10-CM

## 2022-10-31 DIAGNOSIS — J069 Acute upper respiratory infection, unspecified: Secondary | ICD-10-CM

## 2022-10-31 DIAGNOSIS — E119 Type 2 diabetes mellitus without complications: Secondary | ICD-10-CM | POA: Diagnosis not present

## 2022-10-31 DIAGNOSIS — R059 Cough, unspecified: Secondary | ICD-10-CM | POA: Diagnosis not present

## 2022-10-31 DIAGNOSIS — R509 Fever, unspecified: Secondary | ICD-10-CM | POA: Diagnosis not present

## 2022-10-31 DIAGNOSIS — R0602 Shortness of breath: Secondary | ICD-10-CM | POA: Diagnosis not present

## 2022-10-31 DIAGNOSIS — Z7984 Long term (current) use of oral hypoglycemic drugs: Secondary | ICD-10-CM | POA: Diagnosis not present

## 2022-10-31 LAB — POCT INFLUENZA A/B
Influenza A, POC: NEGATIVE
Influenza B, POC: NEGATIVE

## 2022-10-31 LAB — POCT FASTING CBG KUC MANUAL ENTRY: POCT Glucose (KUC): 155 mg/dL — AB (ref 70–99)

## 2022-10-31 MED ORDER — AZITHROMYCIN 250 MG PO TABS: 250.0000 mg | ORAL_TABLET | Freq: Every day | ORAL | 0 refills | Status: DC

## 2022-10-31 MED ORDER — BENZONATATE 100 MG PO CAPS: 100.0000 mg | ORAL_CAPSULE | Freq: Three times a day (TID) | ORAL | 0 refills | Status: DC

## 2022-10-31 NOTE — ED Provider Notes (Signed)
[facility]    CSN: [id number] Arrival date & time: 10/31/22  [id number]      History   Chief Complaint Chief Complaint  Patient presents with   Cough   Generalized Body Aches         HPI Ann Vasquez is a 77 y.o. female.   The history is provided by the patient.   The patient presents with a 2-week history of generalized body aches, fatigue, cough.  Patient states over the last several days, has been experiencing chest tightness and shortness of breath.  Patient states when her symptoms started, about 3 days later, she did go to her primary care physician's office.  She states he tested her for COVID which was negative.  She states that she was advised to take over-the-counter cough and cold medications for her symptoms.  She states she took the medication, but when she got up to begin moving around she began feeling bad again.  She denies new fever, chills, wheezing, abdominal pain, nausea, vomiting, or diarrhea.  Patient states that she has experienced sore throat which is new.  She states she has been taking Tylenol for her symptoms.  Patient with a history of type 2 diabetes.  She states that her diabetes is mostly controlled.  She states she has not been able to check her blood glucose levels for the past 2 days because she ran out of strips and she is waiting on her doctor's office to order more for her. Past Medical History:  Diagnosis Date   Anemia    Cellulitis and abscess of leg    extends to foot   Diabetes mellitus type 2, diet-controlled (HCC)    Frequent headaches    Heart murmur    High cholesterol    HTN (hypertension)    Hyperlipidemia     Patient Active Problem List   Diagnosis Date Noted   Tennis Must Quervain's disease (radial styloid tenosynovitis) 08/08/2012   Shoulder pain 11/17/2011   Shoulder stiffness 11/17/2011   Muscle weakness (generalized) 11/17/2011   S/P shoulder surgery 11/01/2011   Biceps tendon rupture, proximal 10/25/2011   Rotator  cuff tear, left 10/25/2011   Rotator cuff tear 10/05/2011   Bursitis of shoulder, left 09/28/2011   Back pain 01/26/2011   IMPINGEMENT SYNDROME 09/29/2009   RUPTURE ROTATOR CUFF 09/29/2009   FIBROIDS, UTERUS 06/27/2009   DM 06/27/2009   HYPERLIPIDEMIA 06/27/2009   ANEMIA, IRON DEFICIENCY, MICROCYTIC 06/27/2009   GERD 06/27/2009    Past Surgical History:  Procedure Laterality Date   COLONOSCOPY WITH PROPOFOL N/A 05/27/2020   Procedure: COLONOSCOPY WITH PROPOFOL;  Surgeon: [name], MD;  [facility];  Service: Gastroenterology;  Laterality: N/A;  815   EYE SURGERY Right    detached retina   POLYPECTOMY  05/27/2020   Procedure: POLYPECTOMY;  Surgeon: [name], MD;  [facility];  Service: Gastroenterology;;   right shoulder  2011   rotator cuff repair, Aline Brochure, [facility]   SHOULDER OPEN ROTATOR CUFF REPAIR  10/29/2011   Procedure: left ROTATOR CUFF REPAIR SHOULDER OPEN;  Surgeon: Arther Abbott, MD;  Location: [facility];  Service: Orthopedics;  Laterality: Left;   WISDOM TOOTH EXTRACTION Right 05/19/2021    OB History   No obstetric history on file.      Home Medications    Prior to Admission medications   Medication Sig Start Date End Date Taking? Authorizing Provider  azithromycin (ZITHROMAX) 250 MG tablet Take 1 tablet (250  mg total) by mouth daily. Take first 2 tablets together, then 1 every day until finished. 10/31/22  Yes Eliazer Hemphill-Warren, Alda Lea, NP  benzonatate (TESSALON) 100 MG capsule Take 1 capsule (100 mg total) by mouth every 8 (eight) hours. 10/31/22  Yes Cyleigh Massaro-Warren, Alda Lea, NP  amLODipine-valsartan (EXFORGE) 5-320 MG tablet Take 0.5 tablets by mouth daily.  02/03/20   [provider]  metFORMIN (GLUCOPHAGE-XR) 500 MG 24 hr tablet Take 500 mg by mouth every morning. 03/21/20   [provider]  rosuvastatin (CRESTOR) 10 MG tablet Take 10 mg by mouth at bedtime.     [provider]     Family History Family History  Problem Relation Age of Onset   Cancer Paternal Grandmother    Anesthesia problems Neg Hx    Malignant hyperthermia Neg Hx    Hypotension Neg Hx    Pseudochol deficiency Neg Hx     Social History Social History   Tobacco Use   Smoking status: Never   Smokeless tobacco: Never  Vaping Use   Vaping Use: Never used  Substance Use Topics   Alcohol use: No   Drug use: No     Allergies   Patient has no known allergies.   Review of Systems Review of Systems Per HPI  Physical Exam Triage Vital Signs ED Triage Vitals  Enc Vitals Group     BP 10/31/22 0822 126/72     Pulse Rate 10/31/22 0822 94     Resp 10/31/22 0822 18     Temp 10/31/22 0822 99.7 F (37.6 C)     Temp Source 10/31/22 0822 Oral     SpO2 10/31/22 0822 97 %     Weight --      Height --      Head Circumference --      Peak Flow --      Pain Score 10/31/22 0821 8     Pain Loc --      Pain Edu? --      Excl. in [place]? --    No data found.  Updated Vital Signs BP 126/72 (BP Location: Right Arm)   Pulse 94   Temp 99.7 F (37.6 C) (Oral)   Resp 18   SpO2 97%   Visual Acuity Right Eye Distance:   Left Eye Distance:   Bilateral Distance:    Right Eye Near:   Left Eye Near:    Bilateral Near:     Physical Exam Vitals and nursing note reviewed.  Constitutional:      General: She is not in acute distress. HENT:     Head: Normocephalic.     Right Ear: Tympanic membrane, ear canal and external ear normal.     Left Ear: Tympanic membrane, ear canal and external ear normal.     Nose: Nose normal.     Mouth/Throat:     Mouth: Mucous membranes are moist.     Pharynx: Posterior oropharyngeal erythema present.  Eyes:     Extraocular Movements: Extraocular movements intact.     Conjunctiva/sclera: Conjunctivae normal.     Pupils: Pupils are equal, round, and reactive to light.  Cardiovascular:     Rate and Rhythm: Normal rate and regular rhythm.     Pulses:  Normal pulses.     Heart sounds: Normal heart sounds.  Pulmonary:     Effort: Pulmonary effort is normal. No respiratory distress.     Breath sounds: Normal breath sounds. No stridor. No wheezing, rhonchi or  rales.  Abdominal:     General: Bowel sounds are normal.     Palpations: Abdomen is soft.     Tenderness: There is no abdominal tenderness.  Musculoskeletal:     Cervical back: Normal range of motion.  Skin:    General: Skin is warm and dry.  Neurological:     General: No focal deficit present.     Mental Status: She is alert and oriented to person, place, and time.  Psychiatric:        Mood and Affect: Mood normal.        Behavior: Behavior normal.      UC Treatments / Results  Labs (all labs ordered are listed, but only abnormal results are displayed) Labs Reviewed  POCT FASTING CBG [facility] MANUAL ENTRY - Abnormal; Notable for the following components:      Result Value   POCT Glucose ([facility]) 155 (*)    All other components within normal limits  POCT INFLUENZA A/B    EKG   Radiology DG Chest 2 View  Result Date: 10/31/2022 CLINICAL DATA:  77 year old female with cough, fever, shortness of breath for 3 days. EXAM: CHEST - 2 VIEW COMPARISON:  Prior chest radiographs 10/05/2012 and earlier. FINDINGS: Mildly lower lung volumes on the PA view. Mediastinal contours are stable and within normal limits. Visualized tracheal air column is within normal limits. No convincing acute or confluent pulmonary opacity. No pneumothorax or pleural effusion. No acute osseous abnormality identified. Negative visible bowel gas. IMPRESSION: Lower lung volumes.  No acute cardiopulmonary abnormality. Electronically Signed   By: Genevie Ann M.D.   On: 10/31/2022 08:51    Procedures Procedures (including critical care time)  Medications Ordered in UC Medications - No data to display  Initial Impression / Assessment and Plan / UC Course  I have reviewed the triage vital signs and the nursing  notes.  Pertinent labs & imaging results that were available during my care of the patient were reviewed by me and considered in my medical decision making (see chart for details).  The patient is well-appearing, she is in no acute distress, vital signs are stable.  Influenza test was negative, fingerstick blood glucose was 155, and chest x-ray did not show any acute cardiopulmonary abnormality such as pneumonia.  The patient's previous lab work, did not see any recent hemoglobin A1c or kidney function test.  Difficult to ascertain the cause of the patient's symptoms, most likely symptoms are viral at this time.  Given her age, ration of the patient's symptoms, and new complaints of chest tightness and shortness of breath, will treat empirically with azithromycin 250 mg for the next 5 days.  Patient was given Ladona Ridgel to help with her cough.  Supportive care recommendations were provided to the patient.  Patient was advised to follow-up with her primary care physician within the next 5 to 7 days if symptoms do not improve, patient was given strict ER follow-up precautions.  Patient is in agreement with this plan of care and verbalizes understanding.  All questions were answered.  Patient stable for discharge.  Final Clinical Impressions(s) / UC Diagnoses   Final diagnoses:  Acute upper respiratory infection  History of diabetes mellitus     Discharge Instructions      The influenza test was negative, and the chest x-ray did not show any signs of pneumonia or other abnormalities. I suspect symptoms are viral, but I am going to cover you with an antibiotic to help with your  cough.  Take medication as prescribed, and complete the medication even if you begin feeling better. Increase fluids and allow for plenty of rest. Recommend continuing Tylenol as needed for pain, fever, or general discomfort. Make sure you are drinking at least 8-10 8 ounce glasses of water while symptoms persist. If  you develop worsening symptoms such as new fever, chills, become more short of breath, or have difficulty breathing, please follow-up in the emergency department for further evaluation. Please follow-up with your primary care physician within the next 5 to 7 days for reevaluation if symptoms fail to improve. Follow-up as needed.     ED Prescriptions     Medication Sig Dispense Auth. Provider   azithromycin (ZITHROMAX) 250 MG tablet Take 1 tablet (250 mg total) by mouth daily. Take first 2 tablets together, then 1 every day until finished. 6 tablet Aniello Christopoulos-Warren, Alda Lea, NP   benzonatate (TESSALON) 100 MG capsule Take 1 capsule (100 mg total) by mouth every 8 (eight) hours. 30 capsule Jalise Zawistowski-Warren, Alda Lea, NP      PDMP not reviewed this encounter.   Tish Men, NP 10/31/22 [phone]

## 2022-10-31 NOTE — ED Triage Notes (Signed)
Pt reports nasal congestion, chest congestion, sore throat, headache, body aches x 2-3 days. OTC meds gives no relief.

## 2022-10-31 NOTE — Discharge Instructions (Signed)
The influenza test was negative, and the chest x-ray did not show any signs of pneumonia or other abnormalities. I suspect symptoms are viral, but I am going to cover you with an antibiotic to help with your cough.  Take medication as prescribed, and complete the medication even if you begin feeling better. Increase fluids and allow for plenty of rest. Recommend continuing Tylenol as needed for pain, fever, or general discomfort. Make sure you are drinking at least 8-10 8 ounce glasses of water while symptoms persist. If you develop worsening symptoms such as new fever, chills, become more short of breath, or have difficulty breathing, please follow-up in the emergency department for further evaluation. Please follow-up with your primary care physician within the next 5 to 7 days for reevaluation if symptoms fail to improve. Follow-up as needed.

## 2023-02-14 DIAGNOSIS — E782 Mixed hyperlipidemia: Secondary | ICD-10-CM | POA: Diagnosis not present

## 2023-02-14 DIAGNOSIS — Z6826 Body mass index (BMI) 26.0-26.9, adult: Secondary | ICD-10-CM | POA: Diagnosis not present

## 2023-02-14 DIAGNOSIS — E1159 Type 2 diabetes mellitus with other circulatory complications: Secondary | ICD-10-CM | POA: Diagnosis not present

## 2023-02-14 DIAGNOSIS — H6120 Impacted cerumen, unspecified ear: Secondary | ICD-10-CM | POA: Diagnosis not present

## 2023-02-14 DIAGNOSIS — I10 Essential (primary) hypertension: Secondary | ICD-10-CM | POA: Diagnosis not present

## 2023-02-14 DIAGNOSIS — H6121 Impacted cerumen, right ear: Secondary | ICD-10-CM | POA: Diagnosis not present

## 2023-02-14 DIAGNOSIS — E663 Overweight: Secondary | ICD-10-CM | POA: Diagnosis not present

## 2023-02-16 ENCOUNTER — Emergency Department (HOSPITAL_COMMUNITY)
Admission: EM | Admit: 2023-02-16 | Discharge: 2023-02-16 | Disposition: A | Discharge status: Home / Self Care | Payer: Medicare Other | Attending: Emergency Medicine | Admitting: Emergency Medicine

## 2023-02-16 ENCOUNTER — Emergency Department (HOSPITAL_COMMUNITY): Payer: Medicare Other

## 2023-02-16 ENCOUNTER — Other Ambulatory Visit: Payer: Self-pay

## 2023-02-16 ENCOUNTER — Encounter: Payer: Self-pay | Admitting: Emergency Medicine

## 2023-02-16 ENCOUNTER — Ambulatory Visit
Admission: EM | Admit: 2023-02-16 | Discharge: 2023-02-16 | Disposition: A | Discharge status: Home / Self Care | Payer: Medicare Other | Attending: Family Medicine | Admitting: Family Medicine

## 2023-02-16 DIAGNOSIS — R519 Headache, unspecified: Secondary | ICD-10-CM

## 2023-02-16 DIAGNOSIS — I10 Essential (primary) hypertension: Secondary | ICD-10-CM | POA: Insufficient documentation

## 2023-02-16 DIAGNOSIS — E119 Type 2 diabetes mellitus without complications: Secondary | ICD-10-CM | POA: Diagnosis not present

## 2023-02-16 DIAGNOSIS — Z7984 Long term (current) use of oral hypoglycemic drugs: Secondary | ICD-10-CM | POA: Diagnosis not present

## 2023-02-16 DIAGNOSIS — Z79899 Other long term (current) drug therapy: Secondary | ICD-10-CM | POA: Diagnosis not present

## 2023-02-16 LAB — CBC WITH DIFFERENTIAL/PLATELET
Abs Immature Granulocytes: 0.01 10*3/uL (ref 0.00–0.07)
Basophils Absolute: 0 10*3/uL (ref 0.0–0.1)
Basophils Relative: 0 %
Eosinophils Absolute: 0.1 10*3/uL (ref 0.0–0.5)
Eosinophils Relative: 1 %
HCT: 35.9 % — ABNORMAL LOW (ref 36.0–46.0)
Hemoglobin: 10.9 g/dL — ABNORMAL LOW (ref 12.0–15.0)
Immature Granulocytes: 0 %
Lymphocytes Relative: 32 %
Lymphs Abs: 1.4 10*3/uL (ref 0.7–4.0)
MCH: 23 pg — ABNORMAL LOW (ref 26.0–34.0)
MCHC: 30.4 g/dL (ref 30.0–36.0)
MCV: 75.9 fL — ABNORMAL LOW (ref 80.0–100.0)
Monocytes Absolute: 0.3 10*3/uL (ref 0.1–1.0)
Monocytes Relative: 7 %
Neutro Abs: 2.7 10*3/uL (ref 1.7–7.7)
Neutrophils Relative %: 60 %
Platelets: 233 10*3/uL (ref 150–400)
RBC: 4.73 MIL/uL (ref 3.87–5.11)
RDW: 13.9 % (ref 11.5–15.5)
WBC: 4.5 10*3/uL (ref 4.0–10.5)
nRBC: 0 % (ref 0.0–0.2)

## 2023-02-16 LAB — BASIC METABOLIC PANEL
Anion gap: 6 (ref 5–15)
BUN: 14 mg/dL (ref 8–23)
CO2: 24 mmol/L (ref 22–32)
Calcium: 9.5 mg/dL (ref 8.9–10.3)
Chloride: 106 mmol/L (ref 98–111)
Creatinine, Ser: 0.77 mg/dL (ref 0.44–1.00)
GFR, Estimated: >60 mL/min (ref >60)
Glucose, Bld: 179 mg/dL — ABNORMAL HIGH (ref 70–99)
Potassium: 4.1 mmol/L (ref 3.5–5.1)
Sodium: 136 mmol/L (ref 135–145)

## 2023-02-16 MED ORDER — ACETAMINOPHEN 500 MG PO TABS
1000.0000 mg | ORAL_TABLET | Freq: Once | ORAL | Status: AC
  Administered 2023-02-16: 1000 mg via ORAL
  Filled 2023-02-16: qty 2

## 2023-02-16 MED ORDER — SODIUM CHLORIDE 0.9 % IV BOLUS
1000.0000 mL | Freq: Once | INTRAVENOUS | Status: AC
  Administered 2023-02-16: 1000 mL via INTRAVENOUS

## 2023-02-16 NOTE — ED Notes (Signed)
Patient is being discharged from the Urgent Care and sent to the Emergency Department via POV . Per MD, patient is in need of higher level of care due to intractable headache. Patient is aware and verbalizes understanding of plan of care.  Vitals:   02/16/23 0919  BP: (!) 148/73  Pulse: 60  Resp: 20  Temp: 97.6 F (36.4 C)  SpO2: 97%

## 2023-02-16 NOTE — ED Provider Notes (Signed)
[facility]   [id number] 02/16/23 Arrival Time: 0911  ASSESSMENT & PLAN:  1. Acute intractable headache, unspecified headache type    Normal neurological exam which is reassuring. Unclear etiology of sharp L-sided HA. Afebrile. No TTP or swelling at area of pain. Non-reproducible on exam. Very limited diagnostic resources here. She is not comfortable with conservative analgesics and observation. Prefers ED evaluation. D/C to ED via POV; stable.  Recommend:  Follow-up Information     Go to  [facility] Emergency Department at [facility].   Specialty: Emergency Medicine Contact information: [address] [id number] [address] [place] [address] [phone]               Reviewed expectations re: course of current medical issues. Questions answered. Outlined signs and symptoms indicating need for more acute intervention. Patient verbalized understanding. After Visit Summary given.   SUBJECTIVE: History from: Patient. Patient is able to give a clear and coherent history.  Ann Vasquez is a 77 y.o. female with h/o DM, hyperlipidemia, HTN, anemia who presents with complaint of a L-sided headache. Abrupt onset; yesterday; behind and around her ear. Intermittent. "Just hits me with a sharp pain". Lasts a second or two then eases; sometimes radiates to superior scalp. Denies neck pain, fever, confusion. Normal PO intake without n/v. Normal swallowing. Normal speech. Normal gait. No extremity sensation changes or weakness.   OBJECTIVE:  Vitals:   02/16/23 0919  BP: (!) 148/73  Pulse: 60  Resp: 20  Temp: 97.6 F (36.4 C)  TempSrc: Oral  SpO2: 97%    General appearance: alert; NAD but jerks her head frequently with reported L-sided head pain HENT: normocephalic; atraumatic Eyes: PERRLA; EOMI; conjunctivae normal Neck: supple with FROM Lungs: clear to auscultation bilaterally; unlabored respirations Heart: regular rate and  rhythm Extremities: no edema; symmetrical with no gross deformities Skin: warm and dry Neurologic: alert; speech is fluent and clear without dysarthria or aphasia; CN 2-12 grossly intact; no facial droop; normal gait; normal symmetric reflexes; normal extremity strength and sensation throughout; bilateral upper and lower extremity sensation is grossly intact with 5/5 symmetric strength; normal grip strength Psychological: alert and cooperative; normal mood and affect  No Known Allergies  Past Medical History:  Diagnosis Date   Anemia    Cellulitis and abscess of leg    extends to foot   Diabetes mellitus type 2, diet-controlled (HCC)    Frequent headaches    Heart murmur    High cholesterol    HTN (hypertension)    Hyperlipidemia    Social History   Socioeconomic History   Marital status: Widowed    Spouse name: Not on file   Number of children: Not on file   Years of education: 12th grade   Highest education level: Not on file  Occupational History   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    Employer: [employer]  Tobacco Use   Smoking status: Never   Smokeless tobacco: Never  Vaping Use   Vaping Use: Never used  Substance and Sexual Activity   Alcohol use: No   Drug use: No   Sexual activity: Never  Other Topics Concern   Not on file  Social History Narrative   Not on file   Social Determinants of Health   Financial Resource Strain: Not on file  Food Insecurity: Not on file  Transportation Needs: Not on file  Physical Activity: Not on file  Stress: Not on file  Social Connections: Not on  file  Intimate Partner Violence: Not on file   Family History  Problem Relation Age of Onset   Cancer Paternal Grandmother    Anesthesia problems Neg Hx    Malignant hyperthermia Neg Hx    Hypotension Neg Hx    Pseudochol deficiency Neg Hx    Past Surgical History:  Procedure Laterality Date   COLONOSCOPY WITH PROPOFOL N/A 05/27/2020   Procedure: COLONOSCOPY  WITH PROPOFOL;  Surgeon: Dolores Frame, MD;  Location: [facility];  Service: Gastroenterology;  Laterality: N/A;  815   EYE SURGERY Right    detached retina   POLYPECTOMY  05/27/2020   Procedure: POLYPECTOMY;  Surgeon: Dolores Frame, MD;  Location: [facility];  Service: Gastroenterology;;   right shoulder  2011   rotator cuff repair, Romeo Apple, [facility]   SHOULDER OPEN ROTATOR CUFF REPAIR  10/29/2011   Procedure: left ROTATOR CUFF REPAIR SHOULDER OPEN;  Surgeon: Fuller Canada, MD;  Location: [facility];  Service: Orthopedics;  Laterality: Left;   WISDOM TOOTH EXTRACTION Right 05/19/2021      Mardella Layman, MD 02/16/23 [phone]

## 2023-02-16 NOTE — Discharge Instructions (Addendum)
It's unclear what is causing your headache. Follow up with your primary care physician, and I have also given you the number of a Neurologist you can follow up with. Call their office to help set up an outpatient appointment.   If you develop continued, recurrent, or worsening headache, fever, neck stiffness, vomiting, blurry or double vision, weakness or numbness in your arms or legs, trouble speaking, or any other new/concerning symptoms then return to the ER for evaluation.

## 2023-02-16 NOTE — ED Provider Notes (Signed)
[place] EMERGENCY DEPARTMENT AT [facility] Provider Note   CSN: [id number] Arrival date & time: 02/16/23  6578     History  Chief Complaint  Patient presents with   Headache    Ann Vasquez is a 77 y.o. female.  HPI 77 year old female presents with a headache.  She has a history of hypertension, hyperlipidemia, diabetes.  Headache started around 2 PM yesterday while she was cooking.  Headache is just superior to her left ear and seems to happen suddenly, very sharply, then goes away.  It keeps coming and going.  It is the same intensity that it was when it first started.  No visual complaints, weakness, numbness.  She is concerned about a stroke.  Has tried Aleve twice without any relief.  She feels like both the ears have been clogged though her right ear was cleaned at her doctor's office a few days ago.  Home Medications Prior to Admission medications   Medication Sig Start Date End Date Taking? Authorizing Provider  amLODipine-valsartan (EXFORGE) 5-320 MG tablet Take 0.5 tablets by mouth daily.  02/03/20   [provider]  metFORMIN (GLUCOPHAGE-XR) 500 MG 24 hr tablet Take 500 mg by mouth every morning. 03/21/20   [provider]  rosuvastatin (CRESTOR) 10 MG tablet Take 10 mg by mouth at bedtime.     [provider]      Allergies    Patient has no known allergies.    Review of Systems   Review of Systems  Constitutional:  Negative for fever.  Eyes:  Negative for visual disturbance.  Gastrointestinal:  Negative for vomiting.  Musculoskeletal:  Negative for neck pain and neck stiffness.  Neurological:  Positive for headaches. Negative for weakness and numbness.    Physical Exam Updated Vital Signs BP (!) 170/79 (BP Location: Left Arm)   Pulse (!) 48   Temp 98 F (36.7 C) (Oral)   Resp 16   Ht 4\' 11"  (1.499 m)   Wt 59.4 kg   SpO2 98%   BMI 26.46 kg/m  Physical Exam Vitals and nursing note reviewed.   Constitutional:      Appearance: She is well-developed.  HENT:     Head: Normocephalic and atraumatic.     Comments: No scalp tenderness, particularly superior to left ear    Right Ear: Tympanic membrane normal.     Ears:     Comments: Left TM occluded by wax Eyes:     Extraocular Movements: Extraocular movements intact.     Pupils: Pupils are equal, round, and reactive to light.  Cardiovascular:     Rate and Rhythm: Normal rate and regular rhythm.     Heart sounds: Normal heart sounds.  Pulmonary:     Effort: Pulmonary effort is normal.     Breath sounds: Normal breath sounds.  Abdominal:     Palpations: Abdomen is soft.     Tenderness: There is no abdominal tenderness.  Musculoskeletal:     Cervical back: Normal range of motion. No rigidity.  Skin:    General: Skin is warm and dry.  Neurological:     Mental Status: She is alert.     Comments: CN 3-12 grossly intact. 5/5 strength in all 4 extremities. Grossly normal sensation. Normal finger to nose.      ED Results / Procedures / Treatments   Labs (all labs ordered are listed, but only abnormal results are displayed) Labs Reviewed  CBC WITH DIFFERENTIAL/PLATELET - Abnormal; Notable for the  following components:      Result Value   Hemoglobin 10.9 (*)    HCT 35.9 (*)    MCV 75.9 (*)    MCH 23.0 (*)    All other components within normal limits  BASIC METABOLIC PANEL - Abnormal; Notable for the following components:   Glucose, Bld 179 (*)    All other components within normal limits    EKG None  Radiology CT Head Wo Contrast  Result Date: 02/16/2023 CLINICAL DATA:  Headache, sudden, severe EXAM: CT HEAD WITHOUT CONTRAST TECHNIQUE: Contiguous axial images were obtained from the base of the skull through the vertex without intravenous contrast. RADIATION DOSE REDUCTION: This exam was performed according to the departmental dose-optimization program which includes automated exposure control, adjustment of the mA  and/or kV according to patient size and/or use of iterative reconstruction technique. COMPARISON:  CT head June 08, 2019. FINDINGS: Brain: Similar small focus of chronic microvascular ischemic change in the posterior right frontal white matter. No evidence of acute large vascular territory infarct, acute hemorrhage, mass lesion or midline shift. Partially empty sella. Vascular: No hyperdense vessel. Skull: No acute fracture. Sinuses/Orbits: Clear sinuses.  No acute orbital findings. Other: No mastoid effusions. IMPRESSION: 1. No evidence of acute intracranial abnormality. 2. Partially empty sella, which is often a normal anatomic variant but can be associated with idiopathic intracranial hypertension. Electronically Signed   By: Feliberto Harts M.D.   On: 02/16/2023 12:25    Procedures Procedures    Medications Ordered in ED Medications  sodium chloride 0.9 % bolus 1,000 mL (0 mLs Intravenous Stopped 02/16/23 1315)  acetaminophen (TYLENOL) tablet 1,000 mg (1,000 mg Oral Given 02/16/23 1107)    ED Course/ Medical Decision Making/ A&P                             Medical Decision Making Amount and/or Complexity of Data Reviewed Labs: ordered.    Details: Mild anemia, normal WBC. Radiology: ordered and independent interpretation performed.    Details: No head bleed  Risk OTC drugs.   Patient presents with on and off left-sided headache.  Sounds like trigeminal neuralgia except in the odd location for this.  Has a partially empty sella but I do not think her presentation today is consistent with idiopathic intracranial hypertension.  She feels a lot better with Tylenol and the attacks have, a lot less.  Given this, I think trigeminal neuralgia is less likely and I do not know that I would put her on carbamazepine at this point.  With no infectious symptoms, and very low suspicion for idiopathic intracranial hypertension I do not think LP is needed.  At this point she is feeling better and I  think it is reasonable to discharge her home to follow-up with neurology as needed or if not improving.        Final Clinical Impression(s) / ED Diagnoses Final diagnoses:  Left-sided headache    Rx / DC Orders ED Discharge Orders     None         Pricilla Loveless, MD 02/16/23 [phone]

## 2023-02-16 NOTE — ED Triage Notes (Signed)
Pt arrived POV with c/o left sided sharp intermediate pains rigth above her ear. No vision, hearing, speech, or dizziness noted. Pt has reported left ear stopped up for a few days. No n/v , or gait issues noted.

## 2023-02-16 NOTE — ED Triage Notes (Addendum)
Pt reports intermittent left sided "sharp" head pain since yesterday around 2pm. Pt denies any light/sound sensitivity, known injury, facial or extremity numbness. Speech clear. Gait steady.   Denies any pain at time of triage.

## 2023-02-18 DIAGNOSIS — Z6827 Body mass index (BMI) 27.0-27.9, adult: Secondary | ICD-10-CM | POA: Diagnosis not present

## 2023-02-18 DIAGNOSIS — E663 Overweight: Secondary | ICD-10-CM | POA: Diagnosis not present

## 2023-02-18 DIAGNOSIS — M5481 Occipital neuralgia: Secondary | ICD-10-CM | POA: Diagnosis not present

## 2023-02-18 DIAGNOSIS — E1159 Type 2 diabetes mellitus with other circulatory complications: Secondary | ICD-10-CM | POA: Diagnosis not present

## 2023-02-24 ENCOUNTER — Other Ambulatory Visit (HOSPITAL_COMMUNITY): Payer: Self-pay | Admitting: Family Medicine

## 2023-02-24 DIAGNOSIS — N6489 Other specified disorders of breast: Secondary | ICD-10-CM

## 2023-03-08 ENCOUNTER — Ambulatory Visit (HOSPITAL_COMMUNITY)
Admission: RE | Admit: 2023-03-08 | Discharge: 2023-03-08 | Disposition: A | Discharge status: Home / Self Care | Payer: Medicare Other | Source: Ambulatory Visit | Attending: Family Medicine | Admitting: Family Medicine

## 2023-03-08 DIAGNOSIS — R928 Other abnormal and inconclusive findings on diagnostic imaging of breast: Secondary | ICD-10-CM | POA: Diagnosis not present

## 2023-03-08 DIAGNOSIS — N6489 Other specified disorders of breast: Secondary | ICD-10-CM

## 2023-03-08 DIAGNOSIS — R92322 Mammographic fibroglandular density, left breast: Secondary | ICD-10-CM | POA: Diagnosis not present

## 2023-05-04 ENCOUNTER — Other Ambulatory Visit: Payer: Self-pay

## 2023-05-04 ENCOUNTER — Emergency Department (HOSPITAL_COMMUNITY)
Admission: EM | Admit: 2023-05-04 | Discharge: 2023-05-04 | Disposition: A | Discharge status: Home / Self Care | Payer: Medicare Other | Attending: Emergency Medicine | Admitting: Emergency Medicine

## 2023-05-04 ENCOUNTER — Emergency Department (HOSPITAL_COMMUNITY): Payer: Medicare Other

## 2023-05-04 ENCOUNTER — Encounter (HOSPITAL_COMMUNITY): Payer: Self-pay

## 2023-05-04 DIAGNOSIS — M5441 Lumbago with sciatica, right side: Secondary | ICD-10-CM | POA: Insufficient documentation

## 2023-05-04 DIAGNOSIS — Z79899 Other long term (current) drug therapy: Secondary | ICD-10-CM | POA: Diagnosis not present

## 2023-05-04 DIAGNOSIS — M4316 Spondylolisthesis, lumbar region: Secondary | ICD-10-CM | POA: Diagnosis not present

## 2023-05-04 DIAGNOSIS — M25551 Pain in right hip: Secondary | ICD-10-CM | POA: Diagnosis not present

## 2023-05-04 DIAGNOSIS — M545 Low back pain, unspecified: Secondary | ICD-10-CM | POA: Diagnosis not present

## 2023-05-04 DIAGNOSIS — E119 Type 2 diabetes mellitus without complications: Secondary | ICD-10-CM | POA: Insufficient documentation

## 2023-05-04 DIAGNOSIS — M438X6 Other specified deforming dorsopathies, lumbar region: Secondary | ICD-10-CM | POA: Diagnosis not present

## 2023-05-04 DIAGNOSIS — Z7984 Long term (current) use of oral hypoglycemic drugs: Secondary | ICD-10-CM | POA: Diagnosis not present

## 2023-05-04 DIAGNOSIS — I10 Essential (primary) hypertension: Secondary | ICD-10-CM | POA: Diagnosis not present

## 2023-05-04 DIAGNOSIS — M5431 Sciatica, right side: Secondary | ICD-10-CM

## 2023-05-04 DIAGNOSIS — M47816 Spondylosis without myelopathy or radiculopathy, lumbar region: Secondary | ICD-10-CM | POA: Diagnosis not present

## 2023-05-04 DIAGNOSIS — M8580 Other specified disorders of bone density and structure, unspecified site: Secondary | ICD-10-CM | POA: Diagnosis not present

## 2023-05-04 MED ORDER — IBUPROFEN 800 MG PO TABS: 800.0000 mg | ORAL_TABLET | Freq: Three times a day (TID) | ORAL | 0 refills | Status: DC

## 2023-05-04 NOTE — ED Triage Notes (Addendum)
C/o right lower back pain radiating into right hip and leg x1 day that started when getting up off couch.  Denies fall/trauma

## 2023-05-04 NOTE — ED Provider Notes (Signed)
[place] EMERGENCY DEPARTMENT AT [facility] Provider Note   CSN: [id number] Arrival date & time: 05/04/23  1508     History  Chief Complaint  Patient presents with   Hip Pain    Ann Vasquez is a 77 y.o. female.   Hip Pain Pertinent negatives include no chest pain, no abdominal pain and no shortness of breath.       Ann Vasquez is a 77 y.o. female with past medical history of hypertension, type 2 diabetes, who presents to the Emergency Department complaining of right hip and low back pain.  Symptoms began yesterday gradually worsening today after waking up.  She felt pain yesterday after getting up from the sofa.  Denies fall or known injury.  Pain is improved at rest worsens with standing or walking.  Took Tylenol earlier today without relief.  States she has been unable to bear weight to her right leg today due to pain.  Pain radiates from right hip into upper right thigh.  No numbness or weakness of her legs.  She denies abdominal pain, urine or bowel changes, nausea vomiting, fever or chills.  No prior spinal procedures.   Home Medications Prior to Admission medications   Medication Sig Start Date End Date Taking? Authorizing Provider  amLODipine-valsartan (EXFORGE) 5-320 MG tablet Take 0.5 tablets by mouth daily.  02/03/20   [provider]  metFORMIN (GLUCOPHAGE-XR) 500 MG 24 hr tablet Take 500 mg by mouth every morning. 03/21/20   [provider]  rosuvastatin (CRESTOR) 10 MG tablet Take 10 mg by mouth at bedtime.     [provider]      Allergies    Patient has no known allergies.    Review of Systems   Review of Systems  Constitutional:  Negative for chills and fever.  Respiratory:  Negative for shortness of breath.   Cardiovascular:  Negative for chest pain.  Gastrointestinal:  Negative for abdominal pain, constipation, diarrhea, nausea and vomiting.  Genitourinary:  Negative for dysuria.  Musculoskeletal:   Positive for arthralgias (right hip pain) and back pain. Negative for neck pain and neck stiffness.  Neurological:  Negative for weakness and numbness.    Physical Exam Updated Vital Signs BP (!) 176/59 (BP Location: Right Arm)   Pulse (!) 58   Temp 98 F (36.7 C)   Resp 16   Wt 59 kg   SpO2 100%   BMI 26.27 kg/m  Physical Exam Vitals and nursing note reviewed.  Constitutional:      General: She is not in acute distress.    Appearance: Normal appearance. She is not ill-appearing or toxic-appearing.  Cardiovascular:     Rate and Rhythm: Normal rate and regular rhythm.     Pulses: Normal pulses.  Pulmonary:     Effort: Pulmonary effort is normal.  Abdominal:     General: There is no distension.     Palpations: Abdomen is soft.     Tenderness: There is no abdominal tenderness.  Musculoskeletal:        General: Tenderness present.     Comments: Focal tenderness to palpation right lower SI joint space  Patient able to perform full range of motion of the right hip without difficulty.  Negative bilateral straight leg raise  Skin:    General: Skin is warm.     Capillary Refill: Capillary refill takes less than 2 seconds.     Findings: No rash.  Neurological:     General: No  focal deficit present.     Mental Status: She is alert.     Sensory: No sensory deficit.     Motor: No weakness.     ED Results / Procedures / Treatments   Labs (all labs ordered are listed, but only abnormal results are displayed) Labs Reviewed - No data to display  EKG None  Radiology DG Lumbar Spine Complete  Result Date: 05/04/2023 CLINICAL DATA:  pain. Right lower back pain radiating to right hip and leg. EXAM: LUMBAR SPINE - COMPLETE 4+ VIEW COMPARISON:  06/18/2020. FINDINGS: There are 5 nonrib-bearing lumbar vertebrae. Anatomic lumbar curvature. No spondylolysis. There is grade 1 anterolisthesis of L4 over L5, similar to the prior study. Vertebral body heights are maintained. No aggressive  osseous lesion. Mild-moderate multilevel degenerative changes in the form of reduced intervertebral disc height, facet arthropathy and marginal osteophyte formation. Sacroiliac joints are symmetric. Visualized soft tissues are within normal limits. IMPRESSION: No acute fracture or traumatic malalignment. Mild-moderate degenerative changes. Electronically Signed   By: Jules Schick M.D.   On: 05/04/2023 17:28   DG Hip Unilat W or Wo Pelvis 2-3 Views Right  Result Date: 05/04/2023 CLINICAL DATA:  Pain EXAM: DG HIP (WITH OR WITHOUT PELVIS) 3V RIGHT COMPARISON:  None Available. FINDINGS: No fracture or dislocation. Preserved joint spaces. Osteopenia. There is sclerosis along the pubic symphysis. Presumed vascular calcifications in the central pelvis. Hyperostosis IMPRESSION: Osteopenia with chronic changes. Electronically Signed   By: Karen Kays M.D.   On: 05/04/2023 17:26    Procedures Procedures    Medications Ordered in ED Medications - No data to display  ED Course/ Medical Decision Making/ A&P                                 Medical Decision Making Patient here with right-sided low back, right hip pain x 1 day.  Pain began after getting up off the sofa.  Denies known injury or fall.  Pain radiating into her buttock and right upper thigh area.  No numbness or weakness of the extremities, no abdominal pain urine or bowel changes.  No saddle anesthesias on exam.  Suspect this is sciatica, cauda equina, hip pain, degenerative changes, AVN impingement syndrome all considered.  Cauda equina felt less likely.  Amount and/or Complexity of Data Reviewed Radiology: ordered.    Details: X-ray of the lumbar spine shows deg changes, no acute findings.   X-ray of the right hip shows osteopenia Discussion of management or test interpretation with external provider(s): Patient offered pain medication here but declined. Prefers NSAID and I have recommended OTC lidocaine patches.    Reviewed medical  records had reassuring kidney function in June of this year.    Will f/u with PCP for recheck.  Doubt emergent process.  No red flags suggestive of cauda equina.    Risk Prescription drug management.           Final Clinical Impression(s) / ED Diagnoses Final diagnoses:  Sciatica of right side    Rx / DC Orders ED Discharge Orders     None         Pauline Aus, PA-C 05/05/23 1200    Terrilee Files, MD 05/06/23 [phone]

## 2023-05-04 NOTE — Discharge Instructions (Signed)
As discussed, you may try 800 mg ibuprofen for 4 to 5 days.  Do not take this on an empty stomach.  Drink plenty of water.  Also apply over-the-counter 4% lidocaine patches to the affected area, on for 10 to 12 hours then off for 10-12.  Follow-up with your primary care provider for recheck if not improving.

## 2023-06-01 DIAGNOSIS — I10 Essential (primary) hypertension: Secondary | ICD-10-CM | POA: Diagnosis not present

## 2023-06-01 DIAGNOSIS — E7849 Other hyperlipidemia: Secondary | ICD-10-CM | POA: Diagnosis not present

## 2023-06-01 DIAGNOSIS — E1159 Type 2 diabetes mellitus with other circulatory complications: Secondary | ICD-10-CM | POA: Diagnosis not present

## 2023-06-01 LAB — COMPREHENSIVE METABOLIC PANEL
Calcium: 10.4 (ref 8.7–10.7)
eGFR: 67

## 2023-06-01 LAB — BASIC METABOLIC PANEL
BUN: 9 (ref 4–21)
Creatinine: 0.9 (ref 0.5–1.1)

## 2023-06-30 DIAGNOSIS — E663 Overweight: Secondary | ICD-10-CM | POA: Diagnosis not present

## 2023-06-30 DIAGNOSIS — I10 Essential (primary) hypertension: Secondary | ICD-10-CM | POA: Diagnosis not present

## 2023-06-30 DIAGNOSIS — Z6827 Body mass index (BMI) 27.0-27.9, adult: Secondary | ICD-10-CM | POA: Diagnosis not present

## 2023-06-30 DIAGNOSIS — Z0001 Encounter for general adult medical examination with abnormal findings: Secondary | ICD-10-CM | POA: Diagnosis not present

## 2023-06-30 DIAGNOSIS — Z1331 Encounter for screening for depression: Secondary | ICD-10-CM | POA: Diagnosis not present

## 2023-06-30 LAB — BASIC METABOLIC PANEL
BUN: 17 (ref 4–21)
Creatinine: 0.9 (ref 0.5–1.1)

## 2023-06-30 LAB — COMPREHENSIVE METABOLIC PANEL
Calcium: 10.8 — AB (ref 8.7–10.7)
eGFR: 68

## 2023-07-19 LAB — BASIC METABOLIC PANEL
BUN: 19 (ref 4–21)
Creatinine: 0.9 (ref 0.5–1.1)

## 2023-07-19 LAB — COMPREHENSIVE METABOLIC PANEL
Calcium: 10.4 (ref 8.7–10.7)
eGFR: 68

## 2023-10-10 ENCOUNTER — Other Ambulatory Visit (HOSPITAL_COMMUNITY): Payer: Self-pay | Admitting: Family Medicine

## 2023-10-10 DIAGNOSIS — N6489 Other specified disorders of breast: Secondary | ICD-10-CM

## 2023-11-15 NOTE — Patient Instructions (Signed)
 Hypercalcemia Hypercalcemia is when the level of calcium in a person's blood is above normal. The body needs calcium to make bones and keep them strong. Calcium also helps the muscles, nerves, brain, and heart work the way they should. Most of the calcium in the body is stored in the bones. There is also calcium in the blood. Hypercalcemia occurs when there is too much calcium in your blood. Calcium levels in the blood are regulated by hormones, kidneys, and the gastrointestinal tract.  Hypercalcemia can happen when calcium comes out of the bones, or when the kidneys are not able to remove calcium from the blood. Hypercalcemia can be mild or severe. What are the causes? There are many possible causes of hypercalcemia. Common causes of this condition include: Hyperparathyroidism. This is a condition in which the body produces too much parathyroid hormone. There are four parathyroid glands in your neck. These glands produce a chemical messenger (hormone) that helps the body absorb calcium from foods and helps your bones release calcium. Certain kinds of cancer. Less common causes of hypercalcemia include: Calcium and vitamin D dietary supplements. Chronic kidney disease. Hyperthyroidism. Severe dehydration. Being on bed rest or being inactive for a long time. Certain medicines. Infections. What increases the risk? You are more likely to develop this condition if: You are female. You are 76 years of age or older. You have a family history of hypercalcemia. What are the signs or symptoms? Mild hypercalcemia that starts slowly may not cause symptoms. Severe, sudden hypercalcemia is more likely to cause symptoms, such as: Being more thirsty than usual. Needing to urinate more often than usual. Abdominal pain. Nausea and vomiting. Constipation. Muscle pain, twitching, or weakness. Feeling very tired. How is this diagnosed?  Hypercalcemia is usually diagnosed with a blood test. You may also  have tests to help check what is causing this condition. Tests include imaging tests and more blood tests. How is this treated? Treatment for hypercalcemia depends on the cause. Treatment may include: Receiving fluids through an IV. Medicines. These can be used to: Keep calcium levels steady after receiving fluids (loop diuretics). Keep calcium in your bones (bisphosphonates). Lower the calcium level in your blood. Surgery to remove overactive parathyroid glands. A procedure that filters your blood to correct calcium levels (hemodialysis). Follow these instructions at home:  Take over-the-counter and prescription medicines only as told by your health care provider. Follow instructions from your health care provider about eating or drinking restrictions. Drink enough fluid to keep your urine pale yellow. Stay active. Weight-bearing exercise helps to keep calcium in your bones. Follow instructions from your health care provider about what type and level of exercise is safe for you. Keep all follow-up visits. This is important. Contact a health care provider if: You have a fever. Your heartbeat is irregular or very fast. You have changes in mood, memory, or personality. Get help right away if: You have severe abdominal pain. You have chest pain. You have trouble breathing. You become very confused and sleepy. You lose consciousness. These symptoms may represent a serious problem that is an emergency. Do not wait to see if the symptoms will go away. Get medical help right away. Call your local emergency services (911 in the [place] Do not drive yourself to the hospital. Summary Hypercalcemia is when the level of calcium in a person's blood is above normal. The body needs calcium to make bones and keep them strong. There are many possible causes of hypercalcemia, and treatment depends on  the cause. Take over-the-counter and prescription medicines only as told by your health care  provider. This information is not intended to replace advice given to you by your health care provider. Make sure you discuss any questions you have with your health care provider. Document Revised: 01/21/2021 Document Reviewed: 01/21/2021 Elsevier Patient Education  2024 [employer].

## 2023-11-16 ENCOUNTER — Ambulatory Visit: Payer: Medicare PPO | Admitting: Nurse Practitioner

## 2023-11-16 ENCOUNTER — Encounter: Payer: Self-pay | Admitting: Nurse Practitioner

## 2023-11-16 MED ORDER — VALSARTAN 320 MG PO TABS: 320.0000 mg | ORAL_TABLET | Freq: Every day | ORAL | 1 refills | Status: DC

## 2023-11-16 NOTE — Progress Notes (Signed)
 Endocrinology Consult Note       11/16/2023, 1:34 PM   SUBJECTIVE: Ann Vasquez is a 78 y.o.-year-old female, referred by her  Avis Epley, PA-C  , for evaluation for hypercalcemia/hyperparathyroidism.   Past Medical History:  Diagnosis Date   Anemia    Cellulitis and abscess of leg    extends to foot   Diabetes mellitus type 2, diet-controlled (HCC)    Frequent headaches    Heart murmur    High cholesterol    HTN (hypertension)    Hyperlipidemia     Past Surgical History:  Procedure Laterality Date   COLONOSCOPY WITH PROPOFOL N/A 05/27/2020   Procedure: COLONOSCOPY WITH PROPOFOL;  Surgeon: Dolores Frame, MD;  [facility];  Service: Gastroenterology;  Laterality: N/A;  815   EYE SURGERY Right    detached retina   POLYPECTOMY  05/27/2020   Procedure: POLYPECTOMY;  Surgeon: Dolores Frame, MD;  Location: [facility];  Service: Gastroenterology;;   right shoulder  2011   rotator cuff repair, Romeo Apple, [facility]   SHOULDER OPEN ROTATOR CUFF REPAIR  10/29/2011   Procedure: left ROTATOR CUFF REPAIR SHOULDER OPEN;  Surgeon: Fuller Canada, MD;  Location: [facility];  Service: Orthopedics;  Laterality: Left;   WISDOM TOOTH EXTRACTION Right 05/19/2021    Social History   Tobacco Use   Smoking status: Never   Smokeless tobacco: Never  Vaping Use   Vaping status: Never Used  Substance Use Topics   Alcohol use: No   Drug use: No    Family History  Problem Relation Age of Onset   Cancer Paternal Grandmother    Anesthesia problems Neg Hx    Malignant hyperthermia Neg Hx    Hypotension Neg Hx    Pseudochol deficiency Neg Hx     Outpatient Encounter Medications as of 11/16/2023  Medication Sig   metFORMIN (GLUCOPHAGE-XR) 500 MG 24 hr tablet Take 500 mg by mouth every morning.   Multiple Vitamins-Minerals (MULTIVITAMIN WOMEN 50+ PO) Take by mouth. [address] - Alive Gummy  - Patient takes 2 a day   rosuvastatin (CRESTOR) 10 MG tablet Take 10 mg by mouth at bedtime.    valsartan (DIOVAN) 320 MG tablet Take 1 tablet (320 mg total) by mouth daily.   [DISCONTINUED] valsartan-hydrochlorothiazide (DIOVAN-HCT) 320-12.5 MG tablet Take 1 tablet by mouth daily.   [DISCONTINUED] amLODipine-valsartan (EXFORGE) 5-320 MG tablet Take 0.5 tablets by mouth daily.  (Patient not taking: Reported on 11/16/2023)   [DISCONTINUED] ibuprofen (ADVIL) 800 MG tablet Take 1 tablet (800 mg total) by mouth 3 (three) times daily. Take with food (Patient not taking: Reported on 11/16/2023)   No facility-administered encounter medications on file as of 11/16/2023.    No Known Allergies   HPI  Ann Vasquez had first occurrence of hypercalcemia in 2023.  Patient has no previously known history of parathyroid, pituitary, adrenal dysfunctions; no family history of such dysfunctions. -Review of her referral package of most recent labs reveals calcium of 10.4, the corresponding PTH of 68 on 07/19/23.  I reviewed pt's DEXA scans: last Dexa scan from 2016 shows osteoporosis  No prior history of  fragility fractures or falls. No history of kidney stones.  No history of CKD. Last BUN/Cr: 19/0.88 on 07/19/23  she IS on HCTZ  therapy- with her BP combo pill, started a few years ago.  No history of vitamin D deficiency. No recent vitamin D level checked.  she does take a multivitamin with calcium in it.   she eats dairy and green, leafy, vegetables on average amounts.  she does not have a family history of hypercalcemia, pituitary tumors, or thyroid cancer,.   I reviewed her chart and she also has a history of HLD, iron deficiency anemia.    Review of systems  Constitutional: + Minimally fluctuating body weight,  current Body mass index is 26.78 kg/m. , no fatigue, no subjective hyperthermia, no subjective hypothermia Eyes: no blurry vision, no xerophthalmia ENT: no sore throat, no nodules  palpated in throat, no dysphagia/odynophagia, no hoarseness Cardiovascular: no chest pain, no shortness of breath, no palpitations, no leg swelling Respiratory: no cough, no shortness of breath Gastrointestinal: no nausea/vomiting/diarrhea Musculoskeletal: no muscle/joint aches Skin: no rashes, no hyperemia Neurological: no tremors, no numbness, no tingling, no dizziness Psychiatric: no depression, no anxiety -------------------------------------------------------------------------------------------------------------------------------------  OBJECTIVE: BP 130/68 (BP Location: Left Arm, Patient Position: Sitting, Cuff Size: Large)   Pulse 64   Ht 4\' 11"  (1.499 m)   Wt 132 lb 9.6 oz (60.1 kg)   BMI 26.78 kg/m , Body mass index is 26.78 kg/m. Wt Readings from Last 3 Encounters:  11/16/23 132 lb 9.6 oz (60.1 kg)  05/04/23 130 lb 1.1 oz (59 kg)  02/16/23 131 lb (59.4 kg)    BP Readings from Last 3 Encounters:  11/16/23 130/68  05/04/23 (!) 175/62  02/16/23 (!) 170/79     Physical Exam- Limited  Constitutional:  Body mass index is 26.78 kg/m. , not in acute distress, normal state of mind Eyes:  EOMI, no exophthalmos Neck: Supple Cardiovascular: RRR, no murmurs, rubs, or gallops, no edema Respiratory: Adequate breathing efforts, no crackles, rales, rhonchi, or wheezing Musculoskeletal: no gross deformities, strength intact in all four extremities, no gross restriction of joint movements Skin:  no rashes, no hyperemia Neurological: no tremor with outstretched hands   CMP ( most recent) CMP     Component Value Date/Time   NA 136 02/16/2023 1052   K 4.1 02/16/2023 1052   CL 106 02/16/2023 1052   CO2 24 02/16/2023 1052   GLUCOSE 179 (H) 02/16/2023 1052   BUN 19 07/19/2023 0000   CREATININE 0.9 07/19/2023 0000   CREATININE 0.77 02/16/2023 1052   CALCIUM 10.4 07/19/2023 0000   PROT 7.6 06/08/2019 1137   ALBUMIN 4.0 06/08/2019 1137   AST 21 06/08/2019 1137   ALT 16  06/08/2019 1137   ALKPHOS 70 06/08/2019 1137   BILITOT 0.4 06/08/2019 1137   EGFR 68 07/19/2023 0000   GFRNONAA >60 02/16/2023 1052     Diabetic Labs (most recent): No results found for: "HGBA1C", "MICROALBUR"   Lipid Panel ( most recent) Lipid Panel  No results found for: "CHOL", "TRIG", "HDL", "CHOLHDL", "VLDL", "LDLCALC", "LDLDIRECT", "LABVLDL"    No results found for: "TSH", "FREET4"    Assessment/Plan: 1. Hypercalcemia / Hyperparathyroidism  Patient has had several instances of elevated calcium, with the highest level being at 10.8 mg/dL. A corresponding intact PTH level was also mildly elevated, at 68.  - She has no known history of vitamin D deficiency, but I cannot see if it has been checked previously.  - She does have osteoporosis but  no other apparent complications from hypercalcemia/hyperparathyroidism: no history of nephrolithiasis, fragility fractures. No abdominal pain, no major mood disorders, no bone pain.  - I discussed with the patient about the physiology of calcium and parathyroid hormone, and possible  effects of  increased PTH/ Calcium , including kidney stones, cardiac dysrhythmias, osteoporosis, abdominal pain, etc.   - The work up so far is not sufficient to reach a conclusion for definitive therapy.  she  needs more studies to confirm and classify the parathyroid dysfunction she may have. I will proceed to obtain  repeat intact PTH/calcium, PTH-rp, Vitamin D, serum magnesium, serum phosphorus.  It is also essential to obtain 24-hour urine calcium/creatinine to rule out the rare but important cause of mild elevation in calcium and PTH- FHH ( Familial Hypocalciuric Hypercalcemia), which may not require any active intervention.    - Time spent with the patient: 52 minutes, of which >50% was spent in obtaining information about her symptoms, reviewing her previous labs, evaluations, and treatments, counseling her about her  hypercalcemia , and developing a  plan to confirm the diagnosis and long term treatment as necessary.  Please refer to " Patient Self Inventory" in the Media  tab for reviewed elements of pertinent patient history.  Ann Vasquez participated in the discussions, expressed understanding, and voiced agreement with the above plans.  All questions were answered to her satisfaction. she is encouraged to contact clinic should she have any questions or concerns prior to her return visit.   Follow Up Plan: - Return in about 4 weeks (around 12/14/2023) for hypecalcemia follow up with labs and 24 hr urine.Ronny Bacon, FNP-BC [facility] [address] [place], [place] [address] Phone: [phone] Fax: [phone]  11/16/2023, 1:34 PM

## 2023-11-24 LAB — CREATININE, URINE, 24 HOUR
Creatinine, 24H Ur: 1021 mg/(24.h) (ref 800–1800)
Creatinine, Urine: 85.1 mg/dL

## 2023-11-24 LAB — CALCIUM, URINE, 24 HOUR
Calcium, 24H Urine: 306 mg/(24.h) (ref 0–320)
Calcium, Urine: 25.5 mg/dL

## 2023-12-02 LAB — COMPREHENSIVE METABOLIC PANEL WITH GFR
ALT: 14 IU/L (ref 0–32)
AST: 21 IU/L (ref 0–40)
Albumin: 4 g/dL (ref 3.8–4.8)
Alkaline Phosphatase: 64 IU/L (ref 44–121)
BUN/Creatinine Ratio: 13 (ref 12–28)
BUN: 10 mg/dL (ref 8–27)
Bilirubin Total: 0.2 mg/dL (ref 0.0–1.2)
CO2: 21 mmol/L (ref 20–29)
Calcium: 10 mg/dL (ref 8.7–10.3)
Chloride: 112 mmol/L — ABNORMAL HIGH (ref 96–106)
Creatinine, Ser: 0.77 mg/dL (ref 0.57–1.00)
Globulin, Total: 2 g/dL (ref 1.5–4.5)
Glucose: 130 mg/dL — ABNORMAL HIGH (ref 70–99)
Potassium: 5 mmol/L (ref 3.5–5.2)
Sodium: 144 mmol/L (ref 134–144)
Total Protein: 6 g/dL (ref 6.0–8.5)
eGFR: 80 mL/min/{1.73_m2} (ref >59)

## 2023-12-02 LAB — PHOSPHORUS: Phosphorus: 2.8 mg/dL — ABNORMAL LOW (ref 3.0–4.3)

## 2023-12-02 LAB — PTH, INTACT AND CALCIUM: PTH: 98 pg/mL — ABNORMAL HIGH (ref 15–65)

## 2023-12-02 LAB — MAGNESIUM: Magnesium: 2 mg/dL (ref 1.6–2.3)

## 2023-12-02 LAB — VITAMIN D 25 HYDROXY (VIT D DEFICIENCY, FRACTURES): Vit D, 25-Hydroxy: 36.1 ng/mL (ref 30.0–100.0)

## 2023-12-02 LAB — PTH-RELATED PEPTIDE: PTH-related peptide: <2 pmol/L

## 2023-12-13 ENCOUNTER — Ambulatory Visit (HOSPITAL_COMMUNITY)
Admission: RE | Admit: 2023-12-13 | Discharge: 2023-12-13 | Disposition: A | Discharge status: Home / Self Care | Payer: Medicare Other | Source: Ambulatory Visit | Attending: Family Medicine | Admitting: Family Medicine

## 2023-12-13 DIAGNOSIS — N6489 Other specified disorders of breast: Secondary | ICD-10-CM | POA: Diagnosis not present

## 2023-12-13 DIAGNOSIS — R928 Other abnormal and inconclusive findings on diagnostic imaging of breast: Secondary | ICD-10-CM | POA: Diagnosis not present

## 2023-12-13 DIAGNOSIS — R92323 Mammographic fibroglandular density, bilateral breasts: Secondary | ICD-10-CM | POA: Diagnosis not present

## 2023-12-15 ENCOUNTER — Encounter: Payer: Self-pay | Admitting: Nurse Practitioner

## 2023-12-15 ENCOUNTER — Ambulatory Visit: Admitting: Nurse Practitioner

## 2023-12-15 DIAGNOSIS — E213 Hyperparathyroidism, unspecified: Secondary | ICD-10-CM

## 2023-12-15 NOTE — Progress Notes (Signed)
 Endocrinology Follow Up Note       12/15/2023, 1:49 PM   SUBJECTIVE: Ann Vasquez is a 78 y.o.-year-old female, referred by her  Roxene Cora, PA-C  , for evaluation for hypercalcemia/hyperparathyroidism.   Past Medical History:  Diagnosis Date   Anemia    Cellulitis and abscess of leg    extends to foot   Diabetes mellitus type 2, diet-controlled (HCC)    Frequent headaches    Heart murmur    High cholesterol    HTN (hypertension)    Hyperlipidemia     Past Surgical History:  Procedure Laterality Date   COLONOSCOPY WITH PROPOFOL N/A 05/27/2020   Procedure: COLONOSCOPY WITH PROPOFOL;  Surgeon: Urban Garden, MD;  [facility];  Service: Gastroenterology;  Laterality: N/A;  815   EYE SURGERY Right    detached retina   POLYPECTOMY  05/27/2020   Procedure: POLYPECTOMY;  Surgeon: Urban Garden, MD;  [facility];  Service: Gastroenterology;;   right shoulder  2011   rotator cuff repair, Phyllis Breeze, [facility]   SHOULDER OPEN ROTATOR CUFF REPAIR  10/29/2011   Procedure: left ROTATOR CUFF REPAIR SHOULDER OPEN;  Surgeon: Elsa Halls, MD;  Location: [facility];  Service: Orthopedics;  Laterality: Left;   WISDOM TOOTH EXTRACTION Right 05/19/2021    Social History   Tobacco Use   Smoking status: Never   Smokeless tobacco: Never  Vaping Use   Vaping status: Never Used  Substance Use Topics   Alcohol use: No   Drug use: No    Family History  Problem Relation Age of Onset   Cancer Paternal Grandmother    Anesthesia problems Neg Hx    Malignant hyperthermia Neg Hx    Hypotension Neg Hx    Pseudochol deficiency Neg Hx     Outpatient Encounter Medications as of 12/15/2023  Medication Sig   ACCU-CHEK AVIVA PLUS test strip daily.   Accu-Chek Softclix Lancets lancets daily.   metFORMIN (GLUCOPHAGE-XR) 500 MG 24 hr tablet Take 500 mg by mouth every morning.    rosuvastatin (CRESTOR) 10 MG tablet Take 10 mg by mouth at bedtime.    valsartan (DIOVAN) 320 MG tablet Take 1 tablet (320 mg total) by mouth daily.   Multiple Vitamins-Minerals (MULTIVITAMIN WOMEN 50+ PO) Take by mouth. [address] - Alive Gummy - Patient takes 2 a day (Patient not taking: Reported on 12/15/2023)   No facility-administered encounter medications on file as of 12/15/2023.    No Known Allergies   HPI  Ann Vasquez had first occurrence of hypercalcemia in 2023.  Patient has no previously known history of parathyroid, pituitary, adrenal dysfunctions; no family history of such dysfunctions. -Review of her referral package of most recent labs reveals calcium of 10.4, the corresponding PTH of 68 on 07/19/23.  I reviewed pt's DEXA scans: last Dexa scan from 2016 shows osteoporosis  No prior history of fragility fractures or falls. No history of kidney stones.  No history of CKD. Last BUN/Cr: 19/0.88 on 07/19/23  she was on HCTZ  therapy (stopped at previous visit)- with her BP combo pill, started a few years ago.  No  history of vitamin D deficiency. No recent vitamin D level checked.  she does take a multivitamin with calcium in it.   she eats dairy and green, leafy, vegetables on average amounts.  she does not have a family history of hypercalcemia, pituitary tumors, or thyroid cancer,.   I reviewed her chart and she also has a history of HLD, iron deficiency anemia.    Review of systems  Constitutional: + Minimally fluctuating body weight,  current Body mass index is 26.82 kg/m. , + fatigue, no subjective hyperthermia, no subjective hypothermia Eyes: no blurry vision, no xerophthalmia ENT: no sore throat, no nodules palpated in throat, no dysphagia/odynophagia, no hoarseness Cardiovascular: no chest pain, no shortness of breath, no palpitations, no leg swelling Respiratory: no cough, no shortness of breath Gastrointestinal: no  nausea/vomiting/diarrhea Musculoskeletal: + diffuse bone pain Skin: no rashes, no hyperemia Neurological: no tremors, no numbness, no tingling, no dizziness Psychiatric: no depression, no anxiety -------------------------------------------------------------------------------------------------------------------------------------  OBJECTIVE: BP 138/78 (BP Location: Right Arm, Patient Position: Sitting, Cuff Size: Large) Comment: Retake BP in the right arm.  Patient will take her BP medication when she gets home.  Pulse 60   Ht 4\' 11"  (1.499 m)   Wt 132 lb 12.8 oz (60.2 kg)   BMI 26.82 kg/m , Body mass index is 26.82 kg/m. Wt Readings from Last 3 Encounters:  12/15/23 132 lb 12.8 oz (60.2 kg)  11/16/23 132 lb 9.6 oz (60.1 kg)  05/04/23 130 lb 1.1 oz (59 kg)    BP Readings from Last 3 Encounters:  12/15/23 138/78  11/16/23 130/68  05/04/23 (!) 175/62     Physical Exam- Limited  Constitutional:  Body mass index is 26.82 kg/m. , not in acute distress, normal state of mind Eyes:  EOMI, no exophthalmos Musculoskeletal: no gross deformities, strength intact in all four extremities, no gross restriction of joint movements Skin:  no rashes, no hyperemia Neurological: no tremor with outstretched hands   CMP ( most recent) CMP     Component Value Date/Time   NA 144 11/22/2023 1034   K 5.0 11/22/2023 1034   CL 112 (H) 11/22/2023 1034   CO2 21 11/22/2023 1034   GLUCOSE 130 (H) 11/22/2023 1034   GLUCOSE 179 (H) 02/16/2023 1052   BUN 10 11/22/2023 1034   CREATININE 0.77 11/22/2023 1034   CALCIUM 10.0 11/22/2023 1034   PROT 6.0 11/22/2023 1034   ALBUMIN 4.0 11/22/2023 1034   AST 21 11/22/2023 1034   ALT 14 11/22/2023 1034   ALKPHOS 64 11/22/2023 1034   BILITOT 0.2 11/22/2023 1034   EGFR 80 11/22/2023 1034   GFRNONAA >60 02/16/2023 1052     Diabetic Labs (most recent): No results found for: "HGBA1C", "MICROALBUR"   Lipid Panel ( most recent) Lipid Panel  No results  found for: "CHOL", "TRIG", "HDL", "CHOLHDL", "VLDL", "LDLCALC", "LDLDIRECT", "LABVLDL"    No results found for: "TSH", "FREET4"    Latest Reference Range & Units 11/22/23 10:34  COMPREHENSIVE METABOLIC PANEL WITH GFR  Rpt !  Sodium 134 - 144 mmol/L 144  Potassium 3.5 - 5.2 mmol/L 5.0  Chloride 96 - 106 mmol/L 112 (H)  CO2 20 - 29 mmol/L 21  Glucose 70 - 99 mg/dL 161 (H)  BUN 8 - 27 mg/dL 10  Creatinine 0.96 - 0.45 mg/dL 4.09  Calcium 8.7 - 81.1 mg/dL 91.4  BUN/Creatinine Ratio 12 - 28  13  eGFR >59 mL/min/1.73 80  Phosphorus 3.0 - 4.3 mg/dL 2.8 (L)  Magnesium 1.6 -  2.3 mg/dL 2.0  Alkaline Phosphatase 44 - 121 IU/L 64  Albumin 3.8 - 4.8 g/dL 4.0  AST 0 - 40 IU/L 21  ALT 0 - 32 IU/L 14  Total Protein 6.0 - 8.5 g/dL 6.0  Total Bilirubin 0.0 - 1.2 mg/dL 0.2  Vitamin D, 25-Hydroxy 30.0 - 100.0 ng/mL 36.1  Globulin, Total 1.5 - 4.5 g/dL 2.0  PTH, Intact 15 - 65 pg/mL 98 (H)  PTH-related peptide pmol/L <2.0  PTH Interp  Comment  !: Data is abnormal (H): Data is abnormally high (L): Data is abnormally low Rpt: View report in Results Review for more information  ------------------------------------------------------------------------------------------------  Assessment/Plan: 1. Hypercalcemia / Hyperparathyroidism  Patient has had several instances of elevated calcium, with the highest level being at 10.8 mg/dL. A corresponding intact PTH level was also mildly elevated, at 68.  - She has no known history of vitamin D deficiency, but I cannot see if it has been checked previously.  - She does have osteoporosis but no other apparent complications from hypercalcemia/hyperparathyroidism: no history of nephrolithiasis, fragility fractures. No abdominal pain, no major mood disorders,.  She does have some bone pain as well.  Since the discontinuation of hydrochlorothiazide, her calcium level has returned to normal.  Her PTH remains elevated at 98 and phosphorous was also a bit low at 2.8.   PTH-rp was normal, ruling out malignancy.  Her 24- hr urine specimen rules out FHH as a potential cause.  Her BP remains normal after the discontinuation of her HCTZ, will keep her off of it.   We discussed waiting and repeating labs again in 3 months vs NM parathyroid scan to assess if one of her parathyroid glands is overactive and she elected to have the NM parathyroid scan.  I will call her with the results of that test and what the next steps are.   I spent  38  minutes in the care of the patient today including review of labs from Thyroid Function, CMP, and other relevant labs ; imaging/biopsy records (current and previous including abstractions from other facilities); face-to-face time discussing  her lab results and symptoms, medications doses, her options of short and long term treatment based on the latest standards of care / guidelines;   and documenting the encounter.  Leon Rajas  participated in the discussions, expressed understanding, and voiced agreement with the above plans.  All questions were answered to her satisfaction. she is encouraged to contact clinic should she have any questions or concerns prior to her return visit.   Follow Up Plan: - Return will call with results of NM parathyroid scan and next steps.  Hulon Magic, [facility] [address] [place], [place] [address] Phone: [phone] Fax: [phone]  12/15/2023, 1:49 PM

## 2023-12-19 ENCOUNTER — Ambulatory Visit: Admitting: Nurse Practitioner

## 2023-12-27 ENCOUNTER — Encounter (HOSPITAL_COMMUNITY)
Admission: RE | Admit: 2023-12-27 | Discharge: 2023-12-27 | Disposition: A | Discharge status: Home / Self Care | Source: Ambulatory Visit | Attending: Nurse Practitioner | Admitting: Nurse Practitioner

## 2023-12-27 DIAGNOSIS — E213 Hyperparathyroidism, unspecified: Secondary | ICD-10-CM | POA: Insufficient documentation

## 2023-12-27 MED ORDER — TECHNETIUM TC 99M SESTAMIBI GENERIC - CARDIOLITE
25.0000 | Freq: Once | INTRAVENOUS | Status: AC | PRN
  Administered 2023-12-27: 26 via INTRAVENOUS

## 2023-12-28 ENCOUNTER — Telehealth: Payer: Self-pay | Admitting: *Deleted

## 2023-12-28 ENCOUNTER — Encounter: Payer: Self-pay | Admitting: Nurse Practitioner

## 2023-12-28 NOTE — Progress Notes (Signed)
 Ann Vasquez I sent mychart message going over NM Parathyroid scan.

## 2023-12-28 NOTE — Telephone Encounter (Signed)
-----   Message from Wendel Hals sent at 12/28/2023  7:32 AM EDT ----- Drew Gentleman sent mychart message going over NM Parathyroid scan.

## 2023-12-28 NOTE — Progress Notes (Signed)
 Noted that the patient was sent a message from [place] reviewing her scan results.

## 2023-12-28 NOTE — Telephone Encounter (Signed)
 Noted that patient was sent a message from [place] reviewing the Parathyroid Scan.

## 2024-01-05 NOTE — Telephone Encounter (Signed)
 It doesn't matter, we can overbook if we have to

## 2024-01-05 NOTE — Telephone Encounter (Signed)
 See patient response.

## 2024-01-10 ENCOUNTER — Ambulatory Visit: Admitting: Nurse Practitioner

## 2024-01-10 DIAGNOSIS — E213 Hyperparathyroidism, unspecified: Secondary | ICD-10-CM

## 2024-01-12 ENCOUNTER — Ambulatory Visit: Admitting: Nurse Practitioner

## 2024-01-12 ENCOUNTER — Encounter: Payer: Self-pay | Admitting: Nurse Practitioner

## 2024-01-12 DIAGNOSIS — E213 Hyperparathyroidism, unspecified: Secondary | ICD-10-CM

## 2024-01-12 NOTE — Progress Notes (Signed)
 Endocrinology Follow Up Note       01/12/2024, 9:12 AM   SUBJECTIVE: Ann Vasquez is a 78 y.o.-year-old female, referred by her  Ann Cora, PA-C  , for evaluation for hypercalcemia/hyperparathyroidism.   Past Medical History:  Diagnosis Date   Anemia    Cellulitis and abscess of leg    extends to foot   Diabetes mellitus type 2, diet-controlled (HCC)    Frequent headaches    Heart murmur    High cholesterol    HTN (hypertension)    Hyperlipidemia     Past Surgical History:  Procedure Laterality Date   COLONOSCOPY WITH PROPOFOL  N/A 05/27/2020   Procedure: COLONOSCOPY WITH PROPOFOL ;  Surgeon: Urban Garden, MD;  [facility];  Service: Gastroenterology;  Laterality: N/A;  815   EYE SURGERY Right    detached retina   POLYPECTOMY  05/27/2020   Procedure: POLYPECTOMY;  Surgeon: Urban Garden, MD;  [facility];  Service: Gastroenterology;;   right shoulder  2011   rotator cuff repair, Phyllis Breeze, [facility]   SHOULDER OPEN ROTATOR CUFF REPAIR  10/29/2011   Procedure: left ROTATOR CUFF REPAIR SHOULDER OPEN;  Surgeon: Elsa Halls, MD;  Location: [facility];  Service: Orthopedics;  Laterality: Left;   WISDOM TOOTH EXTRACTION Right 05/19/2021    Social History   Tobacco Use   Smoking status: Never   Smokeless tobacco: Never  Vaping Use   Vaping status: Never Used  Substance Use Topics   Alcohol use: No   Drug use: No    Family History  Problem Relation Age of Onset   Cancer Paternal Grandmother    Anesthesia problems Neg Hx    Malignant hyperthermia Neg Hx    Hypotension Neg Hx    Pseudochol deficiency Neg Hx     Outpatient Encounter Medications as of 01/12/2024  Medication Sig   ACCU-CHEK AVIVA PLUS test strip daily.   Accu-Chek Softclix Lancets lancets daily.   metFORMIN (GLUCOPHAGE-XR) 500 MG 24 hr tablet Take 500 mg by mouth every morning.    rosuvastatin (CRESTOR) 10 MG tablet Take 10 mg by mouth at bedtime.    valsartan  (DIOVAN ) 320 MG tablet Take 1 tablet (320 mg total) by mouth daily.   Multiple Vitamins-Minerals (MULTIVITAMIN WOMEN 50+ PO) Take by mouth. [address] - Alive Gummy - Patient takes 2 a day (Patient not taking: Reported on 01/12/2024)   No facility-administered encounter medications on file as of 01/12/2024.    No Known Allergies   HPI  Ann Vasquez had first occurrence of hypercalcemia in 2023.  Patient has no previously known history of parathyroid , pituitary, adrenal dysfunctions; no family history of such dysfunctions. -Review of her referral package of most recent labs reveals calcium of 10.4, the corresponding PTH of 68 on 07/19/23.  I reviewed pt's DEXA scans: last Dexa scan from 2016 shows osteoporosis  No prior history of fragility fractures or falls. No history of kidney stones.  No history of CKD. Last BUN/Cr: 19/0.88 on 07/19/23  she was on HCTZ  therapy (stopped at previous visit)- with her BP combo pill, started a few years ago.  No  history of vitamin D  deficiency. No recent vitamin D  level checked.  she does take a multivitamin with calcium in it.   she eats dairy and green, leafy, vegetables on average amounts.  she does not have a family history of hypercalcemia, pituitary tumors, or thyroid  cancer,.   I reviewed her chart and she also has a history of HLD, iron deficiency anemia.    Review of systems  Constitutional: + Minimally fluctuating body weight,  current Body mass index is 27.06 kg/m. , + fatigue, no subjective hyperthermia, no subjective hypothermia Eyes: no blurry vision, no xerophthalmia ENT: no sore throat, no nodules palpated in throat, no dysphagia/odynophagia, no hoarseness Cardiovascular: no chest pain, no shortness of breath, no palpitations, no leg swelling Respiratory: no cough, no shortness of breath Gastrointestinal: no  nausea/vomiting/diarrhea Musculoskeletal: + diffuse bone pain- somewhat worse Skin: no rashes, no hyperemia Neurological: no tremors, no numbness, no tingling, no dizziness Psychiatric: no depression, no anxiety -------------------------------------------------------------------------------------------------------------------------------------  OBJECTIVE: BP 118/70 (BP Location: Right Arm, Patient Position: Sitting, Cuff Size: Large)   Pulse (!) 57   Ht 4\' 11"  (1.499 m)   Wt 134 lb (60.8 kg)   BMI 27.06 kg/m , Body mass index is 27.06 kg/m. Wt Readings from Last 3 Encounters:  01/12/24 134 lb (60.8 kg)  12/15/23 132 lb 12.8 oz (60.2 kg)  11/16/23 132 lb 9.6 oz (60.1 kg)    BP Readings from Last 3 Encounters:  01/12/24 118/70  12/15/23 138/78  11/16/23 130/68     Physical Exam- Limited  Constitutional:  Body mass index is 27.06 kg/m. , not in acute distress, normal state of mind Eyes:  EOMI, no exophthalmos Musculoskeletal: no gross deformities, strength intact in all four extremities, no gross restriction of joint movements Skin:  no rashes, no hyperemia Neurological: no tremor with outstretched hands   CMP ( most recent) CMP     Component Value Date/Time   NA 144 11/22/2023 1034   K 5.0 11/22/2023 1034   CL 112 (H) 11/22/2023 1034   CO2 21 11/22/2023 1034   GLUCOSE 130 (H) 11/22/2023 1034   GLUCOSE 179 (H) 02/16/2023 1052   BUN 10 11/22/2023 1034   CREATININE 0.77 11/22/2023 1034   CALCIUM 10.0 11/22/2023 1034   PROT 6.0 11/22/2023 1034   ALBUMIN 4.0 11/22/2023 1034   AST 21 11/22/2023 1034   ALT 14 11/22/2023 1034   ALKPHOS 64 11/22/2023 1034   BILITOT 0.2 11/22/2023 1034   EGFR 80 11/22/2023 1034   GFRNONAA >60 02/16/2023 1052     Diabetic Labs (most recent): No results found for: "HGBA1C", "MICROALBUR"   Lipid Panel ( most recent) Lipid Panel  No results found for: "CHOL", "TRIG", "HDL", "CHOLHDL", "VLDL", "LDLCALC", "LDLDIRECT", "LABVLDL"     No results found for: "TSH", "FREET4"    Latest Reference Range & Units 11/22/23 10:34  COMPREHENSIVE METABOLIC PANEL WITH GFR  Rpt !  Sodium 134 - 144 mmol/L 144  Potassium 3.5 - 5.2 mmol/L 5.0  Chloride 96 - 106 mmol/L 112 (H)  CO2 20 - 29 mmol/L 21  Glucose 70 - 99 mg/dL 161 (H)  BUN 8 - 27 mg/dL 10  Creatinine 0.96 - 0.45 mg/dL 4.09  Calcium 8.7 - 81.1 mg/dL 91.4  BUN/Creatinine Ratio 12 - 28  13  eGFR >59 mL/min/1.73 80  Phosphorus 3.0 - 4.3 mg/dL 2.8 (L)  Magnesium 1.6 - 2.3 mg/dL 2.0  Alkaline Phosphatase 44 - 121 IU/L 64  Albumin 3.8 - 4.8 g/dL 4.0  AST 0 - 40 IU/L 21  ALT 0 - 32 IU/L 14  Total Protein 6.0 - 8.5 g/dL 6.0  Total Bilirubin 0.0 - 1.2 mg/dL 0.2  Vitamin D , 25-Hydroxy 30.0 - 100.0 ng/mL 36.1  Globulin, Total 1.5 - 4.5 g/dL 2.0  PTH, Intact 15 - 65 pg/mL 98 (H)  PTH-related peptide pmol/L <2.0  PTH Interp  Comment  !: Data is abnormal (H): Data is abnormally high (L): Data is abnormally low Rpt: View report in Results Review for more information   NM Parathyroid  scan on 12/27/23 CLINICAL DATA:  Hyperparathyroidism.   EXAM: NM PARATHYROID  SCINTIGRAPHY AND SPECT IMAGING   TECHNIQUE: Following intravenous administration of radiopharmaceutical, early and 2-hour delayed planar images were obtained in the anterior projection. Delayed triplanar SPECT images were also obtained at 2 hours.   RADIOPHARMACEUTICALS:  26.0 mCi Tc-58m Sestamibi IV   COMPARISON:  None Available.   FINDINGS: Normal washout of activity from the thyroid  gland at 2 hours imaging. Mild asymmetric activity within the thyroid  bed with LEFT activity greater than the RIGHT at 2 hour planar imaging.   SPECT imaging of the neck at 2 hour imaging. Demonstrates increased activity within the lower pole of the LEFT lobe of the thyroid  gland. Activity is slightly posterior to the lower pole LEFT.   IMPRESSION: Focal activity localizing posterior to the lower pole of the LEFT lobe of  thyroid  gland is consistent with parathyroid  adenoma.     Electronically Signed   By: Deboraha Fallow M.D.   On: 12/27/2023 14:48 ------------------------------------------------------------------------------------------------  Assessment/Plan: 1. Hypercalcemia / Hyperparathyroidism  Patient has had several instances of elevated calcium, with the highest level being at 10.8 mg/dL. A corresponding intact PTH level was also mildly elevated, at 68.  - She has no known history of vitamin D  deficiency, but I cannot see if it has been checked previously.  - She does have osteoporosis but no other apparent complications from hypercalcemia/hyperparathyroidism: no history of nephrolithiasis, fragility fractures. No abdominal pain, no major mood disorders,.  She does have some bone pain as well.  Since the discontinuation of hydrochlorothiazide, her calcium level has returned to normal.  Her PTH remains elevated at 98 and phosphorous was also a bit low at 2.8.  PTH-rp was normal, ruling out malignancy.  Her 24- hr urine specimen rules out FHH as a potential cause.  Her BP remains normal after the discontinuation of her HCTZ, will keep her off of it.   Her NM parathyroid  scan does show area of localized focal activity in the left lower pole, consistent with parathyroid  adenoma.  We discussed long-term effects of leaving this untreated and she did agree with my recommendation to refer to Dr. Sofia Dunn for possible parathyroidectomy.  Will follow up with her in office after they have had initial consultation vs surgery.    I spent  32  minutes in the care of the patient today including review of labs from Thyroid  Function, CMP, and other relevant labs ; imaging/biopsy records (current and previous including abstractions from other facilities); face-to-face time discussing  her lab results and symptoms, medications doses, her options of short and long term treatment based on the latest standards of care /  guidelines;   and documenting the encounter.  Leon Rajas  participated in the discussions, expressed understanding, and voiced agreement with the above plans.  All questions were answered to her satisfaction. she is encouraged to contact clinic should she have any questions or concerns prior to her  return visit.   Follow Up Plan: - Return will follow up after sees Dr. Sofia Dunn.  Hulon Magic, [facility] [address] [place], [place] [address] Phone: [phone] Fax: [phone]  01/12/2024, 9:12 AM

## 2024-02-07 ENCOUNTER — Ambulatory Visit: Payer: Self-pay | Admitting: Surgery

## 2024-02-07 ENCOUNTER — Other Ambulatory Visit: Payer: Self-pay | Admitting: Surgery

## 2024-02-07 ENCOUNTER — Other Ambulatory Visit (HOSPITAL_COMMUNITY): Payer: Self-pay | Admitting: Surgery

## 2024-02-07 DIAGNOSIS — E0590 Thyrotoxicosis, unspecified without thyrotoxic crisis or storm: Secondary | ICD-10-CM

## 2024-02-07 DIAGNOSIS — E210 Primary hyperparathyroidism: Secondary | ICD-10-CM | POA: Diagnosis not present

## 2024-02-15 ENCOUNTER — Other Ambulatory Visit: Payer: Self-pay | Admitting: Nurse Practitioner

## 2024-02-15 ENCOUNTER — Ambulatory Visit (HOSPITAL_COMMUNITY)
Admission: RE | Admit: 2024-02-15 | Discharge: 2024-02-15 | Disposition: A | Discharge status: Home / Self Care | Source: Ambulatory Visit | Attending: Surgery | Admitting: Surgery

## 2024-02-15 DIAGNOSIS — E0590 Thyrotoxicosis, unspecified without thyrotoxic crisis or storm: Secondary | ICD-10-CM | POA: Insufficient documentation

## 2024-02-15 DIAGNOSIS — E0789 Other specified disorders of thyroid: Secondary | ICD-10-CM | POA: Diagnosis not present

## 2024-02-15 DIAGNOSIS — E213 Hyperparathyroidism, unspecified: Secondary | ICD-10-CM | POA: Diagnosis not present

## 2024-02-22 ENCOUNTER — Encounter (HOSPITAL_COMMUNITY): Admission: RE | Admit: 2024-02-22 | Source: Ambulatory Visit

## 2024-02-22 NOTE — Patient Instructions (Signed)
 SURGICAL WAITING ROOM VISITATION Patients having surgery or a procedure may have no more than 2 support people in the waiting area - these visitors may rotate.    Children under the age of 24 must have an adult with them who is not the patient.  If the patient needs to stay at the hospital during part of their recovery, the visitor guidelines for inpatient rooms apply. Pre-op nurse will coordinate an appropriate time for 1 support person to accompany patient in pre-op.  This support person may not rotate.    Please refer to the [facility] website for the visitor guidelines for Inpatients (after your surgery is over and you are in a regular room).     Your procedure is scheduled on: 02-27-24   Report to [facility] Main Entrance    Report to admitting at 9:15 AM   Call this number if you have problems the morning of surgery [phone]   Do not eat food :After Midnight.   After Midnight you may have the following liquids until 8:30 AM DAY OF SURGERY  Water Non-Citrus Juices (without pulp, NO RED-Apple, White grape, White cranberry) Black Coffee (NO MILK/CREAM OR CREAMERS, sugar ok)  Clear Tea (NO MILK/CREAM OR CREAMERS, sugar ok) regular and decaf                             Plain Jell-O (NO RED)                                           Fruit ices (not with fruit pulp, NO RED)                                     Popsicles (NO RED)                                                               Sports drinks like Gatorade (NO RED)                      If you have questions, please contact your surgeon's office.   FOLLOW  ANY ADDITIONAL PRE OP INSTRUCTIONS YOU RECEIVED FROM YOUR SURGEON'S OFFICE!!!     Oral Hygiene is also important to reduce your risk of infection.                                    Remember - BRUSH YOUR TEETH THE MORNING OF SURGERY WITH YOUR REGULAR TOOTHPASTE   Do NOT smoke after Midnight   Take these medicines the morning of surgery with A SIP OF  WATER:    Tylenol  if needed  Stop all vitamins and herbal supplements 7 days before surgery  How to Manage Your Diabetes Before and After Surgery  Why is it important to control my blood sugar before and after surgery? Improving blood sugar levels before and after surgery helps healing and can limit problems. A way of improving blood sugar control is eating  a healthy diet by:  Eating less sugar and carbohydrates  Increasing activity/exercise  Talking with your doctor about reaching your blood sugar goals High blood sugars (greater than 180 mg/dL) can raise your risk of infections and slow your recovery, so you will need to focus on controlling your diabetes during the weeks before surgery. Make sure that the doctor who takes care of your diabetes knows about your planned surgery including the date and location.  How do I manage my blood sugar before surgery? Check your blood sugar at least 4 times a day, starting 2 days before surgery, to make sure that the level is not too high or low. Check your blood sugar the morning of your surgery when you wake up and every 2 hours until you get to the Short Stay unit. If your blood sugar is less than 70 mg/dL, you will need to treat for low blood sugar: Do not take insulin. Treat a low blood sugar (less than 70 mg/dL) with  cup of clear juice (cranberry or apple), 4 glucose tablets, OR glucose gel. Recheck blood sugar in 15 minutes after treatment (to make sure it is greater than 70 mg/dL). If your blood sugar is not greater than 70 mg/dL on recheck, call [phone] for further instructions. Report your blood sugar to the short stay nurse when you get to Short Stay.  If you are admitted to the hospital after surgery: Your blood sugar will be checked by the staff and you will probably be given insulin after surgery (instead of oral diabetes medicines) to make sure you have good blood sugar levels. The goal for blood sugar control after surgery is  80-180 mg/dL.   WHAT DO I DO ABOUT MY DIABETES MEDICATION?  Do not take oral diabetes medicines (pills) the morning of surgery.(Do not take Metformin the morning of surgery)  DO NOT TAKE THE FOLLOWING 7 DAYS PRIOR TO SURGERY: Ozempic, Wegovy, Rybelsus (Semaglutide), Byetta (exenatide), Bydureon (exenatide ER), Victoza, Saxenda (liraglutide), or Trulicity (dulaglutide) Mounjaro (Tirzepatide) Adlyxin (Lixisenatide), Polyethylene Glycol Loxenatide.  Reviewed and Endorsed by [facility] Patient Education Committee, August 2015  Bring CPAP mask and tubing day of surgery.                              You may not have any metal on your body including hair pins, jewelry, and body piercing             Do not wear make-up, lotions, powders, perfumes, or deodorant  Do not wear nail polish including gel and S&S, artificial/acrylic nails, or any other type of covering on natural nails including finger and toenails. If you have artificial nails, gel coating, etc. that needs to be removed by a nail salon please have this removed prior to surgery or surgery may need to be canceled/ delayed if the surgeon/ anesthesia feels like they are unable to be safely monitored.   Do not shave  48 hours prior to surgery.    Do not bring valuables to the hospital. [facility] IS NOT RESPONSIBLE   FOR VALUABLES.   Contacts, dentures or bridgework may not be worn into surgery.  DO NOT BRING YOUR HOME MEDICATIONS TO THE HOSPITAL. PHARMACY WILL DISPENSE MEDICATIONS LISTED ON YOUR MEDICATION LIST TO YOU DURING YOUR ADMISSION IN THE HOSPITAL!    Patients discharged on the day of surgery will not be allowed to drive home.  Someone NEEDS to stay with you  for the first 24 hours after anesthesia.   Special Instructions: Bring a copy of your healthcare power of attorney and living will documents the day of surgery if you haven't scanned them before.              Please read over the following fact sheets you were given: IF  YOU HAVE QUESTIONS ABOUT YOUR PRE-OP INSTRUCTIONS PLEASE CALL [phone] [facility]  If you received a COVID test during your pre-op visit  it is requested that you wear a mask when out in public, stay away from anyone that may not be feeling well and notify your surgeon if you develop symptoms. If you test positive for Covid or have been in contact with anyone that has tested positive in the last 10 days please notify you surgeon.  [facility] - Preparing for Surgery Before surgery, you can play an important role.  Because skin is not sterile, your skin needs to be as free of germs as possible.  You can reduce the number of germs on your skin by washing with CHG (chlorahexidine gluconate) soap before surgery.  CHG is an antiseptic cleaner which kills germs and bonds with the skin to continue killing germs even after washing. Please DO NOT use if you have an allergy to CHG or antibacterial soaps.  If your skin becomes reddened/irritated stop using the CHG and inform your nurse when you arrive at Short Stay. Do not shave (including legs and underarms) for at least 48 hours prior to the first CHG shower.  You may shave your face/neck.  Please follow these instructions carefully:  1.  Shower with CHG Soap the night before surgery and the  morning of surgery.  2.  If you choose to wash your hair, wash your hair first as usual with your normal  shampoo.  3.  After you shampoo, rinse your hair and body thoroughly to remove the shampoo.                             4.  Use CHG as you would any other liquid soap.  You can apply chg directly to the skin and wash.  Gently with a scrungie or clean washcloth.  5.  Apply the CHG Soap to your body ONLY FROM THE NECK DOWN.   Do   not use on face/ open                           Wound or open sores. Avoid contact with eyes, ears mouth and   genitals (private parts).                       Wash face,  Genitals (private parts) with your normal soap.             6.  Wash  thoroughly, paying special attention to the area where your    surgery  will be performed.  7.  Thoroughly rinse your body with warm water from the neck down.  8.  DO NOT shower/wash with your normal soap after using and rinsing off the CHG Soap.                9.  Pat yourself dry with a clean towel.            10.  Wear clean pajamas.  11.  Place clean sheets on your bed the night of your first shower and do not  sleep with pets. Day of Surgery : Do not apply any lotions/deodorants the morning of surgery.  Please wear clean clothes to the hospital/surgery center.  FAILURE TO FOLLOW THESE INSTRUCTIONS MAY RESULT IN THE CANCELLATION OF YOUR SURGERY  PATIENT SIGNATURE_________________________________  NURSE SIGNATURE__________________________________  ________________________________________________________________________

## 2024-02-22 NOTE — Progress Notes (Signed)
  Date of COVID positive in last 90 days:  PCP - Lucie Mace, PA-C Cardiologist -   Chest x-ray -  EKG -  Stress Test -  ECHO -  Cardiac Cath -  Pacemaker/ICD device last checked: Spinal Cord Stimulator:  Bowel Prep -   Sleep Study -  CPAP -   Fasting Blood Sugar -  Checks Blood Sugar _____ times a day  Last dose of GLP1 agonist-  N/A GLP1 instructions:  Do not take after     Last dose of SGLT-2 inhibitors-  N/A SGLT-2 instructions:  Do not take after     Blood Thinner Instructions:  Last dose:   Time: Aspirin Instructions: Last Dose:  Activity level:  Can go up a flight of stairs and perform activities of daily living without stopping and without symptoms of chest pain or shortness of breath.  Able to exercise without symptoms  Unable to go up a flight of stairs without symptoms of     Anesthesia review: Murmur  Patient denies shortness of breath, fever, cough and chest pain at PAT appointment  Patient verbalized understanding of instructions that were given to them at the PAT appointment. Patient was also instructed that they will need to review over the PAT instructions again at home before surgery.

## 2024-02-23 ENCOUNTER — Encounter (HOSPITAL_COMMUNITY): Payer: Self-pay

## 2024-02-23 ENCOUNTER — Other Ambulatory Visit: Payer: Self-pay

## 2024-02-23 ENCOUNTER — Ambulatory Visit: Payer: Self-pay | Admitting: Surgery

## 2024-02-23 ENCOUNTER — Encounter (HOSPITAL_COMMUNITY)
Admission: RE | Admit: 2024-02-23 | Discharge: 2024-02-23 | Disposition: A | Discharge status: Home / Self Care | Source: Ambulatory Visit | Attending: Surgery | Admitting: Surgery

## 2024-02-23 VITALS — BP 178/69 | HR 52 | Temp 98.5°F | Resp 18 | Ht 59.0 in | Wt 131.4 lb

## 2024-02-23 DIAGNOSIS — E210 Primary hyperparathyroidism: Secondary | ICD-10-CM | POA: Diagnosis not present

## 2024-02-23 DIAGNOSIS — I10 Essential (primary) hypertension: Secondary | ICD-10-CM | POA: Insufficient documentation

## 2024-02-23 DIAGNOSIS — Z01818 Encounter for other preprocedural examination: Secondary | ICD-10-CM | POA: Diagnosis not present

## 2024-02-23 DIAGNOSIS — E119 Type 2 diabetes mellitus without complications: Secondary | ICD-10-CM | POA: Diagnosis not present

## 2024-02-23 DIAGNOSIS — E0590 Thyrotoxicosis, unspecified without thyrotoxic crisis or storm: Secondary | ICD-10-CM | POA: Diagnosis not present

## 2024-02-23 DIAGNOSIS — D649 Anemia, unspecified: Secondary | ICD-10-CM

## 2024-02-23 HISTORY — DX: Gastro-esophageal reflux disease without esophagitis: K21.9

## 2024-02-23 LAB — CBC
HCT: 37.6 % (ref 36.0–46.0)
Hemoglobin: 11.2 g/dL — ABNORMAL LOW (ref 12.0–15.0)
MCH: 23.5 pg — ABNORMAL LOW (ref 26.0–34.0)
MCHC: 29.8 g/dL — ABNORMAL LOW (ref 30.0–36.0)
MCV: 78.8 fL — ABNORMAL LOW (ref 80.0–100.0)
Platelets: 222 10*3/uL (ref 150–400)
RBC: 4.77 MIL/uL (ref 3.87–5.11)
RDW: 14.1 % (ref 11.5–15.5)
WBC: 5.3 10*3/uL (ref 4.0–10.5)
nRBC: 0 % (ref 0.0–0.2)

## 2024-02-23 LAB — BASIC METABOLIC PANEL WITH GFR
Anion gap: 4 — ABNORMAL LOW (ref 5–15)
BUN: 10 mg/dL (ref 8–23)
CO2: 26 mmol/L (ref 22–32)
Calcium: 10.3 mg/dL (ref 8.9–10.3)
Chloride: 109 mmol/L (ref 98–111)
Creatinine, Ser: 0.76 mg/dL (ref 0.44–1.00)
GFR, Estimated: >60 mL/min (ref >60)
Glucose, Bld: 118 mg/dL — ABNORMAL HIGH (ref 70–99)
Potassium: 4.3 mmol/L (ref 3.5–5.1)
Sodium: 139 mmol/L (ref 135–145)

## 2024-02-23 LAB — GLUCOSE, CAPILLARY: Glucose-Capillary: 130 mg/dL — ABNORMAL HIGH (ref 70–99)

## 2024-02-23 LAB — HEMOGLOBIN A1C
Hgb A1c MFr Bld: 7 % — ABNORMAL HIGH (ref 4.8–5.6)
Mean Plasma Glucose: 154.2 mg/dL

## 2024-02-23 NOTE — Progress Notes (Signed)
 Thyroid  USN is normal.  Will proceed with parathyroidectomy as scheduled.  Krystal Spinner, MD [facility] practice Office: [phone]

## 2024-02-24 NOTE — Progress Notes (Signed)
 Anesthesia Chart Review   Case: [id number] Date/Time: 02/27/24 0915   Procedure: PARATHYROIDECTOMY (Left) - left inferior parathyroidectomy   Anesthesia type: General   Diagnosis: Primary hyperparathyroidism (HCC) [E21.0]   Pre-op diagnosis: PRIMARY HYPERTHYROIDISM   [facility] ROOM 01 / [facility]   Surgeons: Eletha Boas, MD       DISCUSSION:77 y.o. never smoker with h/o HTN, DM II, primary hyperparathyroidism scheduled for above procedure 02/27/2024 with Dr. Boas Eletha.   Pt reports she was told many years ago she had a heart murmur.  No murmur noted in physical exam of any notes.  No murmur appreciated on exam.    Pt reports she can climb a flight of stairs without difficulty, denies shortness of breath or chest pain. EKG with no acute findings.  VS: BP (!) 178/69   Pulse (!) 52   Temp 36.9 C (Oral)   Resp 18   Ht 4' 11 (1.499 m)   Wt 59.6 kg   SpO2 100%   BMI 26.54 kg/m   PROVIDERS: Leonce Lucie PARAS, PA-C is PCP    LABS: Labs reviewed: Acceptable for surgery. (all labs ordered are listed, but only abnormal results are displayed)  Labs Reviewed  HEMOGLOBIN A1C - Abnormal; Notable for the following components:      Result Value   Hgb A1c MFr Bld 7.0 (*)    All other components within normal limits  BASIC METABOLIC PANEL WITH GFR - Abnormal; Notable for the following components:   Glucose, Bld 118 (*)    Anion gap 4 (*)    All other components within normal limits  GLUCOSE, CAPILLARY - Abnormal; Notable for the following components:   Glucose-Capillary 130 (*)    All other components within normal limits  CBC - Abnormal; Notable for the following components:   Hemoglobin 11.2 (*)    MCV 78.8 (*)    MCH 23.5 (*)    MCHC 29.8 (*)    All other components within normal limits     IMAGES:   EKG:   CV:  Past Medical History:  Diagnosis Date   Anemia    Cellulitis and abscess of leg    extends to foot   Diabetes mellitus type 2, diet-controlled (HCC)     Frequent headaches    GERD (gastroesophageal reflux disease)    Heart murmur    High cholesterol    HTN (hypertension)    Hyperlipidemia     Past Surgical History:  Procedure Laterality Date   COLONOSCOPY WITH PROPOFOL  N/A 05/27/2020   Procedure: COLONOSCOPY WITH PROPOFOL ;  Surgeon: Eartha Angelia Sieving, MD;  [facility];  Service: Gastroenterology;  Laterality: N/A;  815   EYE SURGERY Right    detached retina   POLYPECTOMY  05/27/2020   Procedure: POLYPECTOMY;  Surgeon: Eartha Angelia Sieving, MD;  [facility];  Service: Gastroenterology;;   right shoulder  2011   rotator cuff repair, Margrette, [facility]   SHOULDER OPEN ROTATOR CUFF REPAIR  10/29/2011   Procedure: left ROTATOR CUFF REPAIR SHOULDER OPEN;  Surgeon: Taft Margrette, MD;  Location: [facility];  Service: Orthopedics;  Laterality: Left;   WISDOM TOOTH EXTRACTION Right 05/19/2021    MEDICATIONS:  ACCU-CHEK AVIVA PLUS test strip   Accu-Chek Softclix Lancets lancets   acetaminophen  (TYLENOL ) 500 MG tablet   metFORMIN (GLUCOPHAGE-XR) 500 MG 24 hr tablet   rosuvastatin (CRESTOR) 10 MG tablet   valsartan  (DIOVAN ) 320 MG tablet   No current facility-administered medications for this  encounter.     Harlene Hoots Ward, PA-C [facility] Pre-Surgical Testing [phone]

## 2024-02-24 NOTE — Anesthesia Preprocedure Evaluation (Signed)
 Anesthesia Evaluation  Patient identified by MRN, date of birth, ID band Patient awake    Reviewed: Allergy & Precautions, H&P , NPO status , Patient's Chart, lab work & pertinent test results  Airway Mallampati: II  TM Distance: >3 FB Neck ROM: Full    Dental no notable dental hx. (+) Teeth Intact, Dental Advisory Given   Pulmonary neg pulmonary ROS   Pulmonary exam normal breath sounds clear to auscultation       Cardiovascular Exercise Tolerance: Good hypertension, Pt. on medications  Rhythm:Regular Rate:Normal     Neuro/Psych  Headaches  negative psych ROS   GI/Hepatic Neg liver ROS,GERD  ,,  Endo/Other  diabetes, Type 2, Oral Hypoglycemic Agents    Renal/GU negative Renal ROS  negative genitourinary   Musculoskeletal   Abdominal   Peds  Hematology  (+) Blood dyscrasia, anemia   Anesthesia Other Findings   Reproductive/Obstetrics negative OB ROS                             Anesthesia Physical Anesthesia Plan  ASA: 2  Anesthesia Plan: General   Post-op Pain Management: Tylenol  PO (pre-op)*   Induction: Intravenous  PONV Risk Score and Plan: 4 or greater and Ondansetron , Dexamethasone  and Treatment may vary due to age or medical condition  Airway Management Planned: Oral ETT  Additional Equipment:   Intra-op Plan:   Post-operative Plan: Extubation in OR  Informed Consent: I have reviewed the patients History and Physical, chart, labs and discussed the procedure including the risks, benefits and alternatives for the proposed anesthesia with the patient or authorized representative who has indicated his/her understanding and acceptance.     Dental advisory given  Plan Discussed with: CRNA  Anesthesia Plan Comments:        Anesthesia Quick Evaluation

## 2024-02-26 ENCOUNTER — Encounter (HOSPITAL_COMMUNITY): Payer: Self-pay | Admitting: Surgery

## 2024-02-26 DIAGNOSIS — E210 Primary hyperparathyroidism: Secondary | ICD-10-CM | POA: Diagnosis present

## 2024-02-26 NOTE — H&P (Signed)
 REFERRING PHYSICIAN: Therisa Benton PARAS, NP  PROVIDER: Arnesia Vincelette OZELL SPINNER, MD   Chief Complaint: New Consultation (Primary hyperparathyroidism)  History of Present Illness:  Patient is referred by Benton Therisa, NP, from endocrinology for surgical evaluation and management of newly diagnosed primary hyperparathyroidism. Patient was noted on routine laboratory studies performed by her primary care provider, Lucie Mace, have elevated serum calcium levels. She was referred to endocrinology. Workup included an intact PTH level which was elevated at 98 with a calcium level ranging from 10.0 up to a level of 10.8. 24-hour urine collection for calcium was at the upper range of normal at 306. 25-hydroxy vitamin D  level was normal at 36.1. In late April 2025 the patient underwent nuclear medicine parathyroid  scanning which was positive for a left inferior parathyroid  adenoma. Patient has not had any other additional imaging. Patient has had no prior head or neck surgery. There is no family history of parathyroid  disease. Patient does note chronic fatigue. She complains of bone and joint discomfort. She denies nephrolithiasis. She does have osteoporosis. Patient presents today to discuss parathyroid  surgery.  Review of Systems: A complete review of systems was obtained from the patient. I have reviewed this information and discussed as appropriate with the patient. See HPI as well for other ROS.  Review of Systems  Constitutional: Positive for malaise/fatigue.  HENT: Negative.  Eyes: Negative.  Respiratory: Negative.  Cardiovascular: Negative.  Gastrointestinal: Negative.  Genitourinary: Negative.  Musculoskeletal: Positive for joint pain.  Skin: Negative.  Neurological: Negative. Negative for tremors.  Endo/Heme/Allergies: Negative.  Psychiatric/Behavioral: Negative.    Medical History: Past Medical History:  Diagnosis Date  Diabetes mellitus without complication (CMS/HHS-HCC)   Hyperlipidemia  Hypertension   Patient Active Problem List  Diagnosis  Primary hyperparathyroidism (CMS/HHS-HCC)   Past Surgical History:  Procedure Laterality Date  Rotator Cuff Surgery 2014    No Known Allergies  Current Outpatient Medications on File Prior to Visit  Medication Sig Dispense Refill  metFORMIN (GLUCOPHAGE-XR) 500 MG XR tablet Take 500 mg by mouth every morning  rosuvastatin (CRESTOR) 10 MG tablet Take 10 mg by mouth at bedtime  valsartan  (DIOVAN ) 320 MG tablet Take 320 mg by mouth once daily   No current facility-administered medications on file prior to visit.   Family History  Problem Relation Age of Onset  High blood pressure (Hypertension) Mother  Stroke Father    Social History   Tobacco Use  Smoking Status Never  Smokeless Tobacco Never    Social History   Socioeconomic History  Marital status: Widowed  Tobacco Use  Smoking status: Never  Smokeless tobacco: Never  Vaping Use  Vaping status: Never Used  Substance and Sexual Activity  Alcohol use: Never  Drug use: Never   Social Drivers of Health   Housing Stability: Unknown (02/07/2024)  Housing Stability Vital Sign  Homeless in the Last Year: No   Objective:   Vitals:   BP: (!) 178/73  Pulse: 65  Temp: 36.6 C (97.8 F)  SpO2: 99%  Weight: 60.1 kg (132 lb 9.6 oz)  Height: 149.9 cm (4' 11)  PainSc: 0-No pain  PainLoc: Neck   Body mass index is 26.78 kg/m.  Physical Exam   GENERAL APPEARANCE Comfortable, no acute issues Development: normal Gross deformities: none  SKIN Rash, lesions, ulcers: none Induration, erythema: none Nodules: none palpable  EYES Conjunctiva and lids: normal Pupils: equal  EARS, NOSE, MOUTH, THROAT External ears: no lesion or deformity External nose: no lesion or deformity  Hearing: grossly normal  NECK Symmetric: yes Trachea: midline Thyroid : no palpable nodules in the thyroid  bed  CHEST/CV Not assessed  ABDOMEN Not  assessed  GENITOURINARY/RECTAL Not assessed  MUSCULOSKELETAL Station and gait: normal Digits and nails: no clubbing or cyanosis Muscle strength: grossly normal all extremities Deformity: none  LYMPHATIC Cervical: none palpable Supraclavicular: none palpable  PSYCHIATRIC Oriented to person, place, and time: yes Mood and affect: normal for situation Judgment and insight: appropriate for situation   Assessment and Plan:   Primary hyperparathyroidism (CMS/HHS-HCC)  Patient is referred by Benton Rio, NP, for surgical evaluation and management of newly diagnosed primary hyperparathyroidism.  Patient provided with a copy of Parathyroid  Surgery: Treatment for Your Parathyroid  Gland Problem, published by [employer], 12 pages. Book reviewed and explained to patient during visit today.  Today we reviewed her clinical history. We reviewed her symptoms. We reviewed her recent laboratory studies. We reviewed the results of her nuclear medicine parathyroid  scan with sestamibi. 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 She will be a good candidate for minimally invasive surgery. Today we discussed the surgical procedure. We discussed the size and location of the surgical incision. We discussed risk and benefits of surgery including the risk of recurrent laryngeal nerve injury. We discussed doing this as an outpatient procedure. We discussed the postoperative recovery.  Prior to surgery I would like to obtain an ultrasound examination to rule out any concurrent thyroid  disease and to hopefully confirm the location of the parathyroid  adenoma.  Krystal Spinner, MD [facility] practice Office: [phone]

## 2024-02-27 ENCOUNTER — Other Ambulatory Visit: Payer: Self-pay

## 2024-02-27 ENCOUNTER — Encounter (HOSPITAL_COMMUNITY)
Admission: RE | Disposition: A | Discharge status: Home / Self Care | Payer: Self-pay | Source: Home / Self Care | Attending: Surgery

## 2024-02-27 ENCOUNTER — Ambulatory Visit (HOSPITAL_BASED_OUTPATIENT_CLINIC_OR_DEPARTMENT_OTHER): Payer: Self-pay | Admitting: Anesthesiology

## 2024-02-27 ENCOUNTER — Encounter (HOSPITAL_COMMUNITY): Payer: Self-pay | Admitting: Surgery

## 2024-02-27 ENCOUNTER — Ambulatory Visit (HOSPITAL_COMMUNITY)
Admission: RE | Admit: 2024-02-27 | Discharge: 2024-02-27 | Disposition: A | Discharge status: Home / Self Care | Attending: Surgery | Admitting: Surgery

## 2024-02-27 ENCOUNTER — Ambulatory Visit (HOSPITAL_COMMUNITY): Payer: Self-pay | Admitting: Physician Assistant

## 2024-02-27 DIAGNOSIS — D649 Anemia, unspecified: Secondary | ICD-10-CM | POA: Diagnosis not present

## 2024-02-27 DIAGNOSIS — Z7984 Long term (current) use of oral hypoglycemic drugs: Secondary | ICD-10-CM | POA: Diagnosis not present

## 2024-02-27 DIAGNOSIS — E210 Primary hyperparathyroidism: Secondary | ICD-10-CM | POA: Diagnosis present

## 2024-02-27 DIAGNOSIS — K219 Gastro-esophageal reflux disease without esophagitis: Secondary | ICD-10-CM | POA: Insufficient documentation

## 2024-02-27 DIAGNOSIS — R519 Headache, unspecified: Secondary | ICD-10-CM | POA: Diagnosis not present

## 2024-02-27 DIAGNOSIS — D351 Benign neoplasm of parathyroid gland: Secondary | ICD-10-CM | POA: Diagnosis not present

## 2024-02-27 DIAGNOSIS — I10 Essential (primary) hypertension: Secondary | ICD-10-CM | POA: Insufficient documentation

## 2024-02-27 DIAGNOSIS — E119 Type 2 diabetes mellitus without complications: Secondary | ICD-10-CM | POA: Diagnosis not present

## 2024-02-27 DIAGNOSIS — Z79899 Other long term (current) drug therapy: Secondary | ICD-10-CM | POA: Diagnosis not present

## 2024-02-27 HISTORY — PX: PARATHYROIDECTOMY: SHX19

## 2024-02-27 LAB — GLUCOSE, CAPILLARY
Glucose-Capillary: 143 mg/dL — ABNORMAL HIGH (ref 70–99)
Glucose-Capillary: 162 mg/dL — ABNORMAL HIGH (ref 70–99)

## 2024-02-27 SURGERY — PARATHYROIDECTOMY
Anesthesia: General | Laterality: Left

## 2024-02-27 MED ORDER — FENTANYL CITRATE (PF) 100 MCG/2ML IJ SOLN
INTRAMUSCULAR | Status: DC | PRN
  Administered 2024-02-27: 25 ug via INTRAVENOUS
  Administered 2024-02-27: 50 ug via INTRAVENOUS
  Administered 2024-02-27: 25 ug via INTRAVENOUS

## 2024-02-27 MED ORDER — LIDOCAINE HCL (PF) 2 % IJ SOLN
INTRAMUSCULAR | Status: DC | PRN
Start: 2024-02-27 — End: 2024-02-27
  Administered 2024-02-27: 60 mg via INTRADERMAL

## 2024-02-27 MED ORDER — LIDOCAINE HCL (PF) 2 % IJ SOLN
INTRAMUSCULAR | Status: AC
  Filled 2024-02-27: qty 5

## 2024-02-27 MED ORDER — PROPOFOL 10 MG/ML IV BOLUS
INTRAVENOUS | Status: DC | PRN
  Administered 2024-02-27: 120 mg via INTRAVENOUS

## 2024-02-27 MED ORDER — CHLORHEXIDINE GLUCONATE CLOTH 2 % EX PADS: 6.0000 | MEDICATED_PAD | Freq: Once | CUTANEOUS | Status: DC

## 2024-02-27 MED ORDER — FENTANYL CITRATE PF 50 MCG/ML IJ SOSY
PREFILLED_SYRINGE | INTRAMUSCULAR | Status: AC
  Filled 2024-02-27: qty 1

## 2024-02-27 MED ORDER — FENTANYL CITRATE PF 50 MCG/ML IJ SOSY
25.0000 ug | PREFILLED_SYRINGE | INTRAMUSCULAR | Status: DC | PRN
  Administered 2024-02-27: 25 ug via INTRAVENOUS
  Administered 2024-02-27: 50 ug via INTRAVENOUS
  Administered 2024-02-27: 25 ug via INTRAVENOUS

## 2024-02-27 MED ORDER — PHENYLEPHRINE 80 MCG/ML (10ML) SYRINGE FOR IV PUSH (FOR BLOOD PRESSURE SUPPORT)
PREFILLED_SYRINGE | INTRAVENOUS | Status: AC
  Filled 2024-02-27: qty 10

## 2024-02-27 MED ORDER — LACTATED RINGERS IV SOLN: INTRAVENOUS | Status: DC

## 2024-02-27 MED ORDER — PHENYLEPHRINE 80 MCG/ML (10ML) SYRINGE FOR IV PUSH (FOR BLOOD PRESSURE SUPPORT)
PREFILLED_SYRINGE | INTRAVENOUS | Status: DC | PRN
  Administered 2024-02-27: 80 ug via INTRAVENOUS

## 2024-02-27 MED ORDER — ROCURONIUM BROMIDE 10 MG/ML (PF) SYRINGE
PREFILLED_SYRINGE | INTRAVENOUS | Status: DC | PRN
  Administered 2024-02-27: 50 mg via INTRAVENOUS

## 2024-02-27 MED ORDER — ONDANSETRON HCL 4 MG/2ML IJ SOLN
INTRAMUSCULAR | Status: AC
  Filled 2024-02-27: qty 2

## 2024-02-27 MED ORDER — FENTANYL CITRATE (PF) 100 MCG/2ML IJ SOLN
INTRAMUSCULAR | Status: AC
  Filled 2024-02-27: qty 2

## 2024-02-27 MED ORDER — ROCURONIUM BROMIDE 10 MG/ML (PF) SYRINGE
PREFILLED_SYRINGE | INTRAVENOUS | Status: AC
  Filled 2024-02-27: qty 10

## 2024-02-27 MED ORDER — CHLORHEXIDINE GLUCONATE 0.12 % MT SOLN
15.0000 mL | Freq: Once | OROMUCOSAL | Status: AC
  Administered 2024-02-27: 15 mL via OROMUCOSAL

## 2024-02-27 MED ORDER — CEFAZOLIN SODIUM-DEXTROSE 2-4 GM/100ML-% IV SOLN
2.0000 g | INTRAVENOUS | Status: AC
  Administered 2024-02-27: 2 g via INTRAVENOUS
  Filled 2024-02-27: qty 100

## 2024-02-27 MED ORDER — ONDANSETRON HCL 4 MG/2ML IJ SOLN
INTRAMUSCULAR | Status: DC | PRN
  Administered 2024-02-27: 4 mg via INTRAVENOUS

## 2024-02-27 MED ORDER — INSULIN ASPART 100 UNIT/ML IJ SOLN: 0.0000 [IU] | INTRAMUSCULAR | Status: DC | PRN

## 2024-02-27 MED ORDER — ACETAMINOPHEN 500 MG PO TABS
1000.0000 mg | ORAL_TABLET | Freq: Once | ORAL | Status: AC
  Administered 2024-02-27: 1000 mg via ORAL
  Filled 2024-02-27: qty 2

## 2024-02-27 MED ORDER — SUGAMMADEX SODIUM 200 MG/2ML IV SOLN
INTRAVENOUS | Status: DC | PRN
  Administered 2024-02-27: 200 mg via INTRAVENOUS

## 2024-02-27 MED ORDER — DEXAMETHASONE SODIUM PHOSPHATE 10 MG/ML IJ SOLN
INTRAMUSCULAR | Status: DC | PRN
  Administered 2024-02-27: 6 mg via INTRAVENOUS

## 2024-02-27 MED ORDER — DEXAMETHASONE SODIUM PHOSPHATE 10 MG/ML IJ SOLN
INTRAMUSCULAR | Status: AC
  Filled 2024-02-27: qty 1

## 2024-02-27 MED ORDER — BUPIVACAINE-EPINEPHRINE (PF) 0.25% -1:200000 IJ SOLN
INTRAMUSCULAR | Status: AC
Start: 2024-02-27 — End: 2024-02-27
  Filled 2024-02-27: qty 30

## 2024-02-27 MED ORDER — PROPOFOL 10 MG/ML IV BOLUS
INTRAVENOUS | Status: AC
  Filled 2024-02-27: qty 20

## 2024-02-27 MED ORDER — TRAMADOL HCL 50 MG PO TABS: 50.0000 mg | ORAL_TABLET | Freq: Four times a day (QID) | ORAL | 0 refills | Status: AC | PRN

## 2024-02-27 MED ORDER — FENTANYL CITRATE PF 50 MCG/ML IJ SOSY
PREFILLED_SYRINGE | INTRAMUSCULAR | Status: AC
  Filled 2024-02-27: qty 2

## 2024-02-27 MED ORDER — ORAL CARE MOUTH RINSE: 15.0000 mL | Freq: Once | OROMUCOSAL | Status: AC

## 2024-02-27 MED ORDER — 0.9 % SODIUM CHLORIDE (POUR BTL) OPTIME
TOPICAL | Status: DC | PRN
  Administered 2024-02-27: 1000 mL

## 2024-02-27 MED ORDER — BUPIVACAINE HCL 0.25 % IJ SOLN
INTRAMUSCULAR | Status: DC | PRN
  Administered 2024-02-27: 10 mL

## 2024-02-27 MED ORDER — HEMOSTATIC AGENTS (NO CHARGE) OPTIME
TOPICAL | Status: DC | PRN
Start: 2024-02-27 — End: 2024-02-27
  Administered 2024-02-27: 1 via TOPICAL

## 2024-02-27 SURGICAL SUPPLY — 29 items
ATTRACTOMAT 16X20 MAGNETIC DRP (DRAPES) ×2 IMPLANT
BAG COUNTER SPONGE SURGICOUNT (BAG) ×2 IMPLANT
BLADE SURG 15 STRL LF DISP TIS (BLADE) ×2 IMPLANT
CHLORAPREP W/TINT 26 (MISCELLANEOUS) ×2 IMPLANT
CLIP TI MEDIUM 6 (CLIP) ×4 IMPLANT
CLIP TI WIDE RED SMALL 6 (CLIP) ×4 IMPLANT
COVER SURGICAL LIGHT HANDLE (MISCELLANEOUS) ×2 IMPLANT
DERMABOND ADVANCED .7 DNX12 (GAUZE/BANDAGES/DRESSINGS) ×2 IMPLANT
DRAPE LAPAROTOMY T 98X78 PEDS (DRAPES) ×2 IMPLANT
DRAPE UTILITY XL STRL (DRAPES) ×2 IMPLANT
ELECT REM PT RETURN 15FT ADLT (MISCELLANEOUS) ×2 IMPLANT
GAUZE 4X4 16PLY ~~LOC~~+RFID DBL (SPONGE) ×2 IMPLANT
GLOVE SURG ORTHO 8.0 STRL STRW (GLOVE) ×2 IMPLANT
GOWN STRL REUS W/ TWL XL LVL3 (GOWN DISPOSABLE) ×6 IMPLANT
HEMOSTAT SURGICEL 2X4 FIBR (HEMOSTASIS) ×2 IMPLANT
ILLUMINATOR WAVEGUIDE N/F (MISCELLANEOUS) IMPLANT
KIT BASIN OR (CUSTOM PROCEDURE TRAY) ×2 IMPLANT
KIT TURNOVER KIT A (KITS) ×2 IMPLANT
NDL HYPO 22X1.5 SAFETY MO (MISCELLANEOUS) ×2 IMPLANT
NEEDLE HYPO 22X1.5 SAFETY MO (MISCELLANEOUS) ×1 IMPLANT
PACK BASIC VI WITH GOWN DISP (CUSTOM PROCEDURE TRAY) ×2 IMPLANT
PENCIL SMOKE EVACUATOR (MISCELLANEOUS) ×2 IMPLANT
SHEARS HARMONIC 9CM CVD (BLADE) IMPLANT
SUT MNCRL AB 4-0 PS2 18 (SUTURE) ×2 IMPLANT
SUT VIC AB 3-0 SH 18 (SUTURE) ×2 IMPLANT
SYR BULB IRRIG 60ML STRL (SYRINGE) ×2 IMPLANT
SYR CONTROL 10ML LL (SYRINGE) ×2 IMPLANT
TOWEL OR 17X26 10 PK STRL BLUE (TOWEL DISPOSABLE) ×2 IMPLANT
TUBING CONNECTING 10 (TUBING) ×2 IMPLANT

## 2024-02-27 NOTE — Anesthesia Postprocedure Evaluation (Signed)
 Anesthesia Post Note  Patient: Ann Vasquez  Procedure(s) Performed: PARATHYROIDECTOMY (Left)     Patient location during evaluation: PACU Anesthesia Type: General Level of consciousness: awake and alert Pain management: pain level controlled Vital Signs Assessment: post-procedure vital signs reviewed and stable Respiratory status: spontaneous breathing, nonlabored ventilation and respiratory function stable Cardiovascular status: blood pressure returned to baseline and stable Postop Assessment: no apparent nausea or vomiting Anesthetic complications: no  No notable events documented.  Last Vitals:  Vitals:   02/27/24 1115 02/27/24 1145  BP: (!) 172/71 (!) 190/70  Pulse: 67 61  Resp: 12 14  Temp:    SpO2: 92% 94%    Last Pain:  Vitals:   02/27/24 1145  TempSrc:   PainSc: 3                  Teliah Buffalo,W. EDMOND

## 2024-02-27 NOTE — Interval H&P Note (Signed)
 History and Physical Interval Note:  02/27/2024 8:47 AM  Ann Vasquez  has presented today for surgery, with the diagnosis of PRIMARY HYPERTHYROIDISM.  The various methods of treatment have been discussed with the patient and family. After consideration of risks, benefits and other options for treatment, the patient has consented to    Procedure(s) with comments: PARATHYROIDECTOMY (Left) - left inferior parathyroidectomy as a surgical intervention.    The patient's history has been reviewed, patient examined, no change in status, stable for surgery.  I have reviewed the patient's chart and labs.  Questions were answered to the patient's satisfaction.    Krystal Spinner, MD [facility] practice Office: [phone]  Krystal Spinner

## 2024-02-27 NOTE — Op Note (Signed)
 OPERATIVE REPORT - PARATHYROIDECTOMY  Preoperative diagnosis: Primary hyperparathyroidism  Postop diagnosis: Same  Procedure: Left superior minimally invasive parathyroidectomy  Surgeon:  Krystal Spinner, MD  Anesthesia: General endotracheal  Estimated blood loss: Minimal  Preparation: ChloraPrep  Indications: Patient is referred by Benton Rio, NP, from endocrinology for surgical evaluation and management of newly diagnosed primary hyperparathyroidism. Patient was noted on routine laboratory studies performed by her primary care provider, Lucie Mace, have elevated serum calcium levels. She was referred to endocrinology. Workup included an intact PTH level which was elevated at 98 with a calcium level ranging from 10.0 up to a level of 10.8. 24-hour urine collection for calcium was at the upper range of normal at 306. 25-hydroxy vitamin D  level was normal at 36.1. In late April 2025 the patient underwent nuclear medicine parathyroid  scanning which was positive for a left inferior parathyroid  adenoma. Patient has not had any other additional imaging. Patient has had no prior head or neck surgery. There is no family history of parathyroid  disease. Patient does note chronic fatigue. She complains of bone and joint discomfort. She denies nephrolithiasis. She does have osteoporosis. Patient presents today to discuss parathyroid  surgery.   Procedure: The patient was prepared in the pre-operative holding area. The patient was brought to the operating room and placed in a supine position on the operating room table. Following administration of general anesthesia, the patient was positioned and then prepped and draped in the usual strict aseptic fashion. After ascertaining that an adequate level of anesthesia been achieved, a neck incision was made with a #15 blade. Dissection was carried through subcutaneous tissues and platysma. Hemostasis was obtained with the electrocautery. Skin flaps were  developed circumferentially and a Weitlander retractor was placed for exposure.  Strap muscles were incised in the midline. Strap muscles were reflected laterally exposing the thyroid  lobe. With gentle blunt dissection the thyroid  lobe was mobilized.  At the inferior pole of the left lobe of the thyroid  a normal parathyroid  gland was identified.  This was left in situ.  Dissection was carried posteriorly and an enlarged parathyroid  gland was identified lying on the precervical fascia adjacent to the left lateral edge of the esophagus. It was gently mobilized. Vascular structures were followed cephalad for approximately 2 cm and then were divided between ligaclips. Care was taken to avoid the recurrent laryngeal nerve. The parathyroid  gland was completely excised. It was submitted to pathology where frozen section confirmed hypercellular parathyroid  tissue consistent with adenoma.  Neck was irrigated with warm saline and good hemostasis was noted. Fibrillar was placed in the operative field. Strap muscles were approximated in the midline with interrupted 3-0 Vicryl sutures. Platysma was closed with interrupted 3-0 Vicryl sutures. Marcaine  was infiltrated circumferentially. Skin was closed with a running 4-0 Monocryl subcuticular suture. Wound was washed and dried and Dermabond was applied. Patient was awakened from anesthesia and brought to the recovery room. The patient tolerated the procedure well.   Krystal Spinner, MD [facility] Surgery Office: [phone]

## 2024-02-27 NOTE — Transfer of Care (Signed)
 Immediate Anesthesia Transfer of Care Note  Patient: Ann Vasquez  Procedure(s) Performed: PARATHYROIDECTOMY (Left)  Patient Location: PACU  Anesthesia Type:General  Level of Consciousness: awake, alert , oriented, and patient cooperative  Airway & Oxygen Therapy: Patient Spontanous Breathing and Patient connected to face mask oxygen  Post-op Assessment: Report given to RN, Post -op Vital signs reviewed and stable, and Patient moving all extremities  Post vital signs: Reviewed and stable  Last Vitals:  Vitals Value Taken Time  BP 190/81 02/27/24 10:10  Temp    Pulse 71 02/27/24 10:12  Resp 11 02/27/24 10:12  SpO2 100 % 02/27/24 10:12  Vitals shown include unfiled device data.  Last Pain:  Vitals:   02/27/24 0743  TempSrc:   PainSc: 0-No pain         Complications: No notable events documented.

## 2024-02-27 NOTE — Discharge Instructions (Addendum)

## 2024-02-27 NOTE — Anesthesia Procedure Notes (Signed)
 Procedure Name: Intubation Date/Time: 02/27/2024 9:06 AM  Performed by: Memory Armida LABOR, CRNAPre-anesthesia Checklist: Patient identified, Emergency Drugs available, Suction available, Patient being monitored and Timeout performed Patient Re-evaluated:Patient Re-evaluated prior to induction Oxygen Delivery Method: Circle system utilized Preoxygenation: Pre-oxygenation with 100% oxygen Induction Type: IV induction Ventilation: Mask ventilation without difficulty Laryngoscope Size: Mac and 4 Grade View: Grade I Tube type: Oral Tube size: 7.0 mm Number of attempts: 1 Airway Equipment and Method: Stylet Placement Confirmation: ETT inserted through vocal cords under direct vision, positive ETCO2 and breath sounds checked- equal and bilateral Secured at: 21 cm Tube secured with: Tape Dental Injury: Teeth and Oropharynx as per pre-operative assessment

## 2024-02-28 ENCOUNTER — Encounter (HOSPITAL_COMMUNITY): Payer: Self-pay | Admitting: Surgery

## 2024-02-29 LAB — SURGICAL PATHOLOGY

## 2024-03-20 ENCOUNTER — Other Ambulatory Visit: Payer: Self-pay

## 2024-03-20 DIAGNOSIS — E210 Primary hyperparathyroidism: Secondary | ICD-10-CM | POA: Diagnosis not present

## 2024-03-20 DIAGNOSIS — Z9889 Other specified postprocedural states: Secondary | ICD-10-CM | POA: Diagnosis not present

## 2024-03-20 DIAGNOSIS — Z9089 Acquired absence of other organs: Secondary | ICD-10-CM | POA: Diagnosis not present

## 2024-03-20 NOTE — Progress Notes (Signed)
   PROVIDER:  KRYSTAL OZELL SPINNER, MD  MRN: [id number] DOB: [birth year] DATE OF ENCOUNTER: 03/20/2024 Interval History:   Patient returns for postoperative visit having undergone minimally invasive parathyroidectomy on February 27, 2024.  Final pathology shows a parathyroid  adenoma measuring 1.8 cm in diameter and weighing 688 mg.  Patient notes that her energy level has improved.  She is having less bone and joint discomfort.  Patient has not had postoperative laboratory studies.  Physical Examination:   Anterior cervical incision is healing nicely.  Mild soft tissue swelling.  No sign of seroma or hematoma or infection.  Voice quality is normal.   Assessment and Plan:    Diagnoses and all orders for this visit:  Status post parathyroidectomy    Patient has done well following parathyroidectomy.  Clinically she is symptomatically improved.  Patient will go today for laboratory studies.  We will check her calcium level and her PTH level for comparison to her preoperative levels.  Patient will begin applying topical creams to her incision as instructed below.  Patient will return for surgical care as needed.  CARE OF SURGICAL INCISION   If there is still some Dermabond on your incision, use Vaseline to remove completely.  Apply Palmer's Cocoa Butter with Vitamin E cream to your incision 2 or 3 times daily.  Massage cream into incision for one minute with each application using small circular motions.  Use sunscreen (50 SPF or higher) for the first 6 months after surgery if area is exposed to sun.  You may alternate Mederma or other scar reducing cream with the cocoa butter cream if desired.   KRYSTAL SPINNER, MD [facility] Surgery Office: [phone]

## 2024-03-22 LAB — PARATHYROID HORMONE, INTACT (NO CA): PTH: 69 pg/mL — ABNORMAL HIGH (ref 15–65)

## 2024-03-22 LAB — CALCIUM: Calcium: 10.4 mg/dL — ABNORMAL HIGH (ref 8.7–10.3)

## 2024-05-25 ENCOUNTER — Other Ambulatory Visit: Payer: Self-pay | Admitting: Nurse Practitioner

## 2024-06-05 ENCOUNTER — Ambulatory Visit: Admitting: Nurse Practitioner

## 2024-06-12 LAB — OPHTHALMOLOGY REPORT-SCANNED

## 2024-07-19 ENCOUNTER — Ambulatory Visit
Admission: EM | Admit: 2024-07-19 | Discharge: 2024-07-19 | Disposition: A | Discharge status: Home / Self Care | Attending: Nurse Practitioner | Admitting: Nurse Practitioner

## 2024-07-19 DIAGNOSIS — R21 Rash and other nonspecific skin eruption: Secondary | ICD-10-CM | POA: Diagnosis not present

## 2024-07-19 MED ORDER — TRIAMCINOLONE ACETONIDE 0.025 % EX CREA: 1.0000 | TOPICAL_CREAM | Freq: Two times a day (BID) | CUTANEOUS | 0 refills | Status: AC

## 2024-07-19 NOTE — Discharge Instructions (Signed)
 I am unsure what is causing the redness on your face.  You can start using the Kenalog cream to help with redness and itching.  Do not use this for more than 1 week; if symptoms persist longer than 1 week or worsen, seek care in the emergency room.  In addition to the Kenalog cream, you can take an oral antihistamine like Zyrtec , Claritin, or Allegra to help with itching.  Seek care in the emergency room if the area gets bigger, more swollen, more painful, or a darkened center.

## 2024-07-19 NOTE — ED Provider Notes (Signed)
 [facility]    CSN: [id number] Arrival date & time: 07/19/24  1203      History   Chief Complaint Chief Complaint  Patient presents with   Insect Bite    HPI Ann Vasquez is a 78 y.o. female.   Patient presents today for a circular, red area under her right eye.  Reports she was visiting her sister when her sister noticed it.  She denies pain associated with the area.  Denies feeling or seeing a bug on her face or feeling an insect sting.  No new detergents, soaps, or personal care products.  Has not tried anything for it so far.  She reports is a little bit itchy near the corner of her nose.  Reports this area was not there when she woke up this morning.    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/08/2012   Shoulder pain 11/17/2011   Shoulder stiffness 11/17/2011   Muscle weakness (generalized) 11/17/2011   S/P shoulder surgery 11/01/2011   Biceps tendon rupture, proximal 10/25/2011   Rotator cuff tear, left 10/25/2011   Rotator cuff tear 10/05/2011   Bursitis of shoulder, left 09/28/2011   Back pain 01/26/2011   IMPINGEMENT SYNDROME 09/29/2009   RUPTURE ROTATOR CUFF 09/29/2009   FIBROIDS, UTERUS 06/27/2009   DM 06/27/2009   HYPERLIPIDEMIA 06/27/2009   Other iron deficiency anemia 06/27/2009   GERD 06/27/2009    Past Surgical History:  Procedure Laterality Date   COLONOSCOPY WITH PROPOFOL  N/A 05/27/2020   Procedure: COLONOSCOPY WITH PROPOFOL ;  Surgeon: Eartha Angelia Sieving, MD;  Location: [facility];  Service: Gastroenterology;   Laterality: N/A;  815   EYE SURGERY Right    detached retina   PARATHYROIDECTOMY Left 02/27/2024   Procedure: PARATHYROIDECTOMY;  Surgeon: Eletha Boas, MD;  Location: [facility];  Service: General;  Laterality: Left;  left inferior parathyroidectomy   POLYPECTOMY  05/27/2020   Procedure: POLYPECTOMY;  Surgeon: Eartha Angelia Sieving, MD;  Location: [facility];  Service: Gastroenterology;;   right shoulder  2011   rotator cuff repair, Margrette, [facility]   SHOULDER OPEN ROTATOR CUFF REPAIR  10/29/2011   Procedure: left ROTATOR CUFF REPAIR SHOULDER OPEN;  Surgeon: Taft Margrette, MD;  Location: [facility];  Service: Orthopedics;  Laterality: Left;   WISDOM TOOTH EXTRACTION Right 05/19/2021    OB History   No obstetric history on file.      Home Medications    Prior to Admission medications   Medication Sig Start Date End Date Taking? Authorizing Provider  triamcinolone (KENALOG) 0.025 % cream Apply 1 Application topically 2 (two) times daily. Apply up to twice daily as needed for redness/itching.  Do not use for more than 1 week. 07/19/24  Yes Chandra Harlene LABOR, NP  ACCU-CHEK AVIVA PLUS test strip daily. 09/07/23   [provider]  Accu-Chek Softclix Lancets lancets daily. 09/07/23   [provider]  acetaminophen  (TYLENOL ) 500 MG tablet Take 500-1,000 mg by mouth every 6 (six) hours as needed (pain.).  [provider]  metFORMIN (GLUCOPHAGE-XR) 500 MG 24 hr tablet Take 500 mg by mouth every morning. 03/21/20   [provider]  rosuvastatin (CRESTOR) 10 MG tablet Take 10 mg by mouth at bedtime.     [provider]  traMADol  (ULTRAM ) 50 MG tablet Take 1-2 tablets (50-100 mg total) by mouth every 6 (six) hours as needed for moderate pain (pain score 4-6). 02/27/24   Eletha Boas, MD  valsartan  (DIOVAN ) 320 MG tablet TAKE 1 TABLET(320 MG) BY MOUTH DAILY 05/28/24   Therisa Benton PARAS, NP    Family History Family History  Problem Relation Age of Onset    Cancer Paternal Grandmother    Anesthesia problems Neg Hx    Malignant hyperthermia Neg Hx    Hypotension Neg Hx    Pseudochol deficiency Neg Hx     Social History Social History   Tobacco Use   Smoking status: Never   Smokeless tobacco: Never  Vaping Use   Vaping status: Never Used  Substance Use Topics   Alcohol use: No   Drug use: No     Allergies   Patient has no known allergies.   Review of Systems Review of Systems Per HPI  Physical Exam Triage Vital Signs ED Triage Vitals  Encounter Vitals Group     BP 07/19/24 1250 (!) 172/78     Girls Systolic BP Percentile --      Girls Diastolic BP Percentile --      Boys Systolic BP Percentile --      Boys Diastolic BP Percentile --      Pulse Rate 07/19/24 1250 (!) 55     Resp 07/19/24 1250 16     Temp 07/19/24 1250 98.3 F (36.8 C)     Temp Source 07/19/24 1250 Oral     SpO2 07/19/24 1250 95 %     Weight --      Height --      Head Circumference --      Peak Flow --      Pain Score 07/19/24 1249 0     Pain Loc --      Pain Education --      Exclude from Growth Chart --    No data found.  Updated Vital Signs BP (!) 172/78 (BP Location: Right Arm)   Pulse (!) 55   Temp 98.3 F (36.8 C) (Oral)   Resp 16   SpO2 95%   Visual Acuity Right Eye Distance:   Left Eye Distance:   Bilateral Distance:    Right Eye Near:   Left Eye Near:    Bilateral Near:     Physical Exam Vitals and nursing note reviewed.  Constitutional:      General: She is not in acute distress.    Appearance: Normal appearance. She is not toxic-appearing.  HENT:     Mouth/Throat:     Mouth: Mucous membranes are moist.     Pharynx: Oropharynx is clear.  Pulmonary:     Effort: Pulmonary effort is normal. No respiratory distress.  Skin:    General: Skin is warm and dry.     Capillary Refill: Capillary refill takes less than 2 seconds.     Findings: Erythema present.     Comments: Erythematous macule to right cheek  approximately 1 x 1 cm.  No tenderness, fluctuance, induration, drainage, or warmth.  Neurological:     Mental Status: She is alert and oriented to person, place, and time.  Psychiatric:  Behavior: Behavior is cooperative.      UC Treatments / Results  Labs (all labs ordered are listed, but only abnormal results are displayed) Labs Reviewed - No data to display  EKG   Radiology No results found.  Procedures Procedures (including critical care time)  Medications Ordered in UC Medications - No data to display  Initial Impression / Assessment and Plan / UC Course  I have reviewed the triage vital signs and the nursing notes.  Pertinent labs & imaging results that were available during my care of the patient were reviewed by me and considered in my medical decision making (see chart for details).   Patient is mildly hypertensive in triage, otherwise vital signs are stable.  Patient is well-appearing.  Regarding the erythematous macule to her right cheek, unclear etiology.  Differentials include possible insect bite, contact dermatitis, among others.  Treat with triamcinolone  cream low-dose, oral antihistamines as needed.  Return and ER precautions discussed with patient.  The patient was given the opportunity to ask questions.  All questions answered to their satisfaction.  The patient is in agreement to this plan.   Final Clinical Impressions(s) / UC Diagnoses   Final diagnoses:  Localized skin eruption     Discharge Instructions      I am unsure what is causing the redness on your face.  You can start using the Kenalog  cream to help with redness and itching.  Do not use this for more than 1 week; if symptoms persist longer than 1 week or worsen, seek care in the emergency room.  In addition to the Kenalog  cream, you can take an oral antihistamine like Zyrtec , Claritin, or Allegra to help with itching.  Seek care in the emergency room if the area gets bigger, more  swollen, more painful, or a darkened center.     ED Prescriptions     Medication Sig Dispense Auth. Provider   triamcinolone  (KENALOG ) 0.025 % cream Apply 1 Application topically 2 (two) times daily. Apply up to twice daily as needed for redness/itching.  Do not use for more than 1 week. 15 g Chandra Harlene LABOR, NP      PDMP not reviewed this encounter.   Chandra Harlene LABOR, NP 07/19/24 1311

## 2024-07-19 NOTE — ED Triage Notes (Signed)
 Pt states possible insect bite to the right side of her face. States she woke up with it.

## 2024-08-28 ENCOUNTER — Ambulatory Visit: Payer: Self-pay

## 2024-08-28 NOTE — Telephone Encounter (Signed)
 [facility]. SCHEDULED FOR 08/29/24 FOR ONE TIME REFILL EDUCATED ON [facility]BILE BUS CLINIC AVAILABILITY  Copied from CRM #[id number]. Topic: Clinical - Medication Question >> Aug 28, 2024 11:21 AM Tinnie BROCKS wrote: Reason for CRM: Pt requesting call back to see what she should do regarding running out of metformin soon. Next avail new pt appt is not until 2/11 and she was added to wait list. She got emergency refill at pharmacy, but they only gave her 3 days she said. I did let her know she might be able to bring bottle to urgent care to get more and insurance generally covers 2 emergency refills per year. #[id number] Reason for Disposition  [1] Prescription refill request for ESSENTIAL medicine (i.e., likelihood of harm to patient if not taken) AND [2] triager unable to refill per department policy  Answer Assessment - Initial Assessment Questions 1. DRUG NAME: What medicine do you need to have refilled?     METFORMIN 500 MG DAILY 2. REFILLS REMAINING: How many refills are remaining? Notes: The label on the medicine or pill bottle will show how many refills are remaining. If there are no refills remaining, then a renewal may be needed.     0 3. EXPIRATION DATE: What is the expiration date? Note: The label states when the prescription will expire, and thus can no longer be refilled.)      4. PRESCRIBER: Who prescribed it? Note: The prescribing doctor or group is responsible for refill approvals..     WRITTEN BY PROVIDER AT [place], PRACTICE CLOSED 5. PHARMACY: Have you contacted your pharmacy (drugstore)? Note: Some pharmacies will contact the doctor (or NP/PA).      N/A 6. SYMPTOMS: Do you have any symptoms?     NONE 7. PREGNANCY: Is there any chance that you are pregnant? When was your last menstrual period?     NA  Protocols used: Medication Refill and Renewal Call-A-AH

## 2024-08-29 ENCOUNTER — Ambulatory Visit
Admission: EM | Admit: 2024-08-29 | Discharge: 2024-08-29 | Disposition: A | Discharge status: Home / Self Care | Source: Home / Self Care

## 2024-08-29 ENCOUNTER — Ambulatory Visit: Payer: Self-pay

## 2024-08-29 DIAGNOSIS — I10 Essential (primary) hypertension: Secondary | ICD-10-CM

## 2024-08-29 DIAGNOSIS — E119 Type 2 diabetes mellitus without complications: Secondary | ICD-10-CM | POA: Diagnosis not present

## 2024-08-29 DIAGNOSIS — Z7984 Long term (current) use of oral hypoglycemic drugs: Secondary | ICD-10-CM

## 2024-08-29 DIAGNOSIS — M545 Low back pain, unspecified: Secondary | ICD-10-CM

## 2024-08-29 MED ORDER — METFORMIN HCL ER 500 MG PO TB24: 500.0000 mg | ORAL_TABLET | Freq: Every morning | ORAL | 1 refills | Status: AC

## 2024-08-29 MED ORDER — VALSARTAN 320 MG PO TABS: 320.0000 mg | ORAL_TABLET | Freq: Every day | ORAL | 1 refills | Status: AC

## 2024-08-29 NOTE — Discharge Instructions (Signed)
 I have refilled your medications to bridge you until your primary care appointment.  For your back pain, continue Tylenol  as needed, and you may use muscle rubs, heat, massage, stretches.  Follow-up with primary care if worsening or not resolving.

## 2024-08-29 NOTE — ED Triage Notes (Addendum)
 Pt reports needing a refill on metformin and valsartan  has an appointment with primary in February, pt states she has taken her lst dose this morning.

## 2024-08-29 NOTE — ED Provider Notes (Signed)
 " [facility]    CSN: [id number] Arrival date & time: 08/29/24  1329      History   Chief Complaint No chief complaint on file.   HPI Ann Vasquez is a 78 y.o. female.   Patient presenting today requesting refills on metformin and valsartan  to bridge her until her new primary care appointment on 08/09/2025.  She denies concerns or side effects with the medications or any symptoms such as chest pain, shortness of breath, headaches, dizziness, hypoglycemic episodes, polyphasia, polydipsia, polyuria.  She is also complaining of some ongoing issues with mid low back pain worse when she wakes up in the morning.  Seems to improve while she is up and moving throughout the day.  Denies injury, numbness, tingling, weakness, radiation down legs, bowel or bladder incontinence, saddle anesthesias.  Taking Tylenol  with good temporary benefit.    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/08/2012   Shoulder pain 11/17/2011   Shoulder stiffness 11/17/2011   Muscle weakness (generalized) 11/17/2011   S/P shoulder surgery 11/01/2011   Biceps tendon rupture, proximal 10/25/2011   Rotator cuff tear, left 10/25/2011   Rotator cuff tear 10/05/2011   Bursitis of shoulder, left 09/28/2011   Back pain 01/26/2011   IMPINGEMENT SYNDROME 09/29/2009   RUPTURE ROTATOR CUFF 09/29/2009   FIBROIDS, UTERUS 06/27/2009   DM 06/27/2009   HYPERLIPIDEMIA 06/27/2009   Other iron deficiency anemia 06/27/2009   GERD 06/27/2009    Past Surgical History:  Procedure Laterality Date    COLONOSCOPY WITH PROPOFOL  N/A 05/27/2020   Procedure: COLONOSCOPY WITH PROPOFOL ;  Surgeon: Eartha Angelia Sieving, MD;  Location: [facility];  Service: Gastroenterology;  Laterality: N/A;  815   EYE SURGERY Right    detached retina   PARATHYROIDECTOMY Left 02/27/2024   Procedure: PARATHYROIDECTOMY;  Surgeon: Eletha Boas, MD;  Location: [facility];  Service: General;  Laterality: Left;  left inferior parathyroidectomy   POLYPECTOMY  05/27/2020   Procedure: POLYPECTOMY;  Surgeon: Eartha Angelia Sieving, MD;  Location: [facility];  Service: Gastroenterology;;   right shoulder  2011   rotator cuff repair, Margrette, [facility]   SHOULDER OPEN ROTATOR CUFF REPAIR  10/29/2011   Procedure: left ROTATOR CUFF REPAIR SHOULDER OPEN;  Surgeon: Taft Margrette, MD;  Location: [facility];  Service: Orthopedics;  Laterality: Left;   WISDOM TOOTH EXTRACTION Right 05/19/2021    OB History   No obstetric history on file.      Home Medications    Prior to Admission medications  Medication Sig Start Date End Date Taking? Authorizing Provider  ACCU-CHEK AVIVA PLUS test strip daily. 09/07/23   [provider]  Accu-Chek Softclix Lancets lancets daily. 09/07/23   [provider]  acetaminophen  (TYLENOL ) 500 MG tablet Take 500-1,000 mg by mouth every 6 (six) hours as needed (pain.).    [provider]  metFORMIN (GLUCOPHAGE-XR) 500 MG 24 hr tablet Take 1 tablet (500 mg total) by mouth every morning. 08/29/24   Stuart Vernell Norris, PA-C  rosuvastatin (CRESTOR) 10  MG tablet Take 10 mg by mouth at bedtime.     [provider]  traMADol  (ULTRAM ) 50 MG tablet Take 1-2 tablets (50-100 mg total) by mouth every 6 (six) hours as needed for moderate pain (pain score 4-6). 02/27/24   Eletha Boas, MD  triamcinolone  (KENALOG ) 0.025 % cream Apply 1 Application topically 2 (two) times daily. Apply up to twice daily as needed for redness/itching.  Do not use for more than 1 week. 07/19/24    Chandra Harlene LABOR, NP  valsartan  (DIOVAN ) 320 MG tablet Take 1 tablet (320 mg total) by mouth daily. 08/29/24   Stuart Vernell Norris, PA-C    Family History Family History  Problem Relation Age of Onset   Cancer Paternal Grandmother    Anesthesia problems Neg Hx    Malignant hyperthermia Neg Hx    Hypotension Neg Hx    Pseudochol deficiency Neg Hx     Social History Social History[1]   Allergies   Patient has no known allergies.   Review of Systems Review of Systems PER HPI  Physical Exam Triage Vital Signs ED Triage Vitals  Encounter Vitals Group     BP 08/29/24 1353 (!) 179/70     Girls Systolic BP Percentile --      Girls Diastolic BP Percentile --      Boys Systolic BP Percentile --      Boys Diastolic BP Percentile --      Pulse Rate 08/29/24 1353 67     Resp 08/29/24 1353 18     Temp 08/29/24 1353 98.2 F (36.8 C)     Temp Source 08/29/24 1353 Oral     SpO2 08/29/24 1353 95 %     Weight --      Height --      Head Circumference --      Peak Flow --      Pain Score 08/29/24 1354 0     Pain Loc --      Pain Education --      Exclude from Growth Chart --    No data found.  Updated Vital Signs BP (!) 179/70 (BP Location: Right Arm)   Pulse 67   Temp 98.2 F (36.8 C) (Oral)   Resp 18   SpO2 95%   Visual Acuity Right Eye Distance:   Left Eye Distance:   Bilateral Distance:    Right Eye Near:   Left Eye Near:    Bilateral Near:     Physical Exam Vitals and nursing note reviewed.  Constitutional:      Appearance: Normal appearance. She is not ill-appearing.  HENT:     Head: Atraumatic.     Mouth/Throat:     Mouth: Mucous membranes are moist.  Eyes:     Extraocular Movements: Extraocular movements intact.     Conjunctiva/sclera: Conjunctivae normal.  Cardiovascular:     Rate and Rhythm: Normal rate and regular rhythm.     Heart sounds: Normal heart sounds.  Pulmonary:     Effort: Pulmonary effort is normal.     Breath sounds:  Normal breath sounds.  Musculoskeletal:        General: Tenderness present. No swelling or signs of injury. Normal range of motion.     Cervical back: Normal range of motion and neck supple.     Comments: Bilateral lumbar paraspinal muscles tender to palpation.  No midline spinal tenderness to palpation diffusely.  Negative straight leg raise bilateral lower extremities.  Skin:  General: Skin is warm and dry.     Findings: No erythema.  Neurological:     Mental Status: She is alert and oriented to person, place, and time.     Motor: No weakness.     Gait: Gait normal.     Comments: Bilateral lower extremities neurovascularly intact  Psychiatric:        Mood and Affect: Mood normal.        Thought Content: Thought content normal.        Judgment: Judgment normal.      UC Treatments / Results  Labs (all labs ordered are listed, but only abnormal results are displayed) Labs Reviewed - No data to display  EKG   Radiology No results found.  Procedures Procedures (including critical care time)  Medications Ordered in UC Medications - No data to display  Initial Impression / Assessment and Plan / UC Course  I have reviewed the triage vital signs and the nursing notes.  Pertinent labs & imaging results that were available during my care of the patient were reviewed by me and considered in my medical decision making (see chart for details).     Hypertensive in triage, otherwise vital signs reassuring.  She is well-appearing and in no acute distress.  Valsartan  and metformin refilled, follow-up as scheduled with new PCP on 10/10/2024.  Regarding her back pain, suspect arthritic in nature.  Discussed Tylenol , supportive over-the-counter medications and home care.  Follow-up for worsening or unresolving symptoms.  Red flag findings today.  Final Clinical Impressions(s) / UC Diagnoses   Final diagnoses:  Type 2 diabetes mellitus without complication, without long-term current  use of insulin  (HCC)  Essential hypertension  Acute bilateral low back pain without sciatica     Discharge Instructions      I have refilled your medications to bridge you until your primary care appointment.  For your back pain, continue Tylenol  as needed, and you may use muscle rubs, heat, massage, stretches.  Follow-up with primary care if worsening or not resolving.    ED Prescriptions     Medication Sig Dispense Auth. Provider   metFORMIN (GLUCOPHAGE-XR) 500 MG 24 hr tablet Take 1 tablet (500 mg total) by mouth every morning. 30 tablet Stuart Vernell Norris, PA-C   valsartan  (DIOVAN ) 320 MG tablet Take 1 tablet (320 mg total) by mouth daily. 30 tablet Stuart Vernell Norris, [place]      PDMP not reviewed this encounter.    [1]  Social History Tobacco Use   Smoking status: Never   Smokeless tobacco: Never  Vaping Use   Vaping status: Never Used  Substance Use Topics   Alcohol use: No   Drug use: No     Stuart Vernell Norris, PA-C 08/29/24 1520  "

## 2024-09-07 ENCOUNTER — Ambulatory Visit
Admission: EM | Admit: 2024-09-07 | Discharge: 2024-09-07 | Disposition: A | Discharge status: Home / Self Care | Attending: Physician Assistant | Admitting: Physician Assistant

## 2024-09-07 DIAGNOSIS — J111 Influenza due to unidentified influenza virus with other respiratory manifestations: Secondary | ICD-10-CM

## 2024-09-07 DIAGNOSIS — J069 Acute upper respiratory infection, unspecified: Secondary | ICD-10-CM | POA: Diagnosis not present

## 2024-09-07 DIAGNOSIS — R051 Acute cough: Secondary | ICD-10-CM | POA: Diagnosis not present

## 2024-09-07 DIAGNOSIS — I10 Essential (primary) hypertension: Secondary | ICD-10-CM | POA: Diagnosis not present

## 2024-09-07 LAB — POCT INFLUENZA A/B
Influenza A, POC: NEGATIVE
Influenza B, POC: NEGATIVE

## 2024-09-07 LAB — POC SOFIA SARS ANTIGEN FIA: SARS Coronavirus 2 Ag: NEGATIVE

## 2024-09-07 MED ORDER — OSELTAMIVIR PHOSPHATE 30 MG PO CAPS: 30.0000 mg | ORAL_CAPSULE | Freq: Two times a day (BID) | ORAL | 0 refills | Status: AC

## 2024-09-07 MED ORDER — ACETAMINOPHEN 325 MG PO TABS
975.0000 mg | ORAL_TABLET | Freq: Once | ORAL | Status: AC
  Administered 2024-09-07: 975 mg via ORAL

## 2024-09-07 MED ORDER — OSELTAMIVIR PHOSPHATE 75 MG PO CAPS: 75.0000 mg | ORAL_CAPSULE | Freq: Once | ORAL | 0 refills | Status: AC

## 2024-09-07 MED ORDER — BENZONATATE 100 MG PO CAPS: 100.0000 mg | ORAL_CAPSULE | Freq: Three times a day (TID) | ORAL | 0 refills | Status: AC

## 2024-09-07 NOTE — Discharge Instructions (Signed)
 You tested negative for COVID and flu.  I suspect that you have flu but your test is falsely negative in clinic.  Start Tamiflu  by taking 75 mg today and then tomorrow start 30 mg twice daily for an additional 4 days.  If you develop any stomach upset or abnormal dreams stop the medication to be seen.  Use Tessalon  for cough.  I recommend over-the-counter medication such as Tylenol , Mucinex, Flonase for additional symptom relief.  Make sure that you rest and drink plenty fluid.  If you are not feeling better within 5 days please return.  If anything worsens you need to be seen immediately including chest pain, shortness of breath, worsening cough, high fever, weakness.  Your blood pressure is elevated.  If you have any chest pain, shortness of breath, headache, vision change, dizziness in setting of high blood pressure you need to go to the ER.  Avoid decongestants including DayQuil/NyQuil, NSAIDs (aspirin, ibuprofen /Advil , naproxen/Aleve), caffeine, sodium.  Monitor your blood pressure at home and if this is staying above 140/90 you should be reevaluated to consider adjustment of your medication.  Follow-up with your PCP soon as possible.

## 2024-09-07 NOTE — ED Provider Notes (Signed)
 " [facility]    CSN: [id number] Arrival date & time: 09/07/24  9077      History   Chief Complaint No chief complaint on file.   HPI Ann Vasquez is a 79 y.o. female.   Patient presents today with a 18-hour history of URI symptoms.  She reports cough, body aches, headache, fatigue/malaise, loose stool.  Denies any vomiting, measured fever, chest pain, shortness of breath.  She denies any known sick contacts.  She has not had COVID-19 in the past 90 days.  She has had COVID vaccines but not most recent booster.  She did not receive influenza vaccine this season.  She has been taking Tylenol  and cough medication with minimal improvement of symptoms.  Her last dose of Tylenol  was yesterday at 4 PM.  She denies any recent antibiotics or steroids.  Denies any known sick contacts.  She had difficulty sleeping last night because of the cough and feeling poorly.  Patient's blood pressure is elevated.  She did take her antihypertensive medication this morning.  She denies any recent NSAIDs, decongestants, caffeine, sodium.  She does have a mild headache but this is not the worst headache of her life and denies any sudden/thunderclap qualities of the headache.  She denies any chest pain, shortness of breath, dizziness, visual disturbance.    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/08/2012   Shoulder pain 11/17/2011   Shoulder stiffness 11/17/2011   Muscle weakness (generalized) 11/17/2011   S/P shoulder surgery 11/01/2011   Biceps tendon rupture, proximal 10/25/2011    Rotator cuff tear, left 10/25/2011   Rotator cuff tear 10/05/2011   Bursitis of shoulder, left 09/28/2011   Back pain 01/26/2011   IMPINGEMENT SYNDROME 09/29/2009   RUPTURE ROTATOR CUFF 09/29/2009   FIBROIDS, UTERUS 06/27/2009   DM 06/27/2009   HYPERLIPIDEMIA 06/27/2009   Other iron deficiency anemia 06/27/2009   GERD 06/27/2009    Past Surgical History:  Procedure Laterality Date   COLONOSCOPY WITH PROPOFOL  N/A 05/27/2020   Procedure: COLONOSCOPY WITH PROPOFOL ;  Surgeon: Eartha Angelia Sieving, MD;  Location: [facility];  Service: Gastroenterology;  Laterality: N/A;  815   EYE SURGERY Right    detached retina   PARATHYROIDECTOMY Left 02/27/2024   Procedure: PARATHYROIDECTOMY;  Surgeon: Eletha Boas, MD;  Location: [facility];  Service: General;  Laterality: Left;  left inferior parathyroidectomy   POLYPECTOMY  05/27/2020   Procedure: POLYPECTOMY;  Surgeon: Eartha Angelia Sieving, MD;  Location: [facility];  Service: Gastroenterology;;   right shoulder  2011   rotator cuff repair, [facility], [facility]   SHOULDER OPEN ROTATOR CUFF REPAIR  10/29/2011   Procedure: left ROTATOR CUFF REPAIR SHOULDER OPEN;  Surgeon: Taft Margrette, MD;  Location: [facility];  Service: Orthopedics;  Laterality: Left;   WISDOM TOOTH EXTRACTION Right 05/19/2021    OB History   No obstetric history on file.      Home Medications    Prior to Admission medications  Medication Sig Start Date End Date Taking? Authorizing  Provider  benzonatate  (TESSALON ) 100 MG capsule Take 1 capsule (100 mg total) by mouth every 8 (eight) hours. 09/07/24  Yes Antiono Ettinger, Rocky POUR, PA-C  oseltamivir  (TAMIFLU ) 30 MG capsule Take 1 capsule (30 mg total) by mouth 2 (two) times daily for 4 days. 09/07/24 09/11/24 Yes Lilliam Chamblee K, PA-C  oseltamivir  (TAMIFLU ) 75 MG capsule Take 1 capsule (75 mg total) by mouth once for 1 dose. 09/07/24 09/07/24 Yes Sayla Golonka K, PA-C  ACCU-CHEK AVIVA PLUS test strip daily. 09/07/23   [provider]   Accu-Chek Softclix Lancets lancets daily. 09/07/23   [provider]  acetaminophen  (TYLENOL ) 500 MG tablet Take 500-1,000 mg by mouth every 6 (six) hours as needed (pain.).    [provider]  metFORMIN  (GLUCOPHAGE -XR) 500 MG 24 hr tablet Take 1 tablet (500 mg total) by mouth every morning. 08/29/24   Stuart Vernell Norris, PA-C  rosuvastatin (CRESTOR) 10 MG tablet Take 10 mg by mouth at bedtime.     [provider]  traMADol  (ULTRAM ) 50 MG tablet Take 1-2 tablets (50-100 mg total) by mouth every 6 (six) hours as needed for moderate pain (pain score 4-6). 02/27/24   Eletha Boas, MD  triamcinolone  (KENALOG ) 0.025 % cream Apply 1 Application topically 2 (two) times daily. Apply up to twice daily as needed for redness/itching.  Do not use for more than 1 week. 07/19/24   Chandra Harlene LABOR, NP  valsartan  (DIOVAN ) 320 MG tablet Take 1 tablet (320 mg total) by mouth daily. 08/29/24   Stuart Vernell Norris, PA-C    Family History Family History  Problem Relation Age of Onset   Cancer Paternal Grandmother    Anesthesia problems Neg Hx    Malignant hyperthermia Neg Hx    Hypotension Neg Hx    Pseudochol deficiency Neg Hx     Social History Social History[1]   Allergies   Patient has no known allergies.   Review of Systems Review of Systems  Constitutional:  Positive for activity change and fatigue. Negative for appetite change and fever.  HENT:  Positive for congestion. Negative for sinus pressure, sneezing and sore throat.   Respiratory:  Positive for cough. Negative for shortness of breath.   Cardiovascular:  Negative for chest pain.  Gastrointestinal:  Positive for diarrhea. Negative for abdominal pain, nausea and vomiting.  Musculoskeletal:  Positive for arthralgias and myalgias.  Neurological:  Positive for headaches. Negative for dizziness and light-headedness.     Physical Exam Triage Vital Signs ED Triage Vitals  Encounter Vitals Group     BP  09/07/24 0947 (!) 183/67     Girls Systolic BP Percentile --      Girls Diastolic BP Percentile --      Boys Systolic BP Percentile --      Boys Diastolic BP Percentile --      Pulse Rate 09/07/24 0947 94     Resp 09/07/24 0947 16     Temp 09/07/24 0949 99.1 F (37.3 C)     Temp Source 09/07/24 0947 Oral     SpO2 09/07/24 0947 94 %     Weight --      Height --      Head Circumference --      Peak Flow --      Pain Score 09/07/24 0948 9     Pain Loc --      Pain Education --      Exclude from Growth Chart --    No data  found.  Updated Vital Signs BP (!) 168/78 (BP Location: Right Arm)   Pulse 94   Temp 99.1 F (37.3 C)   Resp 16   SpO2 94%   Visual Acuity Right Eye Distance:   Left Eye Distance:   Bilateral Distance:    Right Eye Near:   Left Eye Near:    Bilateral Near:     Physical Exam Vitals reviewed.  Constitutional:      General: She is awake. She is not in acute distress.    Appearance: Normal appearance. She is well-developed. She is not ill-appearing.     Comments: Very pleasant female appears stated age in no acute distress sitting comfortably in exam room  HENT:     Head: Normocephalic and atraumatic.     Right Ear: Tympanic membrane, ear canal and external ear normal. Tympanic membrane is not erythematous or bulging.     Left Ear: Tympanic membrane, ear canal and external ear normal. Tympanic membrane is not erythematous or bulging.     Nose:     Right Sinus: No maxillary sinus tenderness or frontal sinus tenderness.     Left Sinus: No maxillary sinus tenderness or frontal sinus tenderness.     Mouth/Throat:     Pharynx: Uvula midline. No oropharyngeal exudate or posterior oropharyngeal erythema.  Cardiovascular:     Rate and Rhythm: Normal rate and regular rhythm.     Pulses:          Radial pulses are 2+ on the right side and 2+ on the left side.     Heart sounds: Normal heart sounds, S1 normal and S2 normal. No murmur heard. Pulmonary:      Effort: Pulmonary effort is normal.     Breath sounds: Normal breath sounds. No wheezing, rhonchi or rales.     Comments: Clear to auscultation bilaterally Psychiatric:        Behavior: Behavior is cooperative.      UC Treatments / Results  Labs (all labs ordered are listed, but only abnormal results are displayed) Labs Reviewed  POCT INFLUENZA A/B  POC SOFIA SARS ANTIGEN FIA    EKG   Radiology No results found.  Procedures Procedures (including critical care time)  Medications Ordered in UC Medications  acetaminophen  (TYLENOL ) tablet 975 mg (975 mg Oral Given 09/07/24 1014)    Initial Impression / Assessment and Plan / UC Course  I have reviewed the triage vital signs and the nursing notes.  Pertinent labs & imaging results that were available during my care of the patient were reviewed by me and considered in my medical decision making (see chart for details).     Patient is well-appearing, afebrile, nontoxic, nontachycardic.  Given her clinical presentation I am concerned for influenza that she tested negative in clinic today.  She was negative for COVID so given she is within 48 hours of symptom onset with influenza-like illness we will cover with Tamiflu .  We did have to adjust her Tamiflu  dose based on her metabolic panel from 02/23/2024 with a creatinine of 0.76 and calculated creatinine clearance of 59.07 L/min so she was prescribed Tamiflu  75 mg x 1 today and then start 30 mg twice daily for an additional 4 days tomorrow (09/08/2024).  She was given Tessalon  for cough.  Recommend over-the-counter medication for symptom management including Mucinex, Flonase, Tylenol , nasal saline/sinus rinses.  If she is not feeling better within a few days she is to return for reevaluation.  Discussed that if she has  any worsening symptoms she needs to be seen emergently including chest pain, shortness of breath, high fever, weakness.  Strict return precautions given.  Excuse note  provided.  Her blood pressure was elevated today.  Initially she did have a headache but denied any thunderclap or severe quality to the headache and reports that she was feeling better by the end of the visit.  We discussed that it is reasonable to go to the ER but given she is improving through shared decision making she will monitor her blood pressure at home and follow-up with her primary care but agreed that if she develops severe sudden headache, worsening headache, chest pain, shortness of breath, vision change, weakness she will go to the ER.  Recommend she avoid decongestants, caffeine, sodium, NSAIDs.  Patient expressed understanding agreement treatment plan.  Final Clinical Impressions(s) / UC Diagnoses   Final diagnoses:  Acute cough  Influenza-like illness  Viral URI with cough  Elevated blood pressure reading in office with diagnosis of hypertension     Discharge Instructions      You tested negative for COVID and flu.  I suspect that you have flu but your test is falsely negative in clinic.  Start Tamiflu  by taking 75 mg today and then tomorrow start 30 mg twice daily for an additional 4 days.  If you develop any stomach upset or abnormal dreams stop the medication to be seen.  Use Tessalon  for cough.  I recommend over-the-counter medication such as Tylenol , Mucinex, Flonase for additional symptom relief.  Make sure that you rest and drink plenty fluid.  If you are not feeling better within 5 days please return.  If anything worsens you need to be seen immediately including chest pain, shortness of breath, worsening cough, high fever, weakness.  Your blood pressure is elevated.  If you have any chest pain, shortness of breath, headache, vision change, dizziness in setting of high blood pressure you need to go to the ER.  Avoid decongestants including DayQuil/NyQuil, NSAIDs (aspirin, ibuprofen /Advil , naproxen/Aleve), caffeine, sodium.  Monitor your blood pressure at home and if this  is staying above 140/90 you should be reevaluated to consider adjustment of your medication.  Follow-up with your PCP soon as possible.     ED Prescriptions     Medication Sig Dispense Auth. Provider   benzonatate  (TESSALON ) 100 MG capsule Take 1 capsule (100 mg total) by mouth every 8 (eight) hours. 21 capsule Gredmarie Delange K, PA-C   oseltamivir  (TAMIFLU ) 30 MG capsule Take 1 capsule (30 mg total) by mouth 2 (two) times daily for 4 days. 8 capsule Avyukt Cimo K, PA-C   oseltamivir  (TAMIFLU ) 75 MG capsule Take 1 capsule (75 mg total) by mouth once for 1 dose. 1 capsule Wayburn Shaler K, PA-C      PDMP not reviewed this encounter.    [1]  Social History Tobacco Use   Smoking status: Never   Smokeless tobacco: Never  Vaping Use   Vaping status: Never Used  Substance Use Topics   Alcohol use: No   Drug use: No     Sherrell Rocky POUR, PA-C 09/07/24 1103  "

## 2024-09-07 NOTE — ED Triage Notes (Signed)
 Pt reports she has a cough, body aches, and a headache x 1 day   Took tylenol  and cough meds

## 2024-10-10 ENCOUNTER — Ambulatory Visit
